# Patient Record
Sex: Female | Born: 1940 | Race: White | Hispanic: No | Smoking: Current every day smoker
Health system: Southern US, Community
[De-identification: ages and names within clinical notes are randomized; demographics above are authoritative.]

## PROBLEM LIST (undated history)

## (undated) ENCOUNTER — Ambulatory Visit (HOSPITAL_COMMUNITY): Admission: EM | Source: Home / Self Care

## (undated) DIAGNOSIS — C679 Malignant neoplasm of bladder, unspecified: Secondary | ICD-10-CM

## (undated) DIAGNOSIS — I10 Essential (primary) hypertension: Secondary | ICD-10-CM

## (undated) DIAGNOSIS — E039 Hypothyroidism, unspecified: Secondary | ICD-10-CM

## (undated) DIAGNOSIS — C801 Malignant (primary) neoplasm, unspecified: Secondary | ICD-10-CM

## (undated) DIAGNOSIS — N814 Uterovaginal prolapse, unspecified: Secondary | ICD-10-CM

## (undated) DIAGNOSIS — E7800 Pure hypercholesterolemia, unspecified: Secondary | ICD-10-CM

## (undated) DIAGNOSIS — R011 Cardiac murmur, unspecified: Secondary | ICD-10-CM

## (undated) DIAGNOSIS — I350 Nonrheumatic aortic (valve) stenosis: Secondary | ICD-10-CM

## (undated) DIAGNOSIS — K219 Gastro-esophageal reflux disease without esophagitis: Secondary | ICD-10-CM

## (undated) DIAGNOSIS — J449 Chronic obstructive pulmonary disease, unspecified: Secondary | ICD-10-CM

## (undated) HISTORY — PX: VAGINAL HYSTERECTOMY: SUR661

## (undated) HISTORY — DX: Pure hypercholesterolemia, unspecified: E78.00

## (undated) HISTORY — DX: Malignant (primary) neoplasm, unspecified: C80.1

## (undated) HISTORY — DX: Essential (primary) hypertension: I10

## (undated) HISTORY — PX: BLADDER TUMOR EXCISION: SHX238

## (undated) HISTORY — PX: BREAST EXCISIONAL BIOPSY: SUR124

## (undated) HISTORY — DX: Uterovaginal prolapse, unspecified: N81.4

---

## 1998-05-09 ENCOUNTER — Ambulatory Visit (HOSPITAL_COMMUNITY): Admission: RE | Admit: 1998-05-09 | Discharge: 1998-05-09 | Payer: Self-pay | Admitting: Gastroenterology

## 1999-09-11 ENCOUNTER — Other Ambulatory Visit: Admission: RE | Admit: 1999-09-11 | Discharge: 1999-09-11 | Payer: Self-pay | Admitting: Obstetrics and Gynecology

## 2000-04-13 ENCOUNTER — Encounter: Payer: Self-pay | Admitting: *Deleted

## 2000-04-13 ENCOUNTER — Ambulatory Visit (HOSPITAL_COMMUNITY): Admission: RE | Admit: 2000-04-13 | Discharge: 2000-04-13 | Payer: Self-pay | Admitting: *Deleted

## 2000-04-16 ENCOUNTER — Encounter: Payer: Self-pay | Admitting: Obstetrics and Gynecology

## 2000-04-16 ENCOUNTER — Ambulatory Visit (HOSPITAL_COMMUNITY): Admission: RE | Admit: 2000-04-16 | Discharge: 2000-04-16 | Payer: Self-pay | Admitting: Obstetrics and Gynecology

## 2001-08-17 ENCOUNTER — Encounter: Payer: Self-pay | Admitting: Urology

## 2001-08-17 ENCOUNTER — Encounter: Admission: RE | Admit: 2001-08-17 | Discharge: 2001-08-17 | Payer: Self-pay | Admitting: Urology

## 2001-08-19 ENCOUNTER — Encounter: Payer: Self-pay | Admitting: Urology

## 2001-08-19 ENCOUNTER — Ambulatory Visit (HOSPITAL_COMMUNITY): Admission: RE | Admit: 2001-08-19 | Discharge: 2001-08-19 | Payer: Self-pay | Admitting: Urology

## 2001-08-23 ENCOUNTER — Encounter: Payer: Self-pay | Admitting: Urology

## 2001-09-01 ENCOUNTER — Encounter (INDEPENDENT_AMBULATORY_CARE_PROVIDER_SITE_OTHER): Payer: Self-pay | Admitting: Specialist

## 2001-09-01 ENCOUNTER — Inpatient Hospital Stay (HOSPITAL_COMMUNITY): Admission: RE | Admit: 2001-09-01 | Discharge: 2001-09-03 | Payer: Self-pay | Admitting: Urology

## 2001-09-15 ENCOUNTER — Ambulatory Visit (HOSPITAL_COMMUNITY): Admission: RE | Admit: 2001-09-15 | Discharge: 2001-09-15 | Payer: Self-pay | Admitting: Obstetrics and Gynecology

## 2001-09-15 ENCOUNTER — Encounter: Payer: Self-pay | Admitting: Obstetrics and Gynecology

## 2001-10-30 ENCOUNTER — Emergency Department (HOSPITAL_COMMUNITY): Admission: EM | Admit: 2001-10-30 | Discharge: 2001-10-30 | Payer: Self-pay | Admitting: Emergency Medicine

## 2001-10-30 ENCOUNTER — Encounter: Payer: Self-pay | Admitting: Urology

## 2002-01-23 ENCOUNTER — Other Ambulatory Visit: Admission: RE | Admit: 2002-01-23 | Discharge: 2002-01-23 | Payer: Self-pay | Admitting: Obstetrics and Gynecology

## 2002-02-09 ENCOUNTER — Encounter: Payer: Self-pay | Admitting: Gastroenterology

## 2002-02-09 ENCOUNTER — Encounter: Admission: RE | Admit: 2002-02-09 | Discharge: 2002-02-09 | Payer: Self-pay | Admitting: Gastroenterology

## 2002-02-10 ENCOUNTER — Ambulatory Visit (HOSPITAL_COMMUNITY): Admission: RE | Admit: 2002-02-10 | Discharge: 2002-02-10 | Payer: Self-pay | Admitting: Gastroenterology

## 2007-08-16 ENCOUNTER — Other Ambulatory Visit: Admission: RE | Admit: 2007-08-16 | Discharge: 2007-08-16 | Payer: Self-pay | Admitting: Obstetrics and Gynecology

## 2007-09-06 ENCOUNTER — Encounter: Admission: RE | Admit: 2007-09-06 | Discharge: 2007-09-06 | Payer: Self-pay | Admitting: Gastroenterology

## 2007-09-28 ENCOUNTER — Encounter: Admission: RE | Admit: 2007-09-28 | Discharge: 2007-09-28 | Payer: Self-pay | Admitting: Obstetrics and Gynecology

## 2008-09-18 ENCOUNTER — Ambulatory Visit: Payer: Self-pay | Admitting: Obstetrics and Gynecology

## 2008-09-19 ENCOUNTER — Ambulatory Visit: Payer: Self-pay | Admitting: Obstetrics and Gynecology

## 2008-09-28 ENCOUNTER — Encounter: Admission: RE | Admit: 2008-09-28 | Discharge: 2008-09-28 | Payer: Self-pay | Admitting: Obstetrics and Gynecology

## 2008-11-16 ENCOUNTER — Encounter: Admission: RE | Admit: 2008-11-16 | Discharge: 2008-11-16 | Payer: Self-pay | Admitting: Orthopedic Surgery

## 2008-12-11 ENCOUNTER — Ambulatory Visit (HOSPITAL_COMMUNITY): Admission: RE | Admit: 2008-12-11 | Discharge: 2008-12-11 | Payer: Self-pay | Admitting: Urology

## 2008-12-19 ENCOUNTER — Ambulatory Visit: Payer: Self-pay | Admitting: Obstetrics and Gynecology

## 2008-12-28 ENCOUNTER — Ambulatory Visit (HOSPITAL_BASED_OUTPATIENT_CLINIC_OR_DEPARTMENT_OTHER): Admission: RE | Admit: 2008-12-28 | Discharge: 2008-12-28 | Payer: Self-pay | Admitting: Urology

## 2009-09-19 ENCOUNTER — Encounter: Payer: Self-pay | Admitting: Obstetrics and Gynecology

## 2009-09-19 ENCOUNTER — Ambulatory Visit: Payer: Self-pay | Admitting: Obstetrics and Gynecology

## 2009-09-19 ENCOUNTER — Other Ambulatory Visit: Admission: RE | Admit: 2009-09-19 | Discharge: 2009-09-19 | Payer: Self-pay | Admitting: Obstetrics and Gynecology

## 2009-10-10 ENCOUNTER — Encounter: Admission: RE | Admit: 2009-10-10 | Discharge: 2009-10-10 | Payer: Self-pay | Admitting: Obstetrics and Gynecology

## 2010-09-11 ENCOUNTER — Ambulatory Visit (HOSPITAL_COMMUNITY): Admission: RE | Admit: 2010-09-11 | Discharge: 2010-09-11 | Payer: Self-pay | Admitting: Gastroenterology

## 2010-09-23 ENCOUNTER — Ambulatory Visit: Payer: Self-pay | Admitting: Obstetrics and Gynecology

## 2010-10-14 ENCOUNTER — Encounter
Admission: RE | Admit: 2010-10-14 | Discharge: 2010-10-14 | Payer: Self-pay | Source: Home / Self Care | Admitting: Obstetrics and Gynecology

## 2010-11-17 ENCOUNTER — Ambulatory Visit
Admission: RE | Admit: 2010-11-17 | Discharge: 2010-11-17 | Payer: Self-pay | Source: Home / Self Care | Attending: Obstetrics and Gynecology | Admitting: Obstetrics and Gynecology

## 2010-11-30 ENCOUNTER — Encounter: Payer: Self-pay | Admitting: Obstetrics and Gynecology

## 2010-12-02 ENCOUNTER — Ambulatory Visit (HOSPITAL_COMMUNITY)
Admission: RE | Admit: 2010-12-02 | Discharge: 2010-12-02 | Payer: Self-pay | Source: Home / Self Care | Attending: Obstetrics and Gynecology | Admitting: Obstetrics and Gynecology

## 2011-02-24 LAB — POCT I-STAT 4, (NA,K, GLUC, HGB,HCT)
Glucose, Bld: 88 mg/dL (ref 70–99)
Hemoglobin: 18 g/dL — ABNORMAL HIGH (ref 12.0–15.0)
Potassium: 3.9 mEq/L (ref 3.5–5.1)

## 2011-03-24 NOTE — Op Note (Signed)
NAMENORBERTA, STOBAUGH NO.:  [id number]   MEDICAL RECORD NO.:  [id number]          PATIENT TYPE:  OUT   LOCATION:  XRAY                         FACILITY:  [facility]   PHYSICIAN:  Sigmund I. Patsi Sears, M.D.DATE OF BIRTH:  15-May-1941   DATE OF PROCEDURE:  12/28/2008  DATE OF DISCHARGE:  12/11/2008                               OPERATIVE REPORT   PREOPERATIVE DIAGNOSIS:  Left ureteral obstruction.   POSTOPERATIVE DIAGNOSIS:  Left ureteral obstruction.   OPERATION:  Cystourethroscopy, cystogram, right retrograde pyelogram  with interpretation, injection of indigo carmine and Lasix.  Attempted  left ureteral catheterization.   SURGEON:  Dr. Patsi Sears.   ANESTHESIA:  General LMA.   PREPARATION:  After appropriate preanesthesia, the patient was brought  to the operating room and placed on the operating room table in the  dorsal supine position where general LMA anesthesia was introduced.  She  was then replaced in the dorsal lithotomy position where the pubis was  prepped with Betadine solution and draped in the usual fashion.   REVIEW OF HISTORY:  Mrs. 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  She  was treated postoperatively with Intron/BCG, to which she responded  well.  She has had yearly cystoscopies with no recurrence.  She has also  been treated for osteoporosis, low vitamin D levels as well.  The  patient recently has been seen by orthopedic surgery complaining of left  flank pain, with CT scan showing mild left hydronephrosis.  There was no  evidence of a stone or mass.  The patient had nuclear medicine renal  imaging, which showed mild distal ureteral drainage, but otherwise  normal kidneys.  She is now for cystoscopy and left retrograde  pyelogram.   DESCRIPTION OF PROCEDURE:  Cystogram was performed to rule out reflux,  and no evidence of reflux ws identified.  Cystoscopy was then  performed,  which showed no evidence of recurrent bladder tumor or bladder stone.  Right ureteral orifice identified, and right retrograde pyelogram was  performed which showed normal right ureter and normal renal pelvis.  However, I could not find the left ureteral orifice.  Indigo carmine was  given, Lasix was given, and over a 15-minute period cystoscopy was  accomplished, but no blue contrast was identified.  I was not able to  identify the left ureteral orifice with the 12 degrees lens, the 30  degrees lens, or the 70 degrees lens.  Therefore, I terminated the  procedure.  The patient will have a follow-up ultrasound, and possible  antegrade nephrostogram.  The patient was awakened and taken to the  recovery room in good condition.     Sigmund I. Patsi Sears, M.D.  Electronically Signed    SIT/MEDQ  D:  12/28/2008  T:  12/28/2008  Job:  [id number]

## 2011-03-27 NOTE — Discharge Summary (Signed)
[facility]  Patient:    Gina Harrell, Gina Harrell Visit Number: [id number] MRN: [id number]          Service Type: SUR Location: 3W 0348 01 Attending Physician:  Laqueta Jean Dictated by:   Vonzell Schlatter Patsi Sears, M.D. Admit Date:  09/01/2001 Discharge Date: 09/03/2001   CC:         Genene Churn. Sherin Quarry, M.D.   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  OPERATION:  10/24, cystourethroscopy, transurethral resection of large volume multiple bladder tumors.  ADDITIONAL DIAGNOSES: 1. History of tobacco abuse. 2. History of hysterectomy in 1986. 3. History of benign liver lesion.  HISTORY OF PRESENT ILLNESS:  [name] is a 70 year old single white female, para 3-3-0, seen on October 8 with a fourth episode of gross painless hematuria.  Cystoscopy showed large volume of bladder cancer.  CT scan showed an abnormal liver lesion, which was identified approximately 15 years ago by Dr. Sherin Quarry and Dr. Prudy Feeler.  Urine cytologies were negative. The patient is admitted after TUR of bladder tumor.  PHYSICAL EXAMINATION:  GENERAL:  A well-developed, well-nourished white female in no acute distress.  VITAL SIGNS:  Temperature 98.3, heart rate 61, respiratory rate 16, blood pressure 120/86, weight 101 pounds.  Remaining physical examination is as noted on H&P dictated on October 24.  ADMISSION LABORATORY DATA:  A white blood cell count of 8900, hemoglobin 15.3, hematocrit 43.8.  Serum sodium 137, potassium 4.2, chloride 106, CO2 25, BUN 18, creatinine 1.0.  Liver functions are normal.  Chest x-ray is negative. EKG shows normal sinus rhythm with left atrial enlargement.  HOSPITAL COURSE:  On the day of admission, the patient underwent transurethral resection of the prostate.  Because of her smoking history, she was placed on nicotine patches and Xanax for high  anxiety.  Patient desires to stay in the hospital until she is able to void; therefore, the Foley catheter is left in for 48 hours.  This will be removed, the patient will be allowed to be discharged following her urination.  She will be discharged on Urised one to two three times a day as she needs it for burning and spasm, Vicodin for pain, and Septra antibiotic.  She will be discharged in stable condition. Dictated by:   Vonzell Schlatter Patsi Sears, M.D. Attending Physician:  Laqueta Jean DD:  09/02/01 TD:  09/04/01 Job: [id number]

## 2011-03-27 NOTE — Op Note (Signed)
[facility]  Patient:    Gina Harrell, Gina Harrell Visit Number: [id number] MRN: [id number]          Service Type: SUR Location: 3W 0348 01 Attending Physician:  Lurene Shadow. Date: 09/01/01 Admit Date:  09/01/2001   CC:         Genene Churn. Sherin Quarry, M.D.   Operative Report  PREOPERATIVE DIAGNOSIS:  Bladder cancer.  POSTOPERATIVE DIAGNOSIS:  Bladder cancer.  OPERATION:  Cystourethroscopy, cold cup bladder biopsy, transurethral resection bladder tumor.  SURGEON:  Sigmund I. Patsi Sears, M.D.  ANESTHESIA:  General endotracheal.  PREPARATION:  After appropriate preanesthesia, the patient is brought to the operating room and placed upon the operating table in the dorsal supine position where a general endotracheal anesthesia was introduced.  She was then re-placed in the dorsal lithotomy position where the pubis was prepped with Betadine solution and draped in the usual fashion.  DESCRIPTION OF PROCEDURE:  Cystoscopy was accomplished; photodocumentation of tumors was accomplished, and cold cup bladder biopsies were taken.  There were two large 2 cm bladder tumors at the bladder base, and these were cold cup biopsied.  They were then multiple satellite tumors on the left lateral bladder base wall, and these were cauterized.  A very large bladder tumor was identified, completely encompassing the bladder neck from the 12 oclock position, around the 3 oclock position, to the 6 oclock position.  This was in greatest quantity at the 3 oclock position.  Using a regular resectoscope, as well as a special bladder wall loop, the tumor was resected.  The tumor definitely was into the urethra, on the left side, between 12 and 3 oclock. Following resection, cauterization of all areas was accomplished.  She was given indigo carmine, but no blue urine was seen by the end of the case.  It will be evaluated later.  Following this, a 24 three-way Foley  catheter was placed.  All chips were evacuated free from the bladder and continuous flow irrigation was required to keep the urine relatively clear.  Repeat cystoscopy was accomplished, and repeat cauterization was accomplished.  No large bleeding was identified. Again, a 24 three-way Foley catheter was placed with 30 cc in the balloon. The patient was then awakened and taken to the recovery room in good condition. Attending Physician:  Laqueta Jean DD:  09/01/01 TD:  09/02/01 Job: [id number]

## 2011-03-27 NOTE — Consult Note (Signed)
[facility]  Patient:    Gina Harrell, Gina Harrell Visit Number: [id number] MRN: [id number]          Service Type: EMS Location: ED Attending Physician:  Shelba Flake Dictated by:   Vonzell Schlatter Patsi Sears, M.D. Proc. Date: 10/30/01 Admit Date:  10/30/2001 Discharge Date: 10/30/2001   CC:         Genene Churn. Sherin Quarry, M.D.                          Consultation Report  SUBJECTIVE:  A 70 year old divorced white female with large volume non-invasive TCC of the bladder currently on combination research protocol with BCG and IL2.  Has complained of left-sided chest discomfort for a one week period.  Today the patient developed worsening of her left chest pain with shortness of breath.  There was no hemoptysis, no fever, no chills, no back pain, no flank pain.  The patient has a past history of 70 pack year history of smoking (currently is smoking at a much lower rate).  Her past history is otherwise noncontributory.  SOCIAL HISTORY:  The patient was recently fired from her job due to her illness and currently has had her unemployment challenged by her employer. She has had much anxiety over this.  OBJECTIVE:  CHEST:  The patient has clear chest with excellent breath sounds and normal chest excursion.  There is no rib tenderness.  There is pain underneath the left twelfth rib in the left upper quadrant.  The pain does not radiate and worsens with palpation.  Breath sounds are equal bilaterally.  COR:  S1, S2 normal without heaves, thrills, or murmurs.  ABDOMEN:  Soft, positive bowel sounds without organomegaly or masses.  No CVA pain.  No abdominal or pelvic pain.  LABORATORIES:  Blood gas studies on room air:  pH 7.39, O2 91, CO2 34.7, bicarbonate 20, CO2 21, O2 saturation 97%.  White blood cell count 7900, hemoglobin 13.8, hematocrit 40.0, platelet count 252,000.  Sodium 138, potassium 3.8, chloride 113, CO2 23, BUN 15, creatinine 0.8.  Liver  function tests within normal limits.  Glucose 92, calcium 8.9, albumin 4.3, total protein 7.1.  Chest CT is negative for acute PE.  Chest x-ray shows some hypervascularity consistent with tobacco use ______ COPD, but no acute changes.  EKG results are pending.  IMPRESSION:  Left lower sternal pain and pain just beneath the twelfth rib with coincidental shortness of breath today (?anxiety attack).  PLAN:  Patient is cleared for bladder wash chemotherapy tomorrow.  Will send a copy of this information to Dr. Sherin Quarry.  She will notify me or Dr. Sherin Quarry should her symptoms reoccur or worsen. Dictated by:   Vonzell Schlatter Patsi Sears, M.D. Attending Physician:  Shelba Flake DD:  10/30/01 TD:  10/31/01 Job: [id number]

## 2011-03-27 NOTE — H&P (Signed)
[facility]  Patient:    Gina Harrell, Gina Harrell Visit Number: [id number] MRN: [id number]          Service Type: SUR Location: 3W 0348 01 Attending Physician:  Laqueta Jean Dictated by:   Vonzell Schlatter Patsi Sears, M.D. Admit Date:  09/01/2001                           History and Physical  HISTORY OF PRESENT ILLNESS:  Ms. Flener is a 70 year old, single, white female, para 3-3-0, who was originally seen on August 16, 2001, because of her fourth episode of gross, painless hematuria.  The episodes began two months prior to being seen.  There was no history of kidney stones and the patient has absolutely no symptoms, except for occasional severe headache secondary to sinus infection.  Her review of systems is significant for occasional left temporal pain radiating to her left eye.  Cystoscopy was accomplished in the office, which showed a very large bladder tumor at the 12 oclock position just inside her bladder neck, as well as two large lesions at the base of the bladder.  PAST SURGICAL HISTORY:  Hysterectomy in 1996.  ALLERGIES:  None known.  SOCIAL HISTORY:  Tobacco:  A 66-pack-year history.  A CT and MR were accomplished, showing that the patient had a large amount of tumor in the bladder and possible tumor in the urethra as well.  A question of nodes was raised on the left lateral side, but could not be documented.  The liver had cysts and MR confirmation of cysts.  There was one area that could not be completely confirmed with a possible hemangioma.  It is noted that the patient had abnormality in the same location approximately 16 years ago.  This was evaluated by Elana Alm. 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  PHYSICAL EXAMINATION:  A thin white female in no acute distress.  VITAL SIGNS:  Blood pressure 164/80, pulse 72, respiratory rate 16.  HEENT:  PERRL.  EOM full.  NECK:  Supple, nontender.  No nodes.  CHEST:   Clear to P&A.  BREASTS:  Not indicated.  ABDOMEN:  Soft.  Positive bowel sounds.  Without organomegaly.  Without masses.  PELVIC:  Normal female BUS.  The vaginal examination shows no palpable masses. There is no uterus present.  There are no adnexal masses.  RECTAL:  Normal external rectum.  EXTREMITIES:  Without cyanosis or edema.  NEUROLOGIC:  Physiologic.  ADMITTING IMPRESSION:  Bladder cancer.  PLAN:  Admit via OR for transurethral resection of bladder tumor. Dictated by:   Vonzell Schlatter Patsi Sears, M.D. Attending Physician:  Laqueta Jean DD:  09/01/01 TD:  09/02/01 Job: [id number]

## 2011-07-22 ENCOUNTER — Ambulatory Visit
Admission: RE | Admit: 2011-07-22 | Discharge: 2011-07-22 | Disposition: A | Discharge status: Home / Self Care | Payer: MEDICARE | Source: Ambulatory Visit | Attending: Internal Medicine | Admitting: Internal Medicine

## 2011-07-22 ENCOUNTER — Other Ambulatory Visit: Payer: Self-pay | Admitting: Internal Medicine

## 2011-07-22 DIAGNOSIS — R1031 Right lower quadrant pain: Secondary | ICD-10-CM

## 2011-07-22 MED ORDER — IOHEXOL 300 MG/ML  SOLN: 100.0000 mL | Freq: Once | INTRAMUSCULAR | Status: AC | PRN

## 2011-09-28 ENCOUNTER — Encounter: Payer: Self-pay | Admitting: Gynecology

## 2011-09-28 DIAGNOSIS — C801 Malignant (primary) neoplasm, unspecified: Secondary | ICD-10-CM | POA: Insufficient documentation

## 2011-09-28 DIAGNOSIS — N814 Uterovaginal prolapse, unspecified: Secondary | ICD-10-CM | POA: Insufficient documentation

## 2011-09-28 DIAGNOSIS — Z8551 Personal history of malignant neoplasm of bladder: Secondary | ICD-10-CM | POA: Insufficient documentation

## 2011-10-07 ENCOUNTER — Encounter: Payer: Self-pay | Admitting: Obstetrics and Gynecology

## 2011-10-07 ENCOUNTER — Other Ambulatory Visit (HOSPITAL_COMMUNITY)
Admission: RE | Admit: 2011-10-07 | Discharge: 2011-10-07 | Disposition: A | Discharge status: Home / Self Care | Payer: Medicare Other | Source: Ambulatory Visit | Attending: Obstetrics and Gynecology | Admitting: Obstetrics and Gynecology

## 2011-10-07 ENCOUNTER — Ambulatory Visit (INDEPENDENT_AMBULATORY_CARE_PROVIDER_SITE_OTHER): Payer: Medicare Other | Admitting: Obstetrics and Gynecology

## 2011-10-07 VITALS — BP 120/78 | Ht 60.0 in | Wt 116.0 lb

## 2011-10-07 DIAGNOSIS — Z124 Encounter for screening for malignant neoplasm of cervix: Secondary | ICD-10-CM

## 2011-10-07 DIAGNOSIS — Z01419 Encounter for gynecological examination (general) (routine) without abnormal findings: Secondary | ICD-10-CM | POA: Insufficient documentation

## 2011-10-07 DIAGNOSIS — M810 Age-related osteoporosis without current pathological fracture: Secondary | ICD-10-CM

## 2011-10-07 DIAGNOSIS — N949 Unspecified condition associated with female genital organs and menstrual cycle: Secondary | ICD-10-CM

## 2011-10-07 DIAGNOSIS — N952 Postmenopausal atrophic vaginitis: Secondary | ICD-10-CM

## 2011-10-07 DIAGNOSIS — R102 Pelvic and perineal pain: Secondary | ICD-10-CM

## 2011-10-07 DIAGNOSIS — C679 Malignant neoplasm of bladder, unspecified: Secondary | ICD-10-CM

## 2011-10-07 MED ORDER — ERGOCALCIFEROL 1.25 MG (50000 UT) PO CAPS: 50000.0000 [IU] | ORAL_CAPSULE | ORAL | Status: DC

## 2011-10-07 NOTE — Progress Notes (Signed)
Subjective:     Patient ID: Gina Harrell, female   DOB: 1941-08-11, 70 y.o.   MRN: [id number]  HPIpatient came back to see me today for further followup. Earlier this year she had been complaining of right lower quadrant pain. Her PCP order a CT scan of her abdomen and pelvis. It did not show any significant problems. It comments that she had normal ovaries. The pain is actually gone away. It appeared to be related to constipation. The patient has been treated by me for her osteoporosis. She has had one injection of Reclast. She is due for her next one in January. She had no side effects from the injection. She had been followed since then and had no fracture. She fell once and did not fracture. She continues her calcium and vitamin D. She is significantly reduced her smoking. She is having no vaginal bleeding. She will schedule her mammogram for December. She is cancer free from her bladder cancer. She has vaginal dryness but is not sexually active. It is not require intervention.  Review of Systems  Constitutional: Negative.   HENT: Negative.   Eyes: Negative.   Respiratory: Negative.   Cardiovascular: Negative.   Gastrointestinal: Positive for abdominal pain and constipation.  Genitourinary:       Bladder cancer with 10 years of no recurrence.  Musculoskeletal: Negative.   Skin: Negative.   Neurological: Negative.   Hematological: Negative.   Psychiatric/Behavioral: Negative.        Objective:   Physical ExamHEENT: Within normal limits.Kennon Portela present Neck: No masses. Supraclavicular lymph nodes: Not enlarged. Breasts: Examined in both sitting and lying position. Symmetrical without skin changes or masses. Abdomen: Soft no masses guarding or rebound. No hernias. Pelvic: External within normal limits. BUS within normal limits. Vaginal examination shows poor estrogen effect, no cystocele enterocele or rectocele. Cervix and uterus absent. Adnexa within normal  limits. Rectovaginal confirmatory. Extremities within normal limits.      Assessment:     #1. Osteoporosis #2. Bladder cancer #3. Atrophic vaginitis #4. Abdominal pain   Plan:     Continue IV Reclast yearly. Continue calcium and vitamin D. Continue yearly mammograms.

## 2011-10-08 ENCOUNTER — Other Ambulatory Visit: Payer: Self-pay | Admitting: Obstetrics and Gynecology

## 2011-10-08 DIAGNOSIS — Z1231 Encounter for screening mammogram for malignant neoplasm of breast: Secondary | ICD-10-CM

## 2011-11-04 ENCOUNTER — Ambulatory Visit
Admission: RE | Admit: 2011-11-04 | Discharge: 2011-11-04 | Disposition: A | Discharge status: Home / Self Care | Payer: Medicare Other | Source: Ambulatory Visit | Attending: Obstetrics and Gynecology | Admitting: Obstetrics and Gynecology

## 2011-11-04 DIAGNOSIS — Z1231 Encounter for screening mammogram for malignant neoplasm of breast: Secondary | ICD-10-CM

## 2011-11-10 HISTORY — PX: OTHER SURGICAL HISTORY: SHX169

## 2011-11-10 HISTORY — PX: DOPPLER ECHOCARDIOGRAPHY: SHX263

## 2012-02-26 ENCOUNTER — Telehealth: Payer: Self-pay | Admitting: *Deleted

## 2012-02-26 NOTE — Telephone Encounter (Signed)
Lm for patient to call.  Benefits Reclast $200.

## 2012-02-29 ENCOUNTER — Other Ambulatory Visit: Payer: Self-pay | Admitting: *Deleted

## 2012-02-29 DIAGNOSIS — M810 Age-related osteoporosis without current pathological fracture: Secondary | ICD-10-CM

## 2012-02-29 NOTE — Telephone Encounter (Signed)
Patient informed benefits was actually $400 and patient ok with that.  Will do labs on 03/09/12 and we will schedule reclast according.

## 2012-03-09 ENCOUNTER — Other Ambulatory Visit: Payer: Medicare Other

## 2012-03-09 DIAGNOSIS — M810 Age-related osteoporosis without current pathological fracture: Secondary | ICD-10-CM

## 2012-03-09 LAB — CALCIUM: Calcium: 9.9 mg/dL (ref 8.4–10.5)

## 2012-03-16 NOTE — Telephone Encounter (Signed)
Patient set up for Reclast on May 22nd. Order faxed and labs faxed to Dr. Jacky Kindle.

## 2012-03-30 ENCOUNTER — Encounter (HOSPITAL_COMMUNITY)
Admission: RE | Admit: 2012-03-30 | Discharge: 2012-03-30 | Disposition: A | Discharge status: Home / Self Care | Payer: Medicare Other | Source: Ambulatory Visit | Attending: Obstetrics and Gynecology | Admitting: Obstetrics and Gynecology

## 2012-03-30 DIAGNOSIS — M810 Age-related osteoporosis without current pathological fracture: Secondary | ICD-10-CM | POA: Insufficient documentation

## 2012-03-30 MED ORDER — ZOLEDRONIC ACID 5 MG/100ML IV SOLN
5.0000 mg | Freq: Once | INTRAVENOUS | Status: AC
  Administered 2012-03-30: 5 mg via INTRAVENOUS
  Filled 2012-03-30: qty 100

## 2012-06-05 ENCOUNTER — Ambulatory Visit (INDEPENDENT_AMBULATORY_CARE_PROVIDER_SITE_OTHER): Payer: Medicare Other | Admitting: Internal Medicine

## 2012-06-05 VITALS — BP 140/86 | HR 69 | Temp 97.7°F | Resp 16 | Ht 59.75 in | Wt 117.0 lb

## 2012-06-05 DIAGNOSIS — I10 Essential (primary) hypertension: Secondary | ICD-10-CM | POA: Insufficient documentation

## 2012-06-05 DIAGNOSIS — H00039 Abscess of eyelid unspecified eye, unspecified eyelid: Secondary | ICD-10-CM

## 2012-06-05 MED ORDER — ACYCLOVIR 400 MG PO TABS: 400.0000 mg | ORAL_TABLET | ORAL | Status: AC

## 2012-06-05 MED ORDER — PREDNISONE 20 MG PO TABS: ORAL_TABLET | ORAL | Status: DC

## 2012-06-05 MED ORDER — DOXYCYCLINE HYCLATE 100 MG PO TABS: 100.0000 mg | ORAL_TABLET | Freq: Two times a day (BID) | ORAL | Status: AC

## 2012-06-05 NOTE — Progress Notes (Signed)
Subjective:    Patient ID: Gina Harrell, female    DOB: Oct 05, 1941, 71 y.o.   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 Uterine prolapse  . Cancer  . Osteoporosis  . HTN (hypertension)    -  Hyperlipidemia  On Mevacor and Maxzide Also on alprazolam when necessary  Review of SystemsNo fever chills or night sweats No oral pharyngeal or nasal symptoms     Objective:   Physical Exam Vital signs stable The right sits redness and significant swelling including lid and the malar area There is an early blister form as part of this/lower lid has several small spots that could be early blisters No pus No hordeolum No regional adenopathy Nose and throat are clear Conjunctiva is not injected There is no paresthesia found in the distribution of the trigeminal nerve on the right  Procedure= after Ophthaine and stained the conjunctiva was was found to be normal No areas of uptake and no dendritic changes          Assessment & Plan:  Problem #1 Cellulitis and swelling of lower lid and malar area  Differential diagnosis includes contact dermatitis, bacterial cellulitis, primary HSV infection, and zoster.  Viral culture Meds ordered this encounter  Medications         . predniSONE (DELTASONE) 20 MG tablet    Sig: 3/3/2/2/1/1 Single daily dose for 6 days    Dispense:  12 tablet    Refill:  0  . doxycycline (VIBRA-TABS) 100 MG tablet    Sig: Take 1 tablet (100 mg total) by mouth 2 (two) times daily.    Dispense:  20 tablet    Refill:  0  . acyclovir (ZOVIRAX) 400 MG tablet    Sig: Take 1 tablet (400 mg total) by mouth every 4 (four) hours while awake. 5 times a day    Dispense:  20  tablet    Refill:  0   Call with culture results/ophthalmology examination if not responding/followup here 24-48 hours

## 2012-06-08 ENCOUNTER — Telehealth: Payer: Self-pay | Admitting: *Deleted

## 2012-06-08 NOTE — Telephone Encounter (Signed)
Pt was calling in regards to her results of culture advised pt we only had a preliminary. She states that the medicines are giving her a severe headache.  Please advise.

## 2012-06-08 NOTE — Telephone Encounter (Signed)
Her headache may be coming from her eye infection If she is responding to treatment she should stop the doxycycline for 24 hours to see if the headache is due to that We can recheck her again Friday -not sure what my shift is

## 2012-06-09 NOTE — Telephone Encounter (Signed)
Pt reports that she has one more pred left and the rest of the Abx. Her eye has improved, but not 100% yet. Gave her instr's to DC doxy for 24 hrs and see if that is what was causing HA and to let us know if HA doesn't resolve and/or if eye does not continue to improve. Advised her that Dr Merla Riches can recheck her on Fri and she stated that she would like to try this first and see what happens, but will CB or RTC if needed.

## 2012-10-12 ENCOUNTER — Ambulatory Visit (INDEPENDENT_AMBULATORY_CARE_PROVIDER_SITE_OTHER): Payer: Medicare Other | Admitting: Obstetrics and Gynecology

## 2012-10-12 ENCOUNTER — Encounter: Payer: Self-pay | Admitting: Obstetrics and Gynecology

## 2012-10-12 VITALS — BP 120/78 | Ht 60.0 in | Wt 116.0 lb

## 2012-10-12 DIAGNOSIS — N952 Postmenopausal atrophic vaginitis: Secondary | ICD-10-CM

## 2012-10-12 DIAGNOSIS — N6019 Diffuse cystic mastopathy of unspecified breast: Secondary | ICD-10-CM

## 2012-10-12 DIAGNOSIS — E559 Vitamin D deficiency, unspecified: Secondary | ICD-10-CM

## 2012-10-12 DIAGNOSIS — C679 Malignant neoplasm of bladder, unspecified: Secondary | ICD-10-CM

## 2012-10-12 DIAGNOSIS — M810 Age-related osteoporosis without current pathological fracture: Secondary | ICD-10-CM

## 2012-10-12 NOTE — Progress Notes (Signed)
The patient came back today for further followup. She has been treated by [place] for osteoporosis. She first did 2 years of IV Reclast. She then took oral medicine for  6 months. She then did Zometa for 2 additional years. She has had no fractures. Her bone densities showed tremendous improvement. It has been one year since her last injection. She will go on drug holiday now because she requires significant dental work. The dentist  would like her to be off medication during the treatment. It sounds extensive it may take a year or more to complete. She continues on calcium and vitamin D 50,000 IUs every 14 days for vitamin D deficiency. Her D. Level and other labs are done through her PCP. The patient is not sexually active. She does have atrophic vaginitis but is asymptomatic. She had a vaginal hysterectomy in 1985 for uterine prolapse. She has always had normal Pap smears. She had a Pap smear in 2012. She is getting ready to schedule a mammogram. She does have dense breast tissue on mammogram. She is now 11 years since her diagnosis of bladder cancer. She is now on two-year followup. She is having no vaginal bleeding. She is having no pelvic pain.  ROS: 12 system review done. Pertinent positives above. Other positive is hypertension.  HEENT: Within normal limits.Kennon Portela present. Neck: No masses. Supraclavicular lymph nodes: Not enlarged. Breasts: Examined in both sitting and lying position. Symmetrical without skin changes or masses. Abdomen: Soft no masses guarding or rebound. No hernias. Pelvic: External within normal limits. BUS within normal limits. Vaginal examination shows poor  estrogen effect, no cystocele enterocele or rectocele. Cervix and uterus absent. Adnexa within normal limits. Rectovaginal confirmatory. Extremities within normal limits.  Assessment: #1. Osteoporosis #2. Bladder cancer #3. Atrophic vaginitis #4. Vitamin D deficiency #5. Dense breast tissue on mammogram.  Plan: We  had a long discussion of what to do medication wise for her osteoporosis. She certainly has had significant treatment. She certainly is at much less risk now. She will finish her dental work and get a followup bone density 2 years after her last IV Zometa. A decision can be made about further treatment then. She will get a 3-D mammogram because of dense breast tissue. She will continue her vitamin D. She will continue bladder cancer followup with Dr. Patsi Sears. Pap not done.The new Pap smear guidelines were discussed with the patient.

## 2012-10-12 NOTE — Patient Instructions (Signed)
Schedule 3-D mammogram 

## 2013-01-11 ENCOUNTER — Other Ambulatory Visit: Payer: Self-pay

## 2013-01-11 DIAGNOSIS — Z1231 Encounter for screening mammogram for malignant neoplasm of breast: Secondary | ICD-10-CM

## 2013-02-15 ENCOUNTER — Ambulatory Visit
Admission: RE | Admit: 2013-02-15 | Discharge: 2013-02-15 | Disposition: A | Discharge status: Home / Self Care | Payer: Medicare Other | Source: Ambulatory Visit

## 2013-02-15 DIAGNOSIS — Z1231 Encounter for screening mammogram for malignant neoplasm of breast: Secondary | ICD-10-CM

## 2013-09-15 ENCOUNTER — Ambulatory Visit: Payer: Medicare Other

## 2013-09-15 ENCOUNTER — Ambulatory Visit (INDEPENDENT_AMBULATORY_CARE_PROVIDER_SITE_OTHER): Payer: Medicare Other | Admitting: Emergency Medicine

## 2013-09-15 VITALS — BP 100/60 | HR 62 | Temp 97.9°F | Resp 16 | Ht 60.0 in | Wt 120.0 lb

## 2013-09-15 DIAGNOSIS — S81009A Unspecified open wound, unspecified knee, initial encounter: Secondary | ICD-10-CM

## 2013-09-15 DIAGNOSIS — S81002A Unspecified open wound, left knee, initial encounter: Secondary | ICD-10-CM

## 2013-09-15 DIAGNOSIS — M25562 Pain in left knee: Secondary | ICD-10-CM

## 2013-09-15 DIAGNOSIS — M25569 Pain in unspecified knee: Secondary | ICD-10-CM

## 2013-09-15 MED ORDER — MUPIROCIN 2 % EX OINT: 1.0000 "application " | TOPICAL_OINTMENT | Freq: Two times a day (BID) | CUTANEOUS | Status: DC

## 2013-09-15 NOTE — Progress Notes (Addendum)
Subjective:    Patient ID: Gina Harrell, female    DOB: [birth year], 72 y.o.   MRN: [id number]  HPI This chart was scribed for Viviann Spare Doc Mandala-MD, by Ladona Ridgel Day, Scribe. This patient was seen in room 12 and the patient's care was started at 3:44 PM.  HPI Comments: Patient was in good health until earlier this week when she ran into any glass table causing a wound to her left shin    Past Medical History  Diagnosis Date  . Uterine prolapse   . Cancer     Bladder cancer  . Hypertension   . Elevated cholesterol   . Osteoporosis     Past Surgical History  Procedure Laterality Date  . Vaginal hysterectomy    . Bladder tumor excision      Family History  Problem Relation Age of Onset  . Cancer Mother     Colon cancer  . Hypertension Mother   . Heart disease Father   . Breast cancer Maternal Aunt     Age 87's  . Diabetes Maternal Grandmother     History   Social History  . Marital Status: Divorced    Spouse Name: N/A    Number of Children: N/A  . Years of Education: N/A   Occupational History  . Not on file.   Social History Main Topics  . Smoking status: Current Every Day Smoker -- 0.50 packs/day    Types: Cigarettes  . Smokeless tobacco: Not on file  . Alcohol Use: 3.5 oz/week    7 drink(s) per week  . Drug Use: Not on file  . Sexual Activity: No   Other Topics Concern  . Not on file   Social History Narrative  . No narrative on file    No Known Allergies  Patient Active Problem List   Diagnosis Date Noted  . HTN (hypertension) 06/05/2012  . Uterine prolapse   . Cancer   . Osteoporosis     Results for orders placed in visit on 06/05/12  HERPES SIMPLEX VIRUS CULTURE      Result Value Range   Organism ID, Bacteria No Herpes Simplex Virus detected.      No diagnosis found.  No orders of the defined types were placed in this encounter.     Review of Systems  Skin: Positive for wound (laceration left anterior shin).   Triage Vitals: BP  100/60  Pulse 62  Temp(Src) 97.9 F (36.6 C) (Oral)  Resp 16  Ht 5' (1.524 m)  Wt 120 lb (54.432 kg)  BMI 23.44 kg/m2  SpO2 96%     Objective:   Physical Exam Physical Exam  Nursing note and vitals reviewed. Constitutional: Patient is oriented to person, place, and time. Patient appears well-developed and well-nourished. No distress.  HENT:  Head: Normocephalic and atraumatic.  Neck: Neck supple. No tracheal deviation present.  Cardiovascular: Normal rate, regular rhythm and normal heart sounds.   No murmur heard. Pulmonary/Chest: Effort normal and breath sounds normal. No respiratory distress. Patient has no wheezes. Patient has no rales.  Musculoskeletal: Normal range of motion.  Neurological: Patient is alert and oriented to person, place, and time.  Skin: Skin is warm and dry.  Psychiatric: Patient has a normal mood and affect. Patient's behavior is normal.   There is a 1 x 1" area of skin loss down to the subcutaneous tissue over the mid anterior shin there is no surrounding redness or streaking . Just inferior to this is a half centimeter  by half centimeter open area of skin loss. [facility] reading (PRIMARY) by  Dr.Damonte Frieson x-ray shows no fracture of the tibia or fibula   Results for orders placed in visit on 06/05/12  HERPES SIMPLEX VIRUS CULTURE      Result Value Range   Organism ID, Bacteria No Herpes Simplex Virus detected.         Assessment & Plan:  Wound of the shin is taken she will treat the area was silver water cleaning followed by application of Bactroban ointment

## 2013-09-18 LAB — WOUND CULTURE
Gram Stain: NONE SEEN
Organism ID, Bacteria: NO GROWTH

## 2014-01-11 ENCOUNTER — Ambulatory Visit (INDEPENDENT_AMBULATORY_CARE_PROVIDER_SITE_OTHER): Payer: Medicare Other | Admitting: Cardiovascular Disease

## 2014-01-11 ENCOUNTER — Encounter: Payer: Self-pay | Admitting: Cardiovascular Disease

## 2014-01-11 VITALS — BP 130/90 | HR 67 | Resp 16 | Ht 61.0 in | Wt 117.8 lb

## 2014-01-11 DIAGNOSIS — E7800 Pure hypercholesterolemia, unspecified: Secondary | ICD-10-CM | POA: Insufficient documentation

## 2014-01-11 DIAGNOSIS — I10 Essential (primary) hypertension: Secondary | ICD-10-CM

## 2014-01-11 DIAGNOSIS — F17200 Nicotine dependence, unspecified, uncomplicated: Secondary | ICD-10-CM

## 2014-01-11 DIAGNOSIS — I350 Nonrheumatic aortic (valve) stenosis: Secondary | ICD-10-CM | POA: Insufficient documentation

## 2014-01-11 DIAGNOSIS — I359 Nonrheumatic aortic valve disorder, unspecified: Secondary | ICD-10-CM

## 2014-01-11 DIAGNOSIS — Z720 Tobacco use: Secondary | ICD-10-CM | POA: Insufficient documentation

## 2014-01-11 NOTE — Assessment & Plan Note (Signed)
Strongly encouraged to quit smoking completely

## 2014-01-11 NOTE — Assessment & Plan Note (Signed)
Her echocardiogram in March of 2013 showed mild aortic valve calcification with particular immobility of the non-coronary cusp and a peak transaortic valve gradient of 22 mm Hg, mean gradient 9 mm Hg, calculated aortic valve area 1.2 cm square, mild aortic insufficiency. Her symptoms have not changed since then and it is unlikely that her aortic valve stenosis has progressed to hemodynamically significant degree. Her aortic stenosis is unlikely the cause of the dyspnea. I suspect this is related to smoking related chronic lung disease.

## 2014-01-11 NOTE — Patient Instructions (Signed)
Your physician recommends that you schedule a follow-up appointment in: ONE YEAR 

## 2014-01-11 NOTE — Progress Notes (Signed)
Patient ID: Gina Harrell, female   DOB: 1941-04-19, 73 y.o.   MRN: [id number]      Reason for office visit Aortic stenosis  Gina Harrell returns for routine followup. She has mild aortic stenosis and mild aortic insufficiency, systemic hypertension and hyperlipidemia. She continues to smoke but has cut down to less than half a pack of cigarettes a day.  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 She continues to describe a similar degree of shortness of breath on exertion (NYHA class II. She becomes a little winded when she climbs the 14 steps in her house and had to stop halfway through mopping her kitchen.  She had an unusual event in May of 2014 when she had a splitting headache followed by blurry vision in her right eye. An MRI of her brain did not show any acute findings but showed atrophic brain changes, particularly circumscribed to the vermis and general to the posterior fossa. Her neurological complaints have resolved completely and have not recurred.   No Known Allergies  Current Outpatient Prescriptions  Medication Sig Dispense Refill  . ALPRAZolam (XANAX) 0.5 MG tablet Take 0.5 mg by mouth at bedtime as needed.        Marland Kitchen aspirin 81 MG tablet Take 81 mg by mouth daily.        Marland Kitchen levothyroxine (SYNTHROID, LEVOTHROID) 25 MCG tablet Take 25 mcg by mouth daily.      Marland Kitchen lovastatin (MEVACOR) 20 MG tablet Take 40 mg by mouth daily.       . pantoprazole (PROTONIX) 40 MG tablet Take 40 mg by mouth daily as needed.       . triamterene-hydrochlorothiazide (MAXZIDE-25) 37.5-25 MG per tablet Take 1 tablet by mouth daily.       No current facility-administered medications for this visit.    Past Medical History  Diagnosis Date  . Uterine prolapse   . Cancer     Bladder cancer  . Hypertension   . Elevated cholesterol   . Osteoporosis     Past Surgical History  Procedure Laterality Date  . Vaginal hysterectomy    . Bladder tumor excision      Family  History  Problem Relation Age of Onset  . Cancer Mother     Colon cancer  . Hypertension Mother   . Heart disease Father   . Breast cancer Maternal Aunt     Age 18's  . Diabetes Maternal Grandmother     History   Social History  . Marital Status: Divorced    Spouse Name: N/A    Number of Children: N/A  . Years of Education: N/A   Occupational History  . Not on file.   Social History Main Topics  . Smoking status: Current Every Day Smoker -- 0.50 packs/day    Types: Cigarettes  . Smokeless tobacco: Not on file  . Alcohol Use: 3.5 oz/week    7 drink(s) per week  . Drug Use: Not on file  . Sexual Activity: No   Other Topics Concern  . Not on file   Social History Narrative  . No narrative on file    Review of systems: The patient specifically denies any chest pain at rest or with exertion, dyspnea at rest, orthopnea, paroxysmal nocturnal dyspnea, syncope, palpitations, focal neurological deficits, intermittent claudication, lower extremity edema, unexplained weight gain, cough, hemoptysis or wheezing.  The patient also denies abdominal pain, nausea, vomiting, dysphagia, diarrhea, constipation, polyuria, polydipsia, dysuria, hematuria, frequency, urgency, abnormal  bleeding or bruising, fever, chills, unexpected weight changes, mood swings, change in skin or hair texture, change in voice quality, auditory or visual problems, allergic reactions or rashes, new musculoskeletal complaints other than usual "aches and pains".   PHYSICAL EXAM BP 130/90  Pulse 67  Ht 5\' 1"  (1.549 m)  Wt 53.434 kg (117 lb 12.8 oz)  BMI 22.27 kg/m2  General: Alert, oriented x3, no distress Head: no evidence of trauma, PERRL, EOMI, no exophtalmos or lid lag, no myxedema, no xanthelasma; normal ears, nose and oropharynx Neck: normal jugular venous pulsations and no hepatojugular reflux; brisk carotid pulses without delay and soft bilateral carotid bruits Chest: clear to auscultation, no signs of  consolidation by percussion or palpation, normal fremitus, symmetrical and full respiratory excursions Cardiovascular: normal position and quality of the apical impulse, regular rhythm, normal first and second heart sounds, no  rubs or gallops, early peaking 2/6 systolic ejection murmur in the aortic focus radiating to the carotids, no diastolic murmur Abdomen: no tenderness or distention, no masses by palpation, no abnormal pulsatility or arterial bruits, normal bowel sounds, no hepatosplenomegaly Extremities: no clubbing, cyanosis or edema; 2+ radial, ulnar and brachial pulses bilaterally; 2+ right femoral, posterior tibial and dorsalis pedis pulses; 2+ left femoral, posterior tibial and dorsalis pedis pulses; no subclavian or femoral bruits Neurological: grossly nonfocal   EKG: NSR, question of left atrial abnormality, nonspecific T-wave flattening in the inferolateral   Lipid Panel  Total cholesterol 213, crit was is 100, HDL 57, LDL 136(one year ago)  BMET    Component Value Date/Time   NA 138 12/28/2008 0807   K 3.9 12/28/2008 0807   GLUCOSE 88 12/28/2008 0807   CREATININE 1.18* 03/09/2012 1012   CALCIUM 9.9 03/09/2012 1012     ASSESSMENT AND PLAN No problem-specific assessment & plan notes found for this encounter.  Orders Placed This Encounter  Procedures  . EKG 12-Lead   Meds ordered this encounter  Medications  . levothyroxine (SYNTHROID, LEVOTHROID) 25 MCG tablet    Sig: Take 25 mcg by mouth daily.    Junious Silk, MD, [facility] [phone] office [phone] pager

## 2014-01-11 NOTE — Assessment & Plan Note (Signed)
We'll get her most recent labs before deciding whether or not her medication needs adjustment. Note that she has a pretty good HDL cholesterol. He does not have a known vascular disease. Her carotid bruit seem to be primarily related to radiation from the chest, with mild fibrous plaque only seen on ultrasonography

## 2014-01-11 NOTE — Assessment & Plan Note (Signed)
Good control

## 2014-01-16 ENCOUNTER — Other Ambulatory Visit: Payer: Self-pay

## 2014-01-16 DIAGNOSIS — Z1231 Encounter for screening mammogram for malignant neoplasm of breast: Secondary | ICD-10-CM

## 2014-02-16 ENCOUNTER — Ambulatory Visit: Payer: Medicare Other

## 2014-02-23 ENCOUNTER — Ambulatory Visit: Payer: Medicare Other

## 2014-03-09 ENCOUNTER — Ambulatory Visit
Admission: RE | Admit: 2014-03-09 | Discharge: 2014-03-09 | Disposition: A | Discharge status: Home / Self Care | Payer: Medicare Other | Source: Ambulatory Visit

## 2014-03-09 ENCOUNTER — Encounter (INDEPENDENT_AMBULATORY_CARE_PROVIDER_SITE_OTHER): Payer: Self-pay

## 2014-03-09 DIAGNOSIS — Z1231 Encounter for screening mammogram for malignant neoplasm of breast: Secondary | ICD-10-CM

## 2014-03-18 ENCOUNTER — Ambulatory Visit: Payer: Medicare Other

## 2014-03-18 ENCOUNTER — Ambulatory Visit (INDEPENDENT_AMBULATORY_CARE_PROVIDER_SITE_OTHER): Payer: Medicare Other | Admitting: Family Medicine

## 2014-03-18 VITALS — BP 124/72 | HR 61 | Temp 97.6°F | Resp 20 | Ht 59.5 in | Wt 119.4 lb

## 2014-03-18 DIAGNOSIS — M25572 Pain in left ankle and joints of left foot: Secondary | ICD-10-CM

## 2014-03-18 DIAGNOSIS — S81802A Unspecified open wound, left lower leg, initial encounter: Secondary | ICD-10-CM

## 2014-03-18 DIAGNOSIS — S8012XA Contusion of left lower leg, initial encounter: Secondary | ICD-10-CM

## 2014-03-18 DIAGNOSIS — M25579 Pain in unspecified ankle and joints of unspecified foot: Secondary | ICD-10-CM

## 2014-03-18 DIAGNOSIS — S81009A Unspecified open wound, unspecified knee, initial encounter: Secondary | ICD-10-CM

## 2014-03-18 DIAGNOSIS — IMO0002 Reserved for concepts with insufficient information to code with codable children: Secondary | ICD-10-CM

## 2014-03-18 DIAGNOSIS — S91009A Unspecified open wound, unspecified ankle, initial encounter: Secondary | ICD-10-CM

## 2014-03-18 DIAGNOSIS — S8010XA Contusion of unspecified lower leg, initial encounter: Secondary | ICD-10-CM

## 2014-03-18 DIAGNOSIS — T148XXA Other injury of unspecified body region, initial encounter: Secondary | ICD-10-CM

## 2014-03-18 DIAGNOSIS — S81809A Unspecified open wound, unspecified lower leg, initial encounter: Secondary | ICD-10-CM

## 2014-03-18 NOTE — Progress Notes (Addendum)
Subjective:  This chart was scribed for Meredith Staggers, MD by Quintella Reichert, Scribe.  This patient was seen in [facility] Room 4 and the patient's care was started at 2:25 PM.   Patient ID: Gina Harrell, female    DOB: March 04, 1941, 73 y.o.   MRN: [id number]  Chief Complaint  Patient presents with  . left ankle injury    dropped a concrete block fell on her left  lower leg last night at work.  left ankle is swollen.      HPI  Gina Harrell is a 73 y.o. female PCP: ARONSON,RICHARD A, MD   Pt presents with a a left lower leg and ankle injury sustained last night.  She reports that she was at her work at a [employer] and was reaching for a box when a large concrete cinder block fell and slid down her left lower leg.  She sustained an abrasion and skin tear to the front of her leg and developed immediate onset of constant moderate soreness to that area.  She washed the area with water and soap and continued working for the rest of the day.  After work she went to [facility] and was told she would likely be okay to wait until today before being seen.  Last night she noticed pain and swelling to the left ankle.  She thinks the block may have slid into her ankle but it did not fall directly onto it.  She has been able to ambulate and bear weight, with some pain.  Patient also placed petroleum jelly on her wound last night.  She denies prior h/o ankle fracture.  She takes aspirin.  On Reclast for osteoporosis.   Patient Active Problem List   Diagnosis Date Noted  . Aortic stenosis 01/11/2014  . Hypercholesteremia 01/11/2014  . Tobacco abuse 01/11/2014  . HTN (hypertension) 06/05/2012  . Uterine prolapse   . Cancer   . Osteoporosis     Past Medical History  Diagnosis Date  . Uterine prolapse   . Cancer     Bladder cancer  . Hypertension   . Elevated cholesterol   . Osteoporosis     Past Surgical History  Procedure Laterality Date  . Vaginal hysterectomy    . Bladder tumor  excision      No Known Allergies  Prior to Admission medications   Medication Sig Start Date End Date Taking? Authorizing Provider  ALPRAZolam Prudy Feeler) 0.5 MG tablet Take 0.5 mg by mouth at bedtime as needed.     Yes Historical Provider, MD  aspirin 81 MG tablet Take 81 mg by mouth daily.     Yes Historical Provider, MD  levothyroxine (SYNTHROID, LEVOTHROID) 50 MCG tablet Take 50 mcg by mouth daily before breakfast.   Yes Historical Provider, MD  lovastatin (MEVACOR) 20 MG tablet Take 40 mg by mouth daily.    Yes Historical Provider, MD  triamterene-hydrochlorothiazide (MAXZIDE-25) 37.5-25 MG per tablet Take 1 tablet by mouth daily.   Yes Historical Provider, MD    History   Social History  . Marital Status: Divorced    Spouse Name: N/A    Number of Children: N/A  . Years of Education: N/A   Occupational History  . Not on file.   Social History Main Topics  . Smoking status: Current Every Day Smoker -- 0.50 packs/day    Types: Cigarettes  . Smokeless tobacco: Not on file  . Alcohol Use: 3.5 oz/week    7 drink(s) per  week  . Drug Use: Not on file  . Sexual Activity: No   Other Topics Concern  . Not on file   Social History Narrative  . No narrative on file     Review of Systems  Musculoskeletal: Positive for arthralgias (left ankle) and joint swelling (left ankle).  Skin: Positive for wound.         Objective:   Physical Exam  Nursing note and vitals reviewed. Constitutional: She is oriented to person, place, and time. She appears well-developed and well-nourished. No distress.  HENT:  Head: Normocephalic and atraumatic.  Eyes: EOM are normal.  Neck: Neck supple. No tracheal deviation present.  Cardiovascular: Normal rate.   Pulmonary/Chest: Effort normal. No respiratory distress.  Musculoskeletal:  Tender across the distal anterior left tibia.  Three abrasions/two small skin tears in the anterior aspect of left lower leg. Soft tissue swelling to medial  left ankle, with faint ecchymosis. Left foot and ankle: no bony tenderness, full ROM. Full strength including resisted dorsiflexion, without pain  Neurological: She is alert and oriented to person, place, and time.  Skin: Skin is warm and dry.  Psychiatric: She has a normal mood and affect. Her behavior is normal.     Filed Vitals:   03/18/14 1305  BP: 124/72  Pulse: 61  Temp: 97.6 F (36.4 C)  TempSrc: Oral  Resp: 20  Height: 4' 11.5" (1.511 m)  Weight: 119 lb 6.4 oz (54.159 kg)  SpO2: 98%    [facility] reading (PRIMARY) by  Dr. Neva Seat: L ankle and tib-fib: no fracture.     Assessment & Plan:   Gina Harrell is a 73 y.o. female Skin tear  Ankle pain, left - Plan: DG Ankle Complete Left, DG Tibia/Fibula Left  Wound of left leg - Plan: DG Ankle Complete Left, DG Tibia/Fibula Left  Contusion of leg, left - Plan: DG Ankle Complete Left, DG Tibia/Fibula Left  Contusion with sts and few skin tears. No fracture seen. Sx care as below, wound care and rtc precautions discussed. Tylenol if needed.   Meds ordered this encounter  Medications  . levothyroxine (SYNTHROID, LEVOTHROID) 50 MCG tablet    Sig: Take 50 mcg by mouth daily before breakfast.   Patient Instructions  I do not see any breaks in your bones. Keep leg elevated to help with swelling, ice if needed, and other info below. Soap and water, vaseline and bandage to wounds. If any increased redness, discharge, or worsening - return for recheck.   Return to the clinic or go to the nearest emergency room if any of your symptoms worsen or new symptoms occur.   Skin Tear Care A skin tear is a wound in which the top layer of skin has peeled off. This is a common problem with aging because the skin becomes thinner and more fragile as a person gets older. In addition, some medicines, such as oral corticosteroids, can lead to skin thinning if taken for long periods of time.  A skin tear is often repaired with tape or skin  adhesive strips. This keeps the skin that has been peeled off in contact with the healthier skin beneath. Depending on the location of the wound, a bandage (dressing) may be applied over the tape or skin adhesive strips. Sometimes, during the healing process, the skin turns black and dies. Even when this happens, the torn skin acts as a good dressing until the skin underneath gets healthier and repairs itself. HOME CARE INSTRUCTIONS   Change  dressings once per day or as directed by your caregiver.  Gently clean the skin tear and the area around the tear using saline solution or mild soap and water.  Do not rub the injured skin dry. Let the area air dry.  Apply petroleum jelly or an antibiotic cream or ointment to keep the tear moist. This will help the wound heal. Do not allow a scab to form.  If the dressing sticks before the next dressing change, moisten it with warm soapy water and gently remove it.  Protect the injured skin until it has healed.  Only take over-the-counter or prescription medicines as directed by your caregiver.  Take showers or baths using warm soapy water. Apply a new dressing after the shower or bath.  Keep all follow-up appointments as directed by your caregiver.  SEEK IMMEDIATE MEDICAL CARE IF:   You have redness, swelling, or increasing pain in the skin tear.  You havepus coming from the skin tear.  You have chills.  You have a red streak that goes away from the skin tear.  You have a bad smell coming from the tear or dressing.  You have a fever or persistent symptoms for more than 2 3 days.  You have a fever and your symptoms suddenly get worse. MAKE SURE YOU:  Understand these instructions.  Will watch this condition.  Will get help right away if your child is not doing well or gets worse. Document Released: 07/21/2001 Document Revised: 07/20/2012 Document Reviewed: 05/09/2012 [facility] Patient Information 2014 [place], [place]. Contusion A  contusion is a deep bruise. Contusions are the result of an injury that caused bleeding under the skin. The contusion may turn blue, purple, or yellow. Minor injuries will give you a painless contusion, but more severe contusions may stay painful and swollen for a few weeks.  CAUSES  A contusion is usually caused by a blow, trauma, or direct force to an area of the body. SYMPTOMS   Swelling and redness of the injured area.  Bruising of the injured area.  Tenderness and soreness of the injured area.  Pain. DIAGNOSIS  The diagnosis can be made by taking a history and physical exam. An X-ray, CT scan, or MRI may be needed to determine if there were any associated injuries, such as fractures. TREATMENT  Specific treatment will depend on what area of the body was injured. In general, the best treatment for a contusion is resting, icing, elevating, and applying cold compresses to the injured area. Over-the-counter medicines may also be recommended for pain control. Ask your caregiver what the best treatment is for your contusion. HOME CARE INSTRUCTIONS   Put ice on the injured area.  Put ice in a plastic bag.  Place a towel between your skin and the bag.  Leave the ice on for 15-20 minutes, 03-04 times a day.  Only take over-the-counter or prescription medicines for pain, discomfort, or fever as directed by your caregiver. Your caregiver may recommend avoiding anti-inflammatory medicines (aspirin, ibuprofen, and naproxen) for 48 hours because these medicines may increase bruising.  Rest the injured area.  If possible, elevate the injured area to reduce swelling. SEEK IMMEDIATE MEDICAL CARE IF:   You have increased bruising or swelling.  You have pain that is getting worse.  Your swelling or pain is not relieved with medicines. MAKE SURE YOU:   Understand these instructions.  Will watch your condition.  Will get help right away if you are not doing well or get worse.  Document  Released: 08/05/2005 Document Revised: 01/18/2012 Document Reviewed: 08/31/2011 [facility] Patient Information 2014 [place], [place].    RICE: Routine Care for Injuries The routine care of many injuries includes Rest, Ice, Compression, and Elevation (RICE). HOME CARE INSTRUCTIONS  Rest is needed to allow your body to heal. Routine activities can usually be resumed when comfortable. Injured tendons and bones can take up to 6 weeks to heal. Tendons are the cord-like structures that attach muscle to bone.  Ice following an injury helps keep the swelling down and reduces pain.  Put ice in a plastic bag.  Place a towel between your skin and the bag.  Leave the ice on for 15-20 minutes, 03-04 times a day. Do this while awake, for the first 24 to 48 hours. After that, continue as directed by your caregiver.  Compression helps keep swelling down. It also gives support and helps with discomfort. If an elastic bandage has been applied, it should be removed and reapplied every 3 to 4 hours. It should not be applied tightly, but firmly enough to keep swelling down. Watch fingers or toes for swelling, bluish discoloration, coldness, numbness, or excessive pain. If any of these problems occur, remove the bandage and reapply loosely. Contact your caregiver if these problems continue.  Elevation helps reduce swelling and decreases pain. With extremities, such as the arms, hands, legs, and feet, the injured area should be placed near or above the level of the heart, if possible. SEEK IMMEDIATE MEDICAL CARE IF:  You have persistent pain and swelling.  You develop redness, numbness, or unexpected weakness.  Your symptoms are getting worse rather than improving after several days. These symptoms may indicate that further evaluation or further X-rays are needed. Sometimes, X-rays may not show a small broken bone (fracture) until 1 week or 10 days later. Make a follow-up appointment with your caregiver. Ask when  your X-ray results will be ready. Make sure you get your X-ray results. Document Released: 02/07/2001 Document Revised: 01/18/2012 Document Reviewed: 03/27/2011 [facility] Patient Information 2014 [place], [place].       I personally performed the services described in this documentation, which was scribed in my presence. The recorded information has been reviewed and considered, and addended by me as needed.

## 2014-03-18 NOTE — Patient Instructions (Signed)
I do not see any breaks in your bones. Keep leg elevated to help with swelling, ice if needed, and other info below. Soap and water, vaseline and bandage to wounds. If any increased redness, discharge, or worsening - return for recheck.   Return to the clinic or go to the nearest emergency room if any of your symptoms worsen or new symptoms occur.   Skin Tear Care A skin tear is a wound in which the top layer of skin has peeled off. This is a common problem with aging because the skin becomes thinner and more fragile as a person gets older. In addition, some medicines, such as oral corticosteroids, can lead to skin thinning if taken for long periods of time.  A skin tear is often repaired with tape or skin adhesive strips. This keeps the skin that has been peeled off in contact with the healthier skin beneath. Depending on the location of the wound, a bandage (dressing) may be applied over the tape or skin adhesive strips. Sometimes, during the healing process, the skin turns black and dies. Even when this happens, the torn skin acts as a good dressing until the skin underneath gets healthier and repairs itself. HOME CARE INSTRUCTIONS   Change dressings once per day or as directed by your caregiver.  Gently clean the skin tear and the area around the tear using saline solution or mild soap and water.  Do not rub the injured skin dry. Let the area air dry.  Apply petroleum jelly or an antibiotic cream or ointment to keep the tear moist. This will help the wound heal. Do not allow a scab to form.  If the dressing sticks before the next dressing change, moisten it with warm soapy water and gently remove it.  Protect the injured skin until it has healed.  Only take over-the-counter or prescription medicines as directed by your caregiver.  Take showers or baths using warm soapy water. Apply a new dressing after the shower or bath.  Keep all follow-up appointments as directed by your caregiver.   SEEK IMMEDIATE MEDICAL CARE IF:   You have redness, swelling, or increasing pain in the skin tear.  You havepus coming from the skin tear.  You have chills.  You have a red streak that goes away from the skin tear.  You have a bad smell coming from the tear or dressing.  You have a fever or persistent symptoms for more than 2 3 days.  You have a fever and your symptoms suddenly get worse. MAKE SURE YOU:  Understand these instructions.  Will watch this condition.  Will get help right away if your child is not doing well or gets worse. Document Released: 07/21/2001 Document Revised: 07/20/2012 Document Reviewed: 05/09/2012 [facility] Patient Information 2014 [place]. Contusion A contusion is a deep bruise. Contusions are the result of an injury that caused bleeding under the skin. The contusion may turn blue, purple, or yellow. Minor injuries will give you a painless contusion, but more severe contusions may stay painful and swollen for a few weeks.  CAUSES  A contusion is usually caused by a blow, trauma, or direct force to an area of the body. SYMPTOMS   Swelling and redness of the injured area.  Bruising of the injured area.  Tenderness and soreness of the injured area.  Pain. DIAGNOSIS  The diagnosis can be made by taking a history and physical exam. An X-ray, CT scan, or MRI may be needed to determine if  there were any associated injuries, such as fractures. TREATMENT  Specific treatment will depend on what area of the body was injured. In general, the best treatment for a contusion is resting, icing, elevating, and applying cold compresses to the injured area. Over-the-counter medicines may also be recommended for pain control. Ask your caregiver what the best treatment is for your contusion. HOME CARE INSTRUCTIONS   Put ice on the injured area.  Put ice in a plastic bag.  Place a towel between your skin and the bag.  Leave the ice on for 15-20 minutes,  03-04 times a day.  Only take over-the-counter or prescription medicines for pain, discomfort, or fever as directed by your caregiver. Your caregiver may recommend avoiding anti-inflammatory medicines (aspirin, ibuprofen, and naproxen) for 48 hours because these medicines may increase bruising.  Rest the injured area.  If possible, elevate the injured area to reduce swelling. SEEK IMMEDIATE MEDICAL CARE IF:   You have increased bruising or swelling.  You have pain that is getting worse.  Your swelling or pain is not relieved with medicines. MAKE SURE YOU:   Understand these instructions.  Will watch your condition.  Will get help right away if you are not doing well or get worse. Document Released: 08/05/2005 Document Revised: 01/18/2012 Document Reviewed: 08/31/2011 [facility] Patient Information 2014 [place], [place].    RICE: Routine Care for Injuries The routine care of many injuries includes Rest, Ice, Compression, and Elevation (RICE). HOME CARE INSTRUCTIONS  Rest is needed to allow your body to heal. Routine activities can usually be resumed when comfortable. Injured tendons and bones can take up to 6 weeks to heal. Tendons are the cord-like structures that attach muscle to bone.  Ice following an injury helps keep the swelling down and reduces pain.  Put ice in a plastic bag.  Place a towel between your skin and the bag.  Leave the ice on for 15-20 minutes, 03-04 times a day. Do this while awake, for the first 24 to 48 hours. After that, continue as directed by your caregiver.  Compression helps keep swelling down. It also gives support and helps with discomfort. If an elastic bandage has been applied, it should be removed and reapplied every 3 to 4 hours. It should not be applied tightly, but firmly enough to keep swelling down. Watch fingers or toes for swelling, bluish discoloration, coldness, numbness, or excessive pain. If any of these problems occur, remove the  bandage and reapply loosely. Contact your caregiver if these problems continue.  Elevation helps reduce swelling and decreases pain. With extremities, such as the arms, hands, legs, and feet, the injured area should be placed near or above the level of the heart, if possible. SEEK IMMEDIATE MEDICAL CARE IF:  You have persistent pain and swelling.  You develop redness, numbness, or unexpected weakness.  Your symptoms are getting worse rather than improving after several days. These symptoms may indicate that further evaluation or further X-rays are needed. Sometimes, X-rays may not show a small broken bone (fracture) until 1 week or 10 days later. Make a follow-up appointment with your caregiver. Ask when your X-ray results will be ready. Make sure you get your X-ray results. Document Released: 02/07/2001 Document Revised: 01/18/2012 Document Reviewed: 03/27/2011 [facility] Patient Information 2014 [place], [place].

## 2015-01-15 ENCOUNTER — Encounter: Payer: Self-pay | Admitting: *Deleted

## 2015-02-04 ENCOUNTER — Ambulatory Visit (INDEPENDENT_AMBULATORY_CARE_PROVIDER_SITE_OTHER): Payer: Medicare Other | Admitting: Cardiovascular Disease

## 2015-02-04 ENCOUNTER — Encounter: Payer: Self-pay | Admitting: Cardiovascular Disease

## 2015-02-04 VITALS — BP 106/62 | HR 80 | Resp 16 | Ht 59.5 in | Wt 104.8 lb

## 2015-02-04 DIAGNOSIS — I350 Nonrheumatic aortic (valve) stenosis: Secondary | ICD-10-CM | POA: Diagnosis not present

## 2015-02-04 DIAGNOSIS — E7800 Pure hypercholesterolemia, unspecified: Secondary | ICD-10-CM

## 2015-02-04 DIAGNOSIS — Z720 Tobacco use: Secondary | ICD-10-CM

## 2015-02-04 DIAGNOSIS — I10 Essential (primary) hypertension: Secondary | ICD-10-CM | POA: Diagnosis not present

## 2015-02-04 NOTE — Patient Instructions (Signed)
DECREASE Triamterene/HCTZ to one tablet every other day.  Your physician discussed the hazards of tobacco use. Tobacco use cessation is recommended and techniques and options to help you quit were discussed.   Dr. Sallyanne Kuster recommends that you schedule a follow-up appointment in: One year.

## 2015-02-04 NOTE — Progress Notes (Signed)
Patient ID: FIKISHA MANZOOR, female   DOB: [birth year], 74 y.o.   MRN: [id number]     Cardiology Office Note   Date:  02/04/2015   ID:  ROMONA GRZYWACZ, DOB May 23, 1941, MRN [id number]  PCP:  Minda Meo, MD  Cardiologist:   Thurmon Fair, MD   Chief Complaint  Patient presents with  . Annual Exam    Recently diagnosed with rotating scoliosis.  No chest pain, SOB, edema.  Occas. lightheadedness with bend over.      History of Present Illness: VELINDA ROSENKRANTZ is a 74 y.o. female who presents for follow-up for hyperlipidemia and hypertension and ongoing tobacco use. She has a loud aortic valve murmur but had only mild aortic valve stenosis on echocardiography. She has a left carotid bruit, but had only mild plaque without significant obstruction on ultrasound.  She had a lot of problems with her teeth and lost substantial weight and is now borderline underweight with a BMI just under 21. She feels generally well but occasionally becomes dizzy if she stands up quickly or makes quick moves. Her blood pressure is in the low normal range. She recently had labs with Dr. Jacky Kindle in her lipid profile was fair. Her LDL cholesterol is 115. While this is not in the ideal range it is a substantial decrease from her baseline values.  She has no complaints of chest discomfort, shortness of breath or dizziness on exertion.  Past Medical History  Diagnosis Date  . Uterine prolapse   . Cancer     Bladder cancer  . Hypertension   . Elevated cholesterol   . Osteoporosis     Past Surgical History  Procedure Laterality Date  . Vaginal hysterectomy    . Bladder tumor excision    . Carotid doppler  2013  . Doppler echocardiography  2013     Current Outpatient Prescriptions  Medication Sig Dispense Refill  . ALPRAZolam (XANAX) 0.5 MG tablet Take 0.5 mg by mouth at bedtime as needed.      Marland Kitchen aspirin 81 MG tablet Take 81 mg by mouth daily.      . Cholecalciferol (VITAMIN D-3) 1000 UNITS  CAPS Take 1 capsule by mouth daily.    . diphenhydramine-acetaminophen (TYLENOL PM) 25-500 MG TABS Take 1 tablet by mouth at bedtime as needed.    Marland Kitchen levothyroxine (SYNTHROID, LEVOTHROID) 50 MCG tablet Take 50 mcg by mouth daily before breakfast.    . lovastatin (MEVACOR) 20 MG tablet Take 40 mg by mouth daily.     Marland Kitchen triamterene-hydrochlorothiazide (MAXZIDE-25) 37.5-25 MG per tablet Take 1 tablet by mouth every other day.     No current facility-administered medications for this visit.    Allergies:   Review of patient's allergies indicates no known allergies.    Social History:  The patient  reports that she has been smoking Cigarettes.  She has been smoking about 0.50 packs per day. She does not have any smokeless tobacco history on file. She reports that she drinks about 3.5 oz of alcohol per week.   Family History:  The patient's family history includes Breast cancer in her maternal aunt; Cancer in her mother; Diabetes in her maternal grandmother; Heart disease in her father; Hypertension in her mother.    ROS:  Please see the history of present illness.    Otherwise, review of systems positive for none.   All other systems are reviewed and negative.    PHYSICAL EXAM: VS:  BP 106/62 mmHg  Pulse 80  Resp 16  Ht 4' 11.5" (1.511 m)  Wt 104 lb 12.8 oz (47.537 kg)  BMI 20.82 kg/m2 , BMI Body mass index is 20.82 kg/(m^2).  General: Alert, oriented x3, no distress Head: no evidence of trauma, PERRL, EOMI, no exophtalmos or lid lag, no myxedema, no xanthelasma; normal ears, nose and oropharynx Neck: normal jugular venous pulsations and no hepatojugular reflux; brisk carotid pulses without delay and no carotid bruits Chest: clear to auscultation, no signs of consolidation by percussion or palpation, normal fremitus, symmetrical and full respiratory excursions Cardiovascular: normal position and quality of the apical impulse, regular rhythm, normal first and second heart sounds, no  murmurs, rubs or gallops Abdomen: no tenderness or distention, no masses by palpation, no abnormal pulsatility or arterial bruits, normal bowel sounds, no hepatosplenomegaly Extremities: no clubbing, cyanosis or edema; 2+ radial, ulnar and brachial pulses bilaterally; 2+ right femoral, posterior tibial and dorsalis pedis pulses; 2+ left femoral, posterior tibial and dorsalis pedis pulses; no subclavian or femoral bruits Neurological: grossly nonfocal Psych: euthymic mood, full affect   EKG:  EKG is ordered today. The ekg ordered today demonstrates normal sinus rhythm, questionable left atrial enlargement, subtle ST segment depression and T-wave inversion seen exclusively in lead 3, otherwise normal and not changed from previous tracings   Recent Labs: Dr. Jacky Kindle, March 7 Glucose 79, creatinine 1.1, normal liver function tests, hemoglobin 15.3, total cholesterol 188, triglycerides 103, HDL 52, LDL 115, normal TSH and a negative urinalysis   Wt Readings from Last 3 Encounters:  02/04/15 104 lb 12.8 oz (47.537 kg)  03/18/14 119 lb 6.4 oz (54.159 kg)  01/11/14 117 lb 12.8 oz (53.434 kg)     ASSESSMENT AND PLAN:  Jackie's blood pressure and cholesterol are substantially lower than at her previous appointment, probably since she has lost 15 pounds of weight. She is occasionally dizzy and I recommended she reduce her diuretic to an every other day schedule.  She is receiving lipid-lowering therapy for primary prevention and I think the current levels are acceptable.  I think the biggest remaining risk factors ongoing tobacco use. She still smokes about 10 cigarettes a day. She is not prepared to quit yet. She realizes that this is a behavior type of addiction. I don't think she is addicted to nicotine and I don't think nicotine supplements would help. I suggested she try using a water vapor device.  Her murmur of aortic stenosis is unchanged from last year. She does not have any symptoms echo  be attributable to aortic stenosis. It is likely to be many years until she develops hemodynamically significant aortic stenosis.  Current medicines are reviewed at length with the patient today.  The patient does not have concerns regarding medicines.  The following changes have been made:  Take triamterene-hydrochlorothiazide every other day  Labs/ tests ordered today include:  Orders Placed This Encounter  Procedures  . EKG 12-Lead   Patient Instructions  DECREASE Triamterene/HCTZ to one tablet every other day.  Your physician discussed the hazards of tobacco use. Tobacco use cessation is recommended and techniques and options to help you quit were discussed.   Dr. Royann Shivers recommends that you schedule a follow-up appointment in: One year.      Joie Bimler, MD  02/04/2015 4:55 PM    Thurmon Fair, MD, [facility] [phone] office [phone] pager

## 2015-02-05 ENCOUNTER — Encounter: Payer: Self-pay | Admitting: Cardiovascular Disease

## 2015-05-02 ENCOUNTER — Other Ambulatory Visit: Payer: Self-pay

## 2015-05-02 DIAGNOSIS — Z1231 Encounter for screening mammogram for malignant neoplasm of breast: Secondary | ICD-10-CM

## 2015-06-04 ENCOUNTER — Ambulatory Visit
Admission: RE | Admit: 2015-06-04 | Discharge: 2015-06-04 | Disposition: A | Discharge status: Home / Self Care | Payer: Medicare Other | Source: Ambulatory Visit

## 2015-06-04 DIAGNOSIS — Z1231 Encounter for screening mammogram for malignant neoplasm of breast: Secondary | ICD-10-CM

## 2015-12-26 ENCOUNTER — Ambulatory Visit (INDEPENDENT_AMBULATORY_CARE_PROVIDER_SITE_OTHER): Payer: Medicare Other | Admitting: Family Medicine

## 2015-12-26 VITALS — BP 116/75 | HR 77 | Temp 98.8°F | Resp 18 | Ht 60.0 in | Wt 108.4 lb

## 2015-12-26 DIAGNOSIS — J09X2 Influenza due to identified novel influenza A virus with other respiratory manifestations: Secondary | ICD-10-CM

## 2015-12-26 DIAGNOSIS — R6889 Other general symptoms and signs: Secondary | ICD-10-CM

## 2015-12-26 DIAGNOSIS — R05 Cough: Secondary | ICD-10-CM | POA: Diagnosis not present

## 2015-12-26 DIAGNOSIS — R059 Cough, unspecified: Secondary | ICD-10-CM

## 2015-12-26 DIAGNOSIS — R509 Fever, unspecified: Secondary | ICD-10-CM

## 2015-12-26 LAB — POCT INFLUENZA A/B
Influenza A, POC: POSITIVE — AB
Influenza B, POC: NEGATIVE

## 2015-12-26 MED ORDER — HYDROCODONE-HOMATROPINE 5-1.5 MG/5ML PO SYRP: 5.0000 mL | ORAL_SOLUTION | ORAL | Status: DC | PRN

## 2015-12-26 MED ORDER — OSELTAMIVIR PHOSPHATE 75 MG PO CAPS: 75.0000 mg | ORAL_CAPSULE | Freq: Two times a day (BID) | ORAL | Status: DC

## 2015-12-26 NOTE — Patient Instructions (Addendum)
Influenza, Adult Influenza ("the flu") is a viral infection of the respiratory tract. It occurs more often in winter months because people spend more time in close contact with one another. Influenza can make you feel very sick. Influenza easily spreads from person to person (contagious). CAUSES  Influenza is caused by a virus that infects the respiratory tract. You can catch the virus by breathing in droplets from an infected person's cough or sneeze. You can also catch the virus by touching something that was recently contaminated with the virus and then touching your mouth, nose, or eyes. RISKS AND COMPLICATIONS You may be at risk for a more severe case of influenza if you smoke cigarettes, have diabetes, have chronic heart disease (such as heart failure) or lung disease (such as asthma), or if you have a weakened immune system. Elderly people and pregnant women are also at risk for more serious infections. The most common problem of influenza is a lung infection (pneumonia). Sometimes, this problem can require emergency medical care and may be life threatening. SIGNS AND SYMPTOMS  Symptoms typically last 4 to 10 days and may include:  Fever.  Chills.  Headache, body aches, and muscle aches.  Sore throat.  Chest discomfort and cough.  Poor appetite.  Weakness or feeling tired.  Dizziness.  Nausea or vomiting. DIAGNOSIS  Diagnosis of influenza is often made based on your history and a physical exam. A nose or throat swab test can be done to confirm the diagnosis. TREATMENT  In mild cases, influenza goes away on its own. Treatment is directed at relieving symptoms. For more severe cases, your health care provider may prescribe antiviral medicines to shorten the sickness. Antibiotic medicines are not effective because the infection is caused by a virus, not by bacteria. HOME CARE INSTRUCTIONS  Take medicines only as directed by your health care provider.  Use a cool mist humidifier  to make breathing easier.  Get plenty of rest until your temperature returns to normal. This usually takes 3 to 4 days.  Drink enough fluid to keep your urine clear or pale yellow.  Cover yourmouth and nosewhen coughing or sneezing,and wash your handswellto prevent thevirusfrom spreading.  Stay homefromwork orschool untilthe fever is gonefor at least 67full day. PREVENTION  An annual influenza vaccination (flu shot) is the best way to avoid getting influenza. An annual flu shot is now routinely recommended for all adults in the [place] IF:  You experiencechest pain, yourcough worsens,or you producemore mucus.  Youhave nausea,vomiting, ordiarrhea.  Your fever returns or gets worse. SEEK IMMEDIATE MEDICAL CARE IF:  You havetrouble breathing, you become short of breath,or your skin ornails becomebluish.  You have severe painor stiffnessin the neck.  You develop a sudden headache, or pain in the face or ear.  You have nausea or vomiting that you cannot control. MAKE SURE YOU:   Understand these instructions.  Will watch your condition.  Will get help right away if you are not doing well or get worse.   This information is not intended to replace advice given to you by your health care provider. Make sure you discuss any questions you have with your health care provider.   Document Released: 10/23/2000 Document Revised: 11/16/2014 Document Reviewed: 01/25/2012 Elsevier Interactive Patient Education 2016 [employer].   Take Tamiflu one twice daily for 5 days  Drink plenty of fluids and get enough rest  Take the Hycodan cough syrup 1 teaspoon every 4-6 hours as needed for cough  Take Tylenol 500 mg 2 pills 3 times daily as needed for fever or chills or body aches (acetaminophen)  Take ibuprofen 200 mg 3 pills a times daily if needed for additional relief of fever or aching  Return if worse

## 2015-12-26 NOTE — Progress Notes (Signed)
Patient ID: Gina Harrell, female    DOB: [birth year]  Age: 75 y.o. MRN: [id number]  Chief Complaint  Patient presents with  . Influenza    x 3 days    Subjective:   75 year old lady who has been sick since Tuesday with flulike symptoms. She has terrible body aches, so sore that it was hard to get out of bed. She did have a headache. This morning she took her temperature was 103. She has had some nausea today. She is a smoker, but has not been smoking since she got sick. She has not been able to eat. She did get a flu shot this year. She has had head congestion and coughing and body aches.  Current allergies, medications, problem list, past/family and social histories reviewed.  Objective:  BP 116/75 mmHg  Pulse 77  Temp(Src) 98.8 F (37.1 C) (Oral)  Resp 18  Ht 5' (1.524 m)  Wt 108 lb 6.4 oz (49.17 kg)  BMI 21.17 kg/m2  SpO2 96%  Looks like she feels poorly. TMs are normal. Nose mildly congested. Throat clear. Neck supple without significant nodes. Chest is clear to auscultation. Heart regular with a grade 2/6 systolic murmur. Abdomen soft without mass or tenderness.  She does have a history of aortic stenosis, followed by a cardiologist at [facility]. It is stable year to year.  Assessment & Plan:   Assessment: 1. Flu-like symptoms   2. Cough   3. Fever, unspecified fever cause   4. Influenza due to identified novel influenza A virus with other respiratory manifestations       Plan: Swab  Orders Placed This Encounter  Procedures  . POCT Influenza A/B    Meds ordered this encounter  Medications  . oseltamivir (TAMIFLU) 75 MG capsule    Sig: Take 1 capsule (75 mg total) by mouth 2 (two) times daily.    Dispense:  10 capsule    Refill:  0  . HYDROcodone-homatropine (HYCODAN) 5-1.5 MG/5ML syrup    Sig: Take 5 mLs by mouth every 4 (four) hours as needed.    Dispense:  120 mL    Refill:  0   Results for orders placed or performed in visit  on 09/15/13  Wound culture  Result Value Ref Range   Gram Stain No WBC Seen    Gram Stain No Squamous Epithelial Cells Seen    Gram Stain No Organisms Seen    Organism ID, Bacteria NO GROWTH 2 DAYS         Patient Instructions  Influenza, Adult Influenza ("the flu") is a viral infection of the respiratory tract. It occurs more often in winter months because people spend more time in close contact with one another. Influenza can make you feel very sick. Influenza easily spreads from person to person (contagious). CAUSES  Influenza is caused by a virus that infects the respiratory tract. You can catch the virus by breathing in droplets from an infected person's cough or sneeze. You can also catch the virus by touching something that was recently contaminated with the virus and then touching your mouth, nose, or eyes. RISKS AND COMPLICATIONS You may be at risk for a more severe case of influenza if you smoke cigarettes, have diabetes, have chronic heart disease (such as heart failure) or lung disease (such as asthma), or if you have a weakened immune system. Elderly people and pregnant women are also at risk for more serious infections. The most common problem of influenza is  a lung infection (pneumonia). Sometimes, this problem can require emergency medical care and may be life threatening. SIGNS AND SYMPTOMS  Symptoms typically last 4 to 10 days and may include:  Fever.  Chills.  Headache, body aches, and muscle aches.  Sore throat.  Chest discomfort and cough.  Poor appetite.  Weakness or feeling tired.  Dizziness.  Nausea or vomiting. DIAGNOSIS  Diagnosis of influenza is often made based on your history and a physical exam. A nose or throat swab test can be done to confirm the diagnosis. TREATMENT  In mild cases, influenza goes away on its own. Treatment is directed at relieving symptoms. For more severe cases, your health care provider may prescribe antiviral medicines to  shorten the sickness. Antibiotic medicines are not effective because the infection is caused by a virus, not by bacteria. HOME CARE INSTRUCTIONS  Take medicines only as directed by your health care provider.  Use a cool mist humidifier to make breathing easier.  Get plenty of rest until your temperature returns to normal. This usually takes 3 to 4 days.  Drink enough fluid to keep your urine clear or pale yellow.  Cover yourmouth and nosewhen coughing or sneezing,and wash your handswellto prevent thevirusfrom spreading.  Stay homefromwork orschool untilthe fever is gonefor at least 64full day. PREVENTION  An annual influenza vaccination (flu shot) is the best way to avoid getting influenza. An annual flu shot is now routinely recommended for all adults in the [place] SEEK MEDICAL CARE IF:  You experiencechest pain, yourcough worsens,or you producemore mucus.  Youhave nausea,vomiting, ordiarrhea.  Your fever returns or gets worse. SEEK IMMEDIATE MEDICAL CARE IF:  You havetrouble breathing, you become short of breath,or your skin ornails becomebluish.  You have severe painor stiffnessin the neck.  You develop a sudden headache, or pain in the face or ear.  You have nausea or vomiting that you cannot control. MAKE SURE YOU:   Understand these instructions.  Will watch your condition.  Will get help right away if you are not doing well or get worse.   This information is not intended to replace advice given to you by your health care provider. Make sure you discuss any questions you have with your health care provider.   Document Released: 10/23/2000 Document Revised: 11/16/2014 Document Reviewed: 01/25/2012 Elsevier Interactive Patient Education 2016 [employer].   Take Tamiflu one twice daily for 5 days  Drink plenty of fluids and get enough rest  Take the Hycodan cough syrup 1 teaspoon every 4-6 hours as needed for cough  Take Tylenol 500 mg  2 pills 3 times daily as needed for fever or chills or body aches (acetaminophen)  Take ibuprofen 200 mg 3 pills a times daily if needed for additional relief of fever or aching  Return if worse      Return if symptoms worsen or fail to improve.   HOPPER,DAVID, MD 12/26/2015

## 2016-01-06 ENCOUNTER — Ambulatory Visit (INDEPENDENT_AMBULATORY_CARE_PROVIDER_SITE_OTHER): Payer: Medicare Other | Admitting: Physician Assistant

## 2016-01-06 VITALS — BP 112/78 | HR 81 | Temp 98.2°F | Resp 17 | Ht 59.5 in | Wt 107.0 lb

## 2016-01-06 DIAGNOSIS — H6123 Impacted cerumen, bilateral: Secondary | ICD-10-CM

## 2016-01-06 NOTE — Patient Instructions (Signed)
Cerumen Impaction The structures of the external ear canal secrete a waxy substance known as cerumen. Excess cerumen can build up in the ear canal, causing a condition known as cerumen impaction. Cerumen impaction can cause ear pain and disrupt the function of the ear. The rate of cerumen production differs for each individual. In certain individuals, the configuration of the ear canal may decrease his or her ability to naturally remove cerumen. CAUSES Cerumen impaction is caused by excessive cerumen production or buildup. RISK FACTORS  Frequent use of swabs to clean ears.  Having narrow ear canals.  Having eczema.  Being dehydrated. SIGNS AND SYMPTOMS  Diminished hearing.  Ear drainage.  Ear pain.  Ear itch. TREATMENT Treatment may involve:  Over-the-counter or prescription ear drops to soften the cerumen.  Removal of cerumen by a health care provider. This may be done with:  Irrigation with warm water. This is the most common method of removal.  Ear curettes and other instruments.  Surgery. This may be done in severe cases. HOME CARE INSTRUCTIONS  Take medicines only as directed by your health care provider.  Do not insert objects into the ear with the intent of cleaning the ear. PREVENTION  Do not insert objects into the ear, even with the intent of cleaning the ear. Removing cerumen as a part of normal hygiene is not necessary, and the use of swabs in the ear canal is not recommended.  Drink enough water to keep your urine clear or pale yellow.  Control your eczema if you have it. SEEK MEDICAL CARE IF:  You develop ear pain.  You develop bleeding from the ear.  The cerumen does not clear after you use ear drops as directed.   This information is not intended to replace advice given to you by your health care provider. Make sure you discuss any questions you have with your health care provider.   Document Released: 12/03/2004 Document Revised: 11/16/2014  Document Reviewed: 06/12/2015 Elsevier Interactive Patient Education 2016 [employer]  

## 2016-01-06 NOTE — Progress Notes (Signed)
[facility] [address], [place] [address] [phone]  Date:  01/06/2016   Name:  Gina Harrell   DOB:  30-Mar-1941   MRN:  [id number]  PCP:  Minda Meo, MD    History of Present Illness:  Gina Harrell is a 75 y.o. female patient who presents to [facility] for cc of hearing loss and ear discomfort. Started in the last 2 days where she could not hear from her left ear.  There is also discomfort like an aching in that ear.  She has noticed some wax build up and attempted to clean with debrox, but has not been successful.  Her job requires her to be on the phone, and she has had terrible difficulty with this at this time.  Right ear has mild hearing loss though not that bad.  She has no discomfort of the right ear. She was seen 11 days ago for influenza, which she reports as horrible.  She noted no otalgia during that time, and reports no drainage.   Patient Active Problem List   Diagnosis Date Noted  . Aortic stenosis 01/11/2014  . Hypercholesteremia 01/11/2014  . Tobacco abuse 01/11/2014  . HTN (hypertension) 06/05/2012  . Uterine prolapse   . Cancer (HCC)   . Osteoporosis     Past Medical History  Diagnosis Date  . Uterine prolapse   . Cancer [facility])     Bladder cancer  . Hypertension   . Elevated cholesterol   . Osteoporosis     Past Surgical History  Procedure Laterality Date  . Vaginal hysterectomy    . Bladder tumor excision    . Carotid doppler  2013  . Doppler echocardiography  2013    Social History  Substance Use Topics  . Smoking status: Current Every Day Smoker -- 0.50 packs/day for 50 years    Types: Cigarettes  . Smokeless tobacco: None  . Alcohol Use: 4.2 oz/week    7 Standard drinks or equivalent per week    Family History  Problem Relation Age of Onset  . Cancer Mother     Colon cancer  . Hypertension Mother   . Heart disease Father   . Breast cancer Maternal Aunt     Age 72's  . Diabetes Maternal  Grandmother     No Known Allergies  Medication list has been reviewed and updated.  Current Outpatient Prescriptions on File Prior to Visit  Medication Sig Dispense Refill  . ALPRAZolam (XANAX) 0.5 MG tablet Take 0.5 mg by mouth at bedtime as needed.      Marland Kitchen aspirin 81 MG tablet Take 81 mg by mouth daily. Reported on 12/26/2015    . Cholecalciferol (VITAMIN D-3) 1000 UNITS CAPS Take 1 capsule by mouth daily. Reported on 12/26/2015    . diphenhydramine-acetaminophen (TYLENOL PM) 25-500 MG TABS Take 1 tablet by mouth at bedtime as needed. Reported on 12/26/2015    . HYDROcodone-homatropine (HYCODAN) 5-1.5 MG/5ML syrup Take 5 mLs by mouth every 4 (four) hours as needed. 120 mL 0  . levothyroxine (SYNTHROID, LEVOTHROID) 50 MCG tablet Take 50 mcg by mouth daily before breakfast. Reported on 12/26/2015    . lovastatin (MEVACOR) 20 MG tablet Take 40 mg by mouth daily. Reported on 12/26/2015     No current facility-administered medications on file prior to visit.    ROS ROS otherwise unremarkable unless listed above.   Physical Examination: BP 112/78 mmHg  Pulse 81  Temp(Src) 98.2 F (36.8 C) (  Oral)  Resp 17  Ht 4' 11.5" (1.511 m)  Wt 107 lb (48.535 kg)  BMI 21.26 kg/m2  SpO2 97% Ideal Body Weight: Weight in (lb) to have BMI = 25: 125.6  Physical Exam  Constitutional: She is oriented to person, place, and time. She appears well-developed and well-nourished. No distress.  HENT:  Head: Normocephalic and atraumatic.  Right Ear: External ear and ear canal normal.  Left Ear: External ear and ear canal normal.  Bilateral cerumen impaction more so at the left ear.  Post-irrigation.  Tm are normal.   Eyes: Conjunctivae and EOM are normal. Pupils are equal, round, and reactive to light.  Cardiovascular: Normal rate.   Pulmonary/Chest: Effort normal. No respiratory distress.  Neurological: She is alert and oriented to person, place, and time.  Skin: She is not diaphoretic.  Psychiatric: She  has a normal mood and affect. Her behavior is normal.     Assessment and Plan: Gina Harrell is a 75 y.o. female who is here today hearing loss and discomfort of both ears.  Cerumen impaction, bilateral    Trena Platt, PA-C [facility] 01/06/2016 7:29 PM

## 2016-01-07 DIAGNOSIS — H6120 Impacted cerumen, unspecified ear: Secondary | ICD-10-CM | POA: Insufficient documentation

## 2016-01-08 ENCOUNTER — Other Ambulatory Visit: Payer: Self-pay | Admitting: Gastroenterology

## 2016-02-11 ENCOUNTER — Encounter (HOSPITAL_COMMUNITY): Payer: Self-pay | Admitting: *Deleted

## 2016-02-17 ENCOUNTER — Ambulatory Visit (HOSPITAL_COMMUNITY)
Admission: RE | Admit: 2016-02-17 | Discharge: 2016-02-17 | Disposition: A | Discharge status: Home / Self Care | Payer: Medicare Other | Source: Ambulatory Visit | Attending: Gastroenterology | Admitting: Gastroenterology

## 2016-02-17 ENCOUNTER — Ambulatory Visit (HOSPITAL_COMMUNITY): Payer: Medicare Other | Admitting: Certified Registered Nurse Anesthetist

## 2016-02-17 ENCOUNTER — Encounter (HOSPITAL_COMMUNITY)
Admission: RE | Disposition: A | Discharge status: Home / Self Care | Payer: Self-pay | Source: Ambulatory Visit | Attending: Gastroenterology

## 2016-02-17 ENCOUNTER — Encounter (HOSPITAL_COMMUNITY): Payer: Self-pay | Admitting: *Deleted

## 2016-02-17 DIAGNOSIS — Z1211 Encounter for screening for malignant neoplasm of colon: Secondary | ICD-10-CM | POA: Diagnosis not present

## 2016-02-17 DIAGNOSIS — F17200 Nicotine dependence, unspecified, uncomplicated: Secondary | ICD-10-CM | POA: Diagnosis not present

## 2016-02-17 DIAGNOSIS — Z7982 Long term (current) use of aspirin: Secondary | ICD-10-CM | POA: Insufficient documentation

## 2016-02-17 DIAGNOSIS — J449 Chronic obstructive pulmonary disease, unspecified: Secondary | ICD-10-CM | POA: Insufficient documentation

## 2016-02-17 DIAGNOSIS — Z86010 Personal history of colonic polyps: Secondary | ICD-10-CM | POA: Diagnosis not present

## 2016-02-17 DIAGNOSIS — R1013 Epigastric pain: Secondary | ICD-10-CM | POA: Diagnosis not present

## 2016-02-17 DIAGNOSIS — I10 Essential (primary) hypertension: Secondary | ICD-10-CM | POA: Diagnosis not present

## 2016-02-17 DIAGNOSIS — K635 Polyp of colon: Secondary | ICD-10-CM | POA: Insufficient documentation

## 2016-02-17 DIAGNOSIS — Z8551 Personal history of malignant neoplasm of bladder: Secondary | ICD-10-CM | POA: Insufficient documentation

## 2016-02-17 HISTORY — DX: Gastro-esophageal reflux disease without esophagitis: K21.9

## 2016-02-17 HISTORY — DX: Cardiac murmur, unspecified: R01.1

## 2016-02-17 HISTORY — DX: Hypothyroidism, unspecified: E03.9

## 2016-02-17 HISTORY — PX: ESOPHAGOGASTRODUODENOSCOPY (EGD) WITH PROPOFOL: SHX5813

## 2016-02-17 HISTORY — PX: COLONOSCOPY WITH PROPOFOL: SHX5780

## 2016-02-17 SURGERY — COLONOSCOPY WITH PROPOFOL
Anesthesia: Monitor Anesthesia Care

## 2016-02-17 MED ORDER — PROPOFOL 10 MG/ML IV BOLUS
INTRAVENOUS | Status: DC | PRN
  Administered 2016-02-17: 10 mg via INTRAVENOUS
  Administered 2016-02-17 (×2): 20 mg via INTRAVENOUS
  Administered 2016-02-17 (×4): 10 mg via INTRAVENOUS
  Administered 2016-02-17: 20 mg via INTRAVENOUS
  Administered 2016-02-17: 30 mg via INTRAVENOUS

## 2016-02-17 MED ORDER — LIDOCAINE HCL (CARDIAC) 20 MG/ML IV SOLN
INTRAVENOUS | Status: AC
  Filled 2016-02-17: qty 5

## 2016-02-17 MED ORDER — ONDANSETRON HCL 4 MG/2ML IJ SOLN
INTRAMUSCULAR | Status: AC
  Filled 2016-02-17: qty 2

## 2016-02-17 MED ORDER — PROPOFOL 10 MG/ML IV BOLUS
INTRAVENOUS | Status: AC
  Filled 2016-02-17: qty 60

## 2016-02-17 MED ORDER — ONDANSETRON HCL 4 MG/2ML IJ SOLN
INTRAMUSCULAR | Status: DC | PRN
  Administered 2016-02-17: 4 mg via INTRAVENOUS

## 2016-02-17 MED ORDER — SODIUM CHLORIDE 0.9 % IV SOLN: INTRAVENOUS | Status: DC

## 2016-02-17 MED ORDER — LACTATED RINGERS IV SOLN
INTRAVENOUS | Status: DC
  Administered 2016-02-17: 11:00:00 via INTRAVENOUS

## 2016-02-17 MED ORDER — LIDOCAINE HCL (CARDIAC) 20 MG/ML IV SOLN
INTRAVENOUS | Status: DC | PRN
  Administered 2016-02-17: 50 mg via INTRAVENOUS

## 2016-02-17 MED ORDER — PROPOFOL 500 MG/50ML IV EMUL
INTRAVENOUS | Status: DC | PRN
  Administered 2016-02-17: 75 ug/kg/min via INTRAVENOUS

## 2016-02-17 SURGICAL SUPPLY — 24 items

## 2016-02-17 NOTE — Anesthesia Postprocedure Evaluation (Signed)
Anesthesia Post Note  Patient: Gina Harrell  Procedure(s) Performed: Procedure(s) (LRB): COLONOSCOPY WITH PROPOFOL (N/A) ESOPHAGOGASTRODUODENOSCOPY (EGD) WITH PROPOFOL (N/A)  Patient location during evaluation: Endoscopy Anesthesia Type: MAC Level of consciousness: awake and alert Pain management: pain level controlled Vital Signs Assessment: post-procedure vital signs reviewed and stable Respiratory status: spontaneous breathing, nonlabored ventilation, respiratory function stable and patient connected to nasal cannula oxygen Cardiovascular status: stable and blood pressure returned to baseline Anesthetic complications: no    Last Vitals:  Filed Vitals:   02/17/16 1220 02/17/16 1225  BP: 130/63 138/71  Pulse: 57 65  Temp:    Resp: 14 11    Last Pain: There were no vitals filed for this visit.               Montez Hageman

## 2016-02-17 NOTE — Op Note (Signed)
[facility] Patient Name: Gina Harrell Procedure Date: 02/17/2016 MRN: [id number] Attending MD: Charolett Bumpers , MD Date of Birth: [birth year] CSN:  Age: 75 Admit Type: Outpatient Procedure:                Upper GI endoscopy Indications:              Epigastric abdominal pain Providers:                Charolett Bumpers, MD, Omelia Blackwater, RN, Oletha Blend, Technician Referring MD:              Medicines:                Propofol per Anesthesia Complications:            No immediate complications. Estimated Blood Loss:     Estimated blood loss: none. Procedure:                Pre-Anesthesia Assessment:                           - Prior to the procedure, a History and Physical                            was performed, and patient medications and                            allergies were reviewed. The patient's tolerance of                            previous anesthesia was also reviewed. The risks                            and benefits of the procedure and the sedation                            options and risks were discussed with the patient.                            All questions were answered, and informed consent                            was obtained. Prior Anticoagulants: The patient has                            taken aspirin, last dose was day of procedure. ASA                            Grade Assessment: II - A patient with mild systemic                            disease. After reviewing the risks and benefits,  the patient was deemed in satisfactory condition to                            undergo the procedure.                           - Prior to the procedure, a History and Physical                            was performed, and patient medications and                            allergies were reviewed. The patient's tolerance of                            previous anesthesia was also  reviewed. The risks                            and benefits of the procedure and the sedation                            options and risks were discussed with the patient.                            All questions were answered, and informed consent                            was obtained. Prior Anticoagulants: The patient has                            taken aspirin, last dose was day of procedure. ASA                            Grade Assessment: II - A patient with mild systemic                            disease. After reviewing the risks and benefits,                            the patient was deemed in satisfactory condition to                            undergo the procedure.                           After obtaining informed consent, the endoscope was                            passed under direct vision. Throughout the                            procedure, the patient's blood pressure, pulse, and  oxygen saturations were monitored continuously. The                            [id number] ([id number]) scope was introduced through the                            mouth, and advanced to the second part of duodenum.                            The upper GI endoscopy was accomplished without                            difficulty. The patient tolerated the procedure                            well. Scope In: Scope Out: Findings:      The examined esophagus was normal. The Z line was regular and noted at       40 CM from the incisor teeth. No signs of Barrett esophagus.      The entire examined stomach was normal. Biopsies were taken with a cold       forceps for histology to look for H.pylori gastritis..      The examined duodenum was normal. Impression:               - Normal esophagus.                           - Normal stomach. Biopsied.                           - Normal examined duodenum. Moderate Sedation:      N/A- Per Anesthesia Care Recommendation:            - Await pathology results.                           - Await pathology results.                           - Resume previous diet.                           - Continue present medications. Procedure Code(s):        --- Professional ---                           [phone], Esophagogastroduodenoscopy, flexible,                            transoral; with biopsy, single or multiple Diagnosis Code(s):        --- Professional ---                           R10.13, Epigastric pain CPT copyright 2016 American Medical Association. All rights reserved. The codes documented in this report are preliminary and upon coder review may  be revised to meet current compliance requirements. Danise Edge, MD Charolett Bumpers, MD 02/17/2016  12:04:44 PM This report has been signed electronically. Number of Addenda: 0

## 2016-02-17 NOTE — Anesthesia Preprocedure Evaluation (Signed)
Anesthesia Evaluation  Patient identified by MRN, date of birth, ID band Patient awake    Reviewed: Allergy & Precautions, NPO status , Patient's Chart, lab work & pertinent test results  Airway Mallampati: II  TM Distance: >3 FB Neck ROM: Full    Dental no notable dental hx.    Pulmonary Current Smoker,    Pulmonary exam normal breath sounds clear to auscultation       Cardiovascular hypertension, Pt. on medications Normal cardiovascular exam Rhythm:Regular Rate:Normal     Neuro/Psych negative neurological ROS  negative psych ROS   GI/Hepatic negative GI ROS, Neg liver ROS,   Endo/Other  Hypothyroidism   Renal/GU negative Renal ROS  negative genitourinary   Musculoskeletal negative musculoskeletal ROS (+)   Abdominal   Peds negative pediatric ROS (+)  Hematology negative hematology ROS (+)   Anesthesia Other Findings   Reproductive/Obstetrics negative OB ROS                             Anesthesia Physical Anesthesia Plan  ASA: II  Anesthesia Plan: MAC   Post-op Pain Management:    Induction: Intravenous  Airway Management Planned: Nasal Cannula and Simple Face Mask  Additional Equipment:   Intra-op Plan:   Post-operative Plan:   Informed Consent: I have reviewed the patients History and Physical, chart, labs and discussed the procedure including the risks, benefits and alternatives for the proposed anesthesia with the patient or authorized representative who has indicated his/her understanding and acceptance.   Dental advisory given  Plan Discussed with: CRNA  Anesthesia Plan Comments:         Anesthesia Quick Evaluation

## 2016-02-17 NOTE — Op Note (Signed)
[facility] Patient Name: Gina Harrell Procedure Date: 02/17/2016 MRN: [id number] Attending MD: Charolett Bumpers , MD Date of Birth: Nov 21, 1940 CSN:  Age: 75 Admit Type: Outpatient Procedure:                Colonoscopy Indications:              High risk colon cancer surveillance: Personal                            history of adenoma less than 10 mm in size Providers:                Charolett Bumpers, MD, Omelia Blackwater, RN, Oletha Blend, Technician Referring MD:              Medicines:                Propofol per Anesthesia Complications:            No immediate complications. Estimated Blood Loss:     Estimated blood loss: none. Procedure:                Pre-Anesthesia Assessment:                           - Prior to the procedure, a History and Physical                            was performed, and patient medications and                            allergies were reviewed. The patient's tolerance of                            previous anesthesia was also reviewed. The risks                            and benefits of the procedure and the sedation                            options and risks were discussed with the patient.                            All questions were answered, and informed consent                            was obtained. Prior Anticoagulants: The patient has                            taken aspirin, last dose was day of procedure. ASA                            Grade Assessment: II - A patient with mild systemic  disease. After reviewing the risks and benefits,                            the patient was deemed in satisfactory condition to                            undergo the procedure.                           After obtaining informed consent, the colonoscope                            was passed under direct vision. Throughout the                            procedure, the  patient's blood pressure, pulse, and                            oxygen saturations were monitored continuously. The                            was introduced through the anus and advanced to the                            the cecum, identified by appendiceal orifice and                            ileocecal valve. The was introduced through the and                            advanced to the. The colonoscopy was performed                            without difficulty. The patient tolerated the                            procedure well. The quality of the bowel                            preparation was good. The appendiceal orifice and                            the rectum were photographed. Scope In: 11:37:07 AM Scope Out: 11:54:37 AM Scope Withdrawal Time: 0 hours 9 minutes 46 seconds  Total Procedure Duration: 0 hours 17 minutes 30 seconds  Findings:      The perianal and digital rectal examinations were normal.      Two sessile polyps were found in the distal sigmoid colon. The polyps       were 3 mm in size. These polyps were removed with a cold biopsy forceps.       Resection and retrieval were complete.      The exam was otherwise without abnormality. Impression:               - Two 3 mm polyps in the  distal sigmoid colon,                            removed with a cold biopsy forceps. Resected and                            retrieved.                           - The examination was otherwise normal. Moderate Sedation:      N/A- Per Anesthesia Care Recommendation:           - Patient has a contact number available for                            emergencies. The signs and symptoms of potential                            delayed complications were discussed with the                            patient. Return to normal activities tomorrow.                            Written discharge instructions were provided to the                            patient.                           -  Repeat colonoscopy in 5 years for surveillance.                           - Resume previous diet.                           - Continue present medications. Procedure Code(s):        --- Professional ---                           [phone], Colonoscopy, flexible; with biopsy, single                            or multiple Diagnosis Code(s):        --- Professional ---                           Z86.010, Personal history of colonic polyps                           D12.5, Benign neoplasm of sigmoid colon CPT copyright 2016 American Medical Association. All rights reserved. The codes documented in this report are preliminary and upon coder review may  be revised to meet current compliance requirements. Danise Edge, MD Charolett Bumpers, MD 02/17/2016 12:00:26 PM This report has been signed electronically. Number of Addenda: 0

## 2016-02-17 NOTE — H&P (Signed)
Procedures: Diagnostic esophagogastroduodenoscopy to evaluate epigastric pain and surveillance colonoscopy. 2003 and November 2011 normal surveillance colonoscopies were performed. Small adenomatous colon polyps were removed colonoscopically in 1999 and 2008. Mother diagnosed with colon cancer in her 96s.  History: The patient is a 75 year old female born [birth year]. She has burning epigastric discomfort despite stopping Aleve. She takes a baby aspirin daily. She is scheduled to undergo diagnostic esophagogastroduodenoscopy.  Adenomatous colon polyps have been removed colonoscopically in the past. She is scheduled to undergo a repeat surveillance colonoscopy today.  Past medical history: Chronic anxiety. Hypercholesterolemia. Chronic obstructive pulmonary disease. Small liver hemangioma. Irritable bowel syndrome. Nocturnal leg cramps. Vitamin D deficiency. Osteoporosis. Bladder cancer diagnosed in 2002. Hypertension. Vaginal hysterectomy. Nasal surgery. Resection of bladder cancer.  Exam: The patient is alert and lying comfortably on the endoscopy stretcher. Abdomen is soft and nontender to palpation. She has been experiencing right lower quadrant abdominal pain. Cardiac exam reveals a regular rhythm. Lungs are clear to auscultation.  Plan: Proceed with diagnostic esophagogastroduodenoscopy followed by surveillance colonoscopy

## 2016-02-17 NOTE — Transfer of Care (Signed)
Immediate Anesthesia Transfer of Care Note  Patient: Gina Harrell  Procedure(s) Performed: Procedure(s): COLONOSCOPY WITH PROPOFOL (N/A) ESOPHAGOGASTRODUODENOSCOPY (EGD) WITH PROPOFOL (N/A)  Patient Location: ENDO  Anesthesia Type:MAC  Level of Consciousness:  sedated, patient cooperative and responds to stimulation  Airway & Oxygen Therapy:Patient Spontanous Breathing and Patient connected to face mask oxgen  Post-op Assessment:  Report given to ENDO RN and Post -op Vital signs reviewed and stable  Post vital signs:  Reviewed and stable  Last Vitals:  Filed Vitals:   02/17/16 1007 02/17/16 1203  BP: 154/82 116/47  Pulse: 75 70  Temp: 36.6 C   Resp: 15 13    Complications: No apparent anesthesia complications

## 2016-02-17 NOTE — Discharge Instructions (Signed)
Colonoscopy, Care After °These instructions give you information on caring for yourself after your procedure. Your doctor may also give you more specific instructions. Call your doctor if you have any problems or questions after your procedure. °HOME CARE °· Do not drive for 24 hours. °· Do not sign important papers or use machinery for 24 hours. °· You may shower. °· You may go back to your usual activities, but go slower for the first 24 hours. °· Take rest breaks often during the first 24 hours. °· Walk around or use warm packs on your belly (abdomen) if you have belly cramping or gas. °· Drink enough fluids to keep your pee (urine) clear or pale yellow. °· Resume your normal diet. Avoid heavy or fried foods. °· Avoid drinking alcohol for 24 hours or as told by your doctor. °· Only take medicines as told by your doctor. °If a tissue sample (biopsy) was taken during the procedure:  °· Do not take aspirin or blood thinners for 7 days, or as told by your doctor. °· Do not drink alcohol for 7 days, or as told by your doctor. °· Eat soft foods for the first 24 hours. °GET HELP IF: °You still have a small amount of blood in your poop (stool) 2-3 days after the procedure. °GET HELP RIGHT AWAY IF: °· You have more than a small amount of blood in your poop. °· You see clumps of tissue (blood clots) in your poop. °· Your belly is puffy (swollen). °· You feel sick to your stomach (nauseous) or throw up (vomit). °· You have a fever. °· You have belly pain that gets worse and medicine does not help. °MAKE SURE YOU: °· Understand these instructions. °· Will watch your condition. °· Will get help right away if you are not doing well or get worse. °  °This information is not intended to replace advice given to you by your health care provider. Make sure you discuss any questions you have with your health care provider. °  °Document Released: 11/28/2010 Document Revised: 10/31/2013 Document Reviewed: 07/03/2013 °Elsevier  Interactive Patient Education ©2016 Elsevier Inc[employer] °Esophagogastroduodenoscopy, Care After °Refer to this sheet in the next few weeks. These instructions provide you with information about caring for yourself after your procedure. Your health care provider may also give you more specific instructions. Your treatment has been planned according to current medical practices, but problems sometimes occur. Call your health care provider if you have any problems or questions after your procedure. °WHAT TO EXPECT AFTER THE PROCEDURE °After your procedure, it is typical to feel: °· Soreness in your throat. °· Pain with swallowing. °· Sick to your stomach (nauseous). °· Bloated. °· Dizzy. °· Fatigued. °HOME CARE INSTRUCTIONS °· Do not eat or drink anything until the numbing medicine (local anesthetic) has worn off and your gag reflex has returned. You will know that the local anesthetic has worn off when you can swallow comfortably. °· Do not drive or operate machinery until directed by your health care provider. °· Take medicines only as directed by your health care provider. °SEEK MEDICAL CARE IF:  °· You cannot stop coughing. °· You are not urinating at all or less than usual. °SEEK IMMEDIATE MEDICAL CARE IF: °· You have difficulty swallowing. °· You cannot eat or drink. °· You have worsening throat or chest pain. °· You have dizziness or lightheadedness or you faint. °· You have nausea or vomiting. °· You have chills. °· You have a fever. °·   You have severe abdominal pain. °· You have black, tarry, or bloody stools. °  °This information is not intended to replace advice given to you by your health care provider. Make sure you discuss any questions you have with your health care provider. °  °Document Released: 10/12/2012 Document Revised: 11/16/2014 Document Reviewed: 10/12/2012 °Elsevier Interactive Patient Education ©2016 Elsevier Inc. ° °

## 2016-02-19 ENCOUNTER — Encounter (HOSPITAL_COMMUNITY): Payer: Self-pay | Admitting: Gastroenterology

## 2016-02-24 ENCOUNTER — Other Ambulatory Visit: Payer: Self-pay | Admitting: Internal Medicine

## 2016-02-24 ENCOUNTER — Ambulatory Visit
Admission: RE | Admit: 2016-02-24 | Discharge: 2016-02-24 | Disposition: A | Discharge status: Home / Self Care | Payer: Medicare Other | Source: Ambulatory Visit | Attending: Internal Medicine | Admitting: Internal Medicine

## 2016-02-24 DIAGNOSIS — R1084 Generalized abdominal pain: Secondary | ICD-10-CM

## 2016-02-24 MED ORDER — IOPAMIDOL (ISOVUE-300) INJECTION 61%
100.0000 mL | Freq: Once | INTRAVENOUS | Status: AC | PRN
  Administered 2016-02-24: 100 mL via INTRAVENOUS

## 2016-03-06 ENCOUNTER — Encounter: Payer: Self-pay | Admitting: Cardiovascular Disease

## 2016-03-06 ENCOUNTER — Ambulatory Visit (INDEPENDENT_AMBULATORY_CARE_PROVIDER_SITE_OTHER): Payer: Medicare Other | Admitting: Cardiovascular Disease

## 2016-03-06 VITALS — BP 130/86 | HR 65 | Ht 60.0 in | Wt 111.4 lb

## 2016-03-06 DIAGNOSIS — Z720 Tobacco use: Secondary | ICD-10-CM

## 2016-03-06 DIAGNOSIS — E7800 Pure hypercholesterolemia, unspecified: Secondary | ICD-10-CM

## 2016-03-06 DIAGNOSIS — I350 Nonrheumatic aortic (valve) stenosis: Secondary | ICD-10-CM | POA: Diagnosis not present

## 2016-03-06 DIAGNOSIS — I10 Essential (primary) hypertension: Secondary | ICD-10-CM

## 2016-03-06 NOTE — Progress Notes (Signed)
Patient ID: Gina Harrell, female   DOB: 12-16-1940, 75 y.o.   MRN: [id number]    Cardiology Office Note    Date:  03/06/2016   ID:  Gina Harrell, DOB Dec 18, 1940, MRN [id number]  PCP:  Minda Meo, MD  Cardiologist:   Thurmon Fair, MD   Chief Complaint  Patient presents with  . Annual Exam    patient reports no complaints    History of Present Illness:  Gina Harrell is a 75 y.o. female with mild aortic stenosis, mild carotid atherosclerosis, hypertension and hyperlipidemia who returns for routine follow-up. She has not had any cardiovascular problems since her last appointment. She specifically denies exertional dyspnea, exertional angina or syncope. She has not had leg edema or claudication. She has had some persistent right lower quadrant abdominal discomfort. A CT of the abdomen failed to really disclose a diagnosis. It did show some mild vascular calcifications in the aorta, iliac arteries and visceral arteries without significant stenoses.    Gina Harrell quit smoking for roughly 3 weeks earlier this spring, but unfortunately has relapsed.She blames the stress at work for her falling off the wagon. 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  Around Valentine's Day she had a severe case of the flu and is only now feeling over it.  Her last lipid profile earlier this year in Dr. Lanell Matar office showed a total cholesterol of 192, triglycerides 71, HDL 57, LDL 121 on lovastatin 40 mg daily. Other labs include creatinine 1.1, glucose 97, hemoglobin 14.0.  Past Medical History  Diagnosis Date  . Uterine prolapse   . Cancer [facility])     Bladder cancer  . Hypertension   . Elevated cholesterol   . Osteoporosis   . Hypothyroidism   . GERD (gastroesophageal reflux disease)   . Heart murmur   . Heart murmur     Past Surgical History  Procedure  Laterality Date  . Vaginal hysterectomy    . Bladder tumor excision    . Carotid doppler  2013  . Doppler echocardiography  2013  . Colonoscopy with propofol N/A 02/17/2016    Procedure: COLONOSCOPY WITH PROPOFOL;  Surgeon: Charolett Bumpers, MD;  Location: [facility];  Service: Endoscopy;  Laterality: N/A;  . Esophagogastroduodenoscopy (egd) with propofol N/A 02/17/2016    Procedure: ESOPHAGOGASTRODUODENOSCOPY (EGD) WITH PROPOFOL;  Surgeon: Charolett Bumpers, MD;  Location: [facility];  Service: Endoscopy;  Laterality: N/A;    Current Medications: Outpatient Prescriptions Prior to Visit  Medication Sig Dispense Refill  . ALPRAZolam (XANAX) 0.5 MG tablet Take 0.5 mg by mouth at bedtime.     Marland Kitchen aspirin 81 MG tablet Take 81 mg by mouth daily. Reported on 12/26/2015    . Cholecalciferol (VITAMIN D-3) 1000 UNITS CAPS Take 1 capsule by mouth daily. Reported on 12/26/2015    . diphenhydramine-acetaminophen (TYLENOL PM) 25-500 MG TABS Take 1 tablet by mouth at bedtime. Reported on 12/26/2015    . levothyroxine (SYNTHROID, LEVOTHROID) 50 MCG tablet Take 50 mcg by mouth daily before breakfast. Reported on 12/26/2015    . lovastatin (MEVACOR) 40 MG tablet Take 40 mg by mouth at bedtime.    . pantoprazole (PROTONIX) 40 MG tablet Take 40 mg by mouth daily.    Marland Kitchen triamterene-hydrochlorothiazide (MAXZIDE-25) 37.5-25 MG tablet Take 1 tablet by mouth every other day.   8   No facility-administered medications prior to visit.  Allergies:   Review of patient's allergies indicates no known allergies.   Social History   Social History  . Marital Status: Divorced    Spouse Name: N/A  . Number of Children: N/A  . Years of Education: N/A   Social History Main Topics  . Smoking status: Current Every Day Smoker -- 0.50 packs/day for 50 years    Types: Cigarettes  . Smokeless tobacco: None  . Alcohol Use: 4.2 oz/week    7 Standard drinks or equivalent per week  . Drug Use: No  . Sexual Activity: No    Other Topics Concern  . None   Social History Narrative     Family History:  The patient's  family history includes Breast cancer in her maternal aunt; Cancer in her mother; Diabetes in her maternal grandmother; Heart disease in her father; Hypertension in her mother.   ROS:   Please see the history of present illness.    ROS All other systems reviewed and are negative.   PHYSICAL EXAM:   VS:  BP 130/86 mmHg  Pulse 65  Ht 5' (1.524 m)  Wt 50.531 kg (111 lb 6.4 oz)  BMI 21.76 kg/m2   GEN: Well nourished, well developed, in no acute distress HEENT: normal Neck: no JVD, bilateral carotid bruits L>R, no masses Cardiac: RRR; early peaking 2-3/6 aortic ejection murmur, no diastolic murmurs, rubs, or gallops,no edema  Respiratory:  clear to auscultation bilaterally, normal work of breathing GI: soft, nontender, nondistended, + BS MS: no deformity or atrophy Skin: warm and dry, no rash Neuro:  Alert and Oriented x 3, Strength and sensation are intact Psych: euthymic mood, full affect  Wt Readings from Last 3 Encounters:  03/06/16 50.531 kg (111 lb 6.4 oz)  02/17/16 48.535 kg (107 lb)  01/06/16 48.535 kg (107 lb)      Studies/Labs Reviewed:   EKG:  EKG is ordered today.  The ekg ordered today demonstrates Sinus rhythm, questionable left atrial abnormality  Recent Labs: Her last lipid profile showed a total cholesterol of 192, triglycerides 71, HDL 57, LDL 121 on lovastatin 40 mg daily. Other labs include creatinine 1.1, glucose 97, hemoglobin 14.0.    ASSESSMENT:    1. Aortic stenosis   2. Essential hypertension   3. Hypercholesteremia   4. Tobacco abuse      PLAN:  In order of problems listed above:  1. Mild aortic stenosis by exam, asymptomatic. Has not had an echocardiogram in 4 years. We'll check an echo just before her next appointment in 1 year. 2. Well-controlled blood pressure 3. LDL cholesterol ideally should be less than 100 mg/deciliter, having said  that her current LDL levels are substantially improved from baseline. 4. Smoking cessation would be the most beneficial intervention to reduce the likelihood of progression of atherosclerosis. She already has mild plaque in the carotid arteries, aorta, iliac arteries, abdominal visceral arteries and probably has coronary plaque as well although none of these are symptomatic. Reinforced the need to try to quit smoking again.   Medication Adjustments/Labs and Tests Ordered: Current medicines are reviewed at length with the patient today.  Concerns regarding medicines are outlined above.  Medication changes, Labs and Tests ordered today are listed in the Patient Instructions below. Patient Instructions  Dr Royann Shivers recommends that you continue on your current medications as directed. Please refer to the Current Medication list given to you today.  Your physician has requested that you have an echocardiogram in 1 year prior to your office  visit with Dr C. Echocardiography is a painless test that uses sound waves to create images of your heart. It provides your doctor with information about the size and shape of your heart and how well your heart's chambers and valves are working. This procedure takes approximately one hour. There are no restrictions for this procedure.   Dr Royann Shivers recommends that you schedule a follow-up appointment in 1 year. You will receive a reminder letter in the mail two months in advance. If you don't receive a letter, please call our office to schedule the follow-up appointment.  If you need a refill on your cardiac medications before your next appointment, please call your pharmacy.    Joie Bimler, MD  03/06/2016 6:30 PM    [facility] [address] [place], [place], [place]  [address] Phone: [phone]; Fax: [phone]

## 2016-03-06 NOTE — Patient Instructions (Signed)
Dr Sallyanne Kuster recommends that you continue on your current medications as directed. Please refer to the Current Medication list given to you today.  Your physician has requested that you have an echocardiogram in 1 year prior to your office visit with Dr C. Echocardiography is a painless test that uses sound waves to create images of your heart. It provides your doctor with information about the size and shape of your heart and how well your heart's chambers and valves are working. This procedure takes approximately one hour. There are no restrictions for this procedure.   Dr Sallyanne Kuster recommends that you schedule a follow-up appointment in 1 year. You will receive a reminder letter in the mail two months in advance. If you don't receive a letter, please call our office to schedule the follow-up appointment.  If you need a refill on your cardiac medications before your next appointment, please call your pharmacy.

## 2016-07-24 IMAGING — CT CT ABD-PELV W/ CM
2 of 5 series · 15 of 46 positions shown, 17 images · IV contrast (APPLIED)
Comparison: None.

CLINICAL DATA: Chronic abdominal pain. History of bladder carcinoma

EXAM:
CT ABDOMEN AND PELVIS WITH CONTRAST
TECHNIQUE: Multidetector CT imaging of the abdomen and pelvis was performed
using the standard protocol following bolus administration of
intravenous contrast.
Multidetector CT imaging of the abdomen and pelvis was performed
intravenous contrast. Oral contrast was also administered.
CONTRAST:  100mL D2G1L0-NII IOPAMIDOL (D2G1L0-NII) INJECTION 61%

[Series 2: abd/pelvis w/cm · axial · 0.61mm/px · z∈[+778,+1118]mm · 12 of 78 slices shown, 14 images]
[im 5/78  soft-tissue]
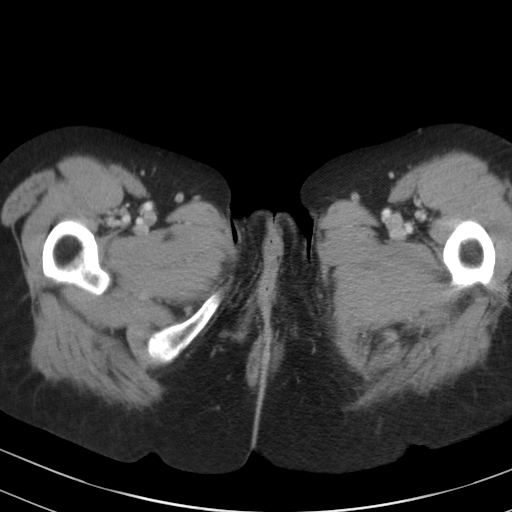
[im 5/78  bone]
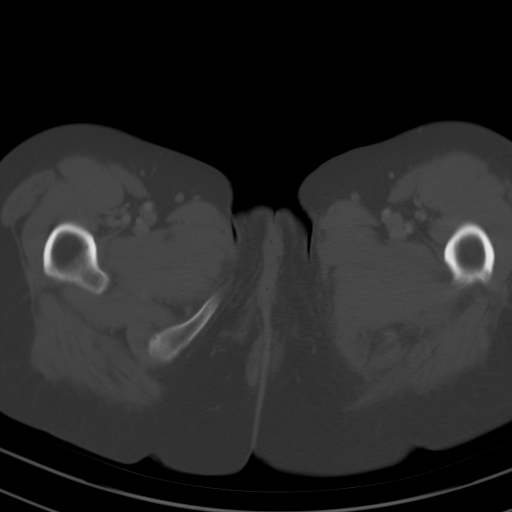
[im 13/78  soft-tissue]
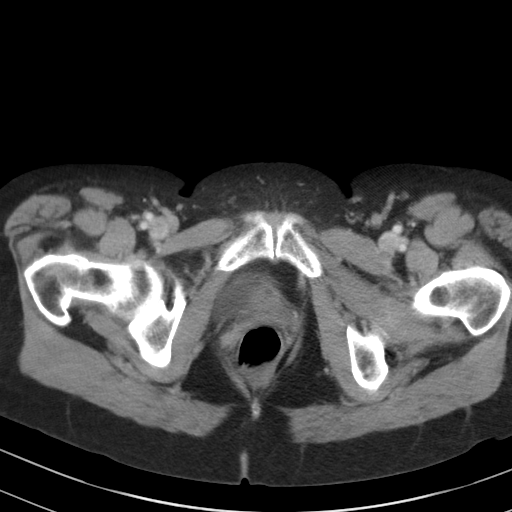
[im 18/78  soft-tissue]
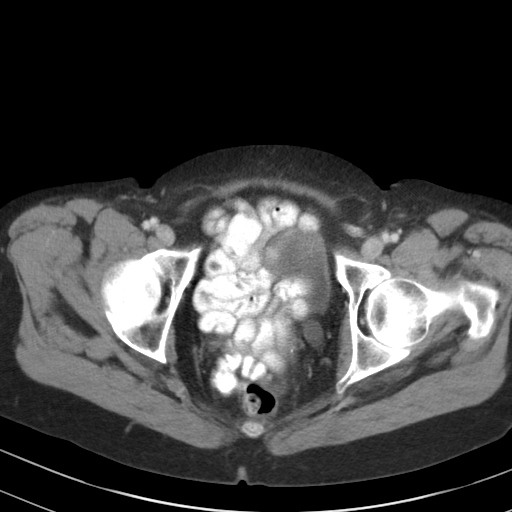
[im 22/78  soft-tissue]
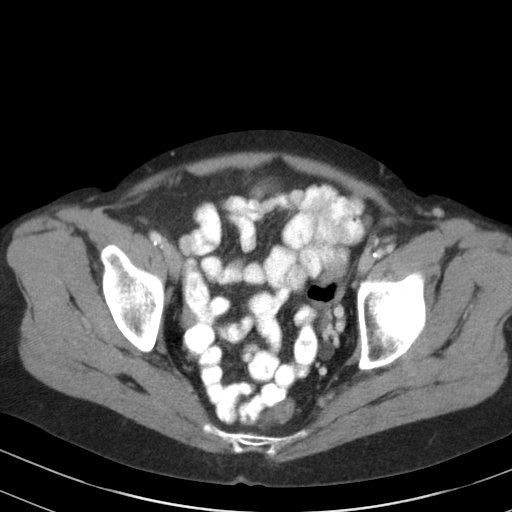
[im 30/78  soft-tissue]
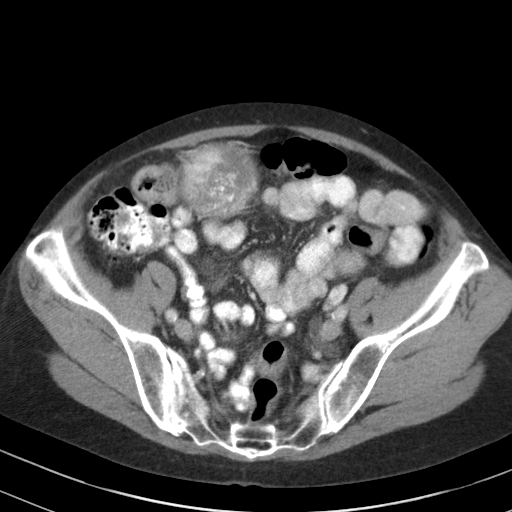
[im 35/78  soft-tissue]
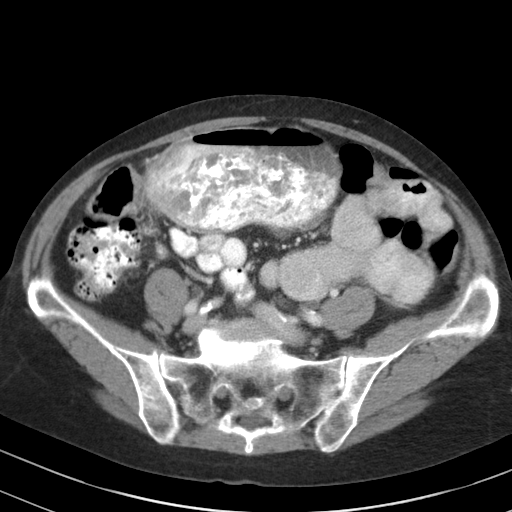
[im 43/78  soft-tissue]
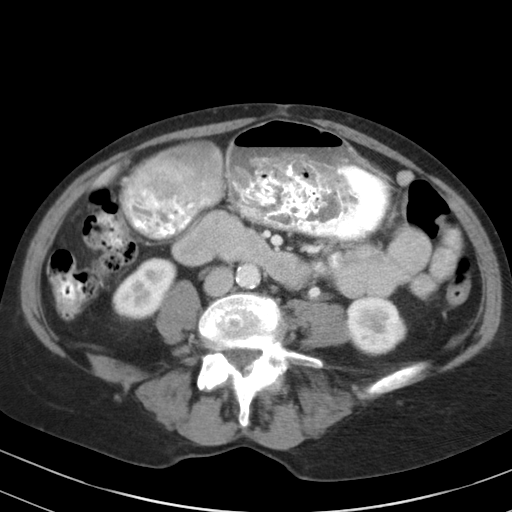
[im 48/78  soft-tissue]
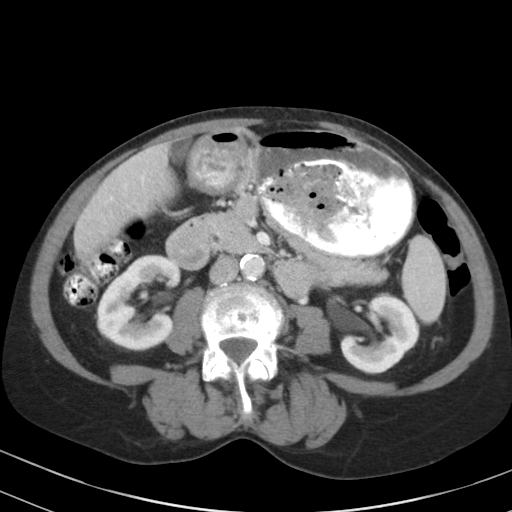
[im 56/78  soft-tissue]
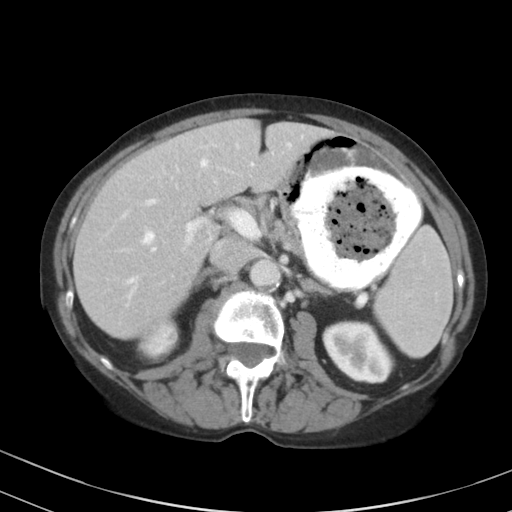
[im 56/78  bone]
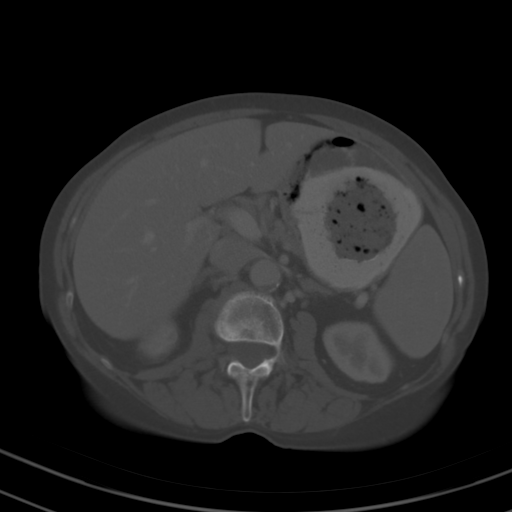
[im 60/78  soft-tissue]
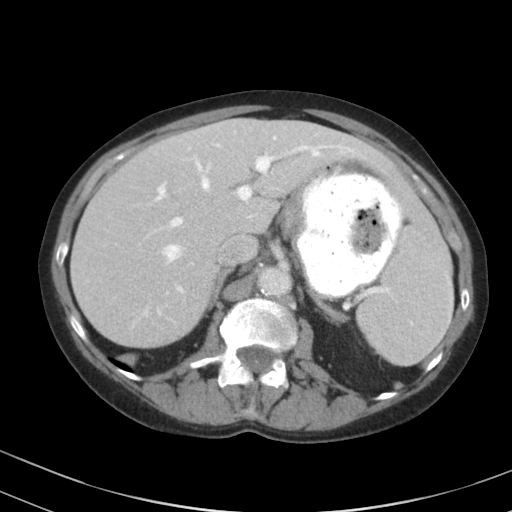
[im 65/78  soft-tissue]
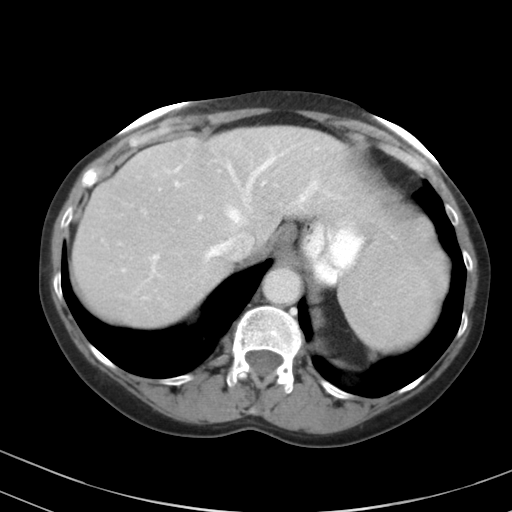
[im 73/78  soft-tissue]
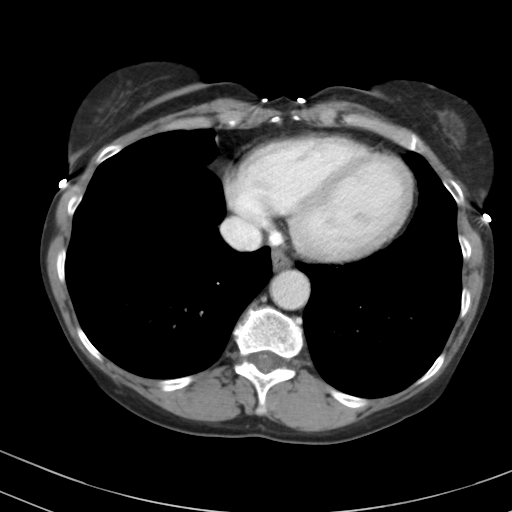

[Series 3: cor · coronal · 0.61mm/px · 3 of 77 slices shown]
[im 26/77  soft-tissue]
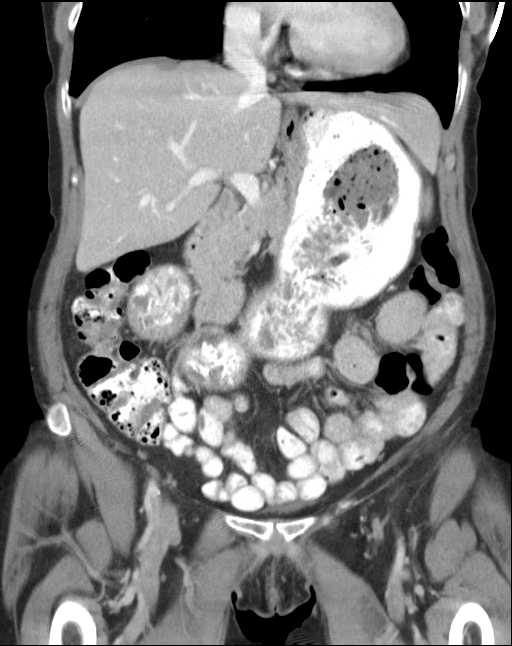
[im 34/77  soft-tissue]
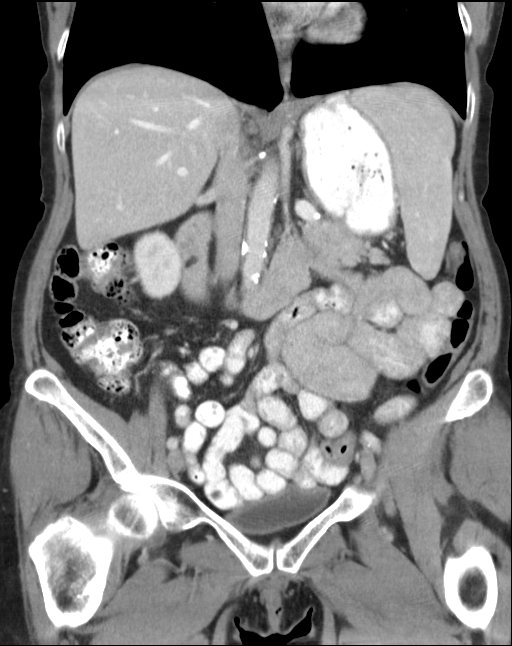
[im 43/77  soft-tissue]
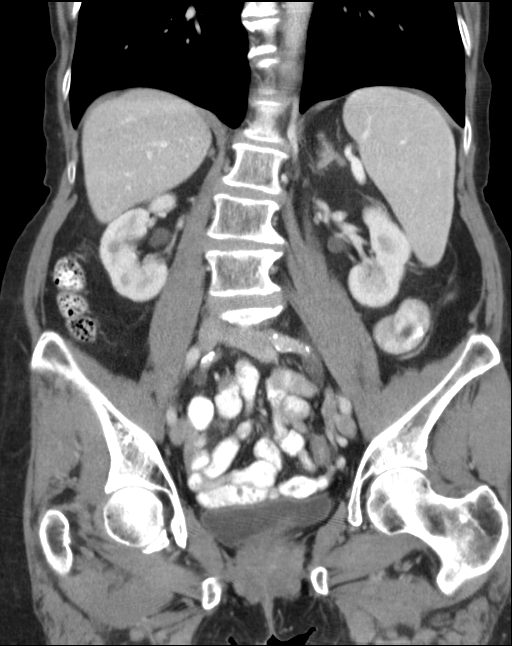

[15 of 46 positions shown; findings below may reference images not displayed]

FINDINGS: Lower chest:  Lung bases are clear.

Hepatobiliary: There is a 7 x 4 mm focus of decreased attenuation in
the posterior segment right lobe of the liver which may represent a
small cyst or hamartoma. There is a stable 1.0 x 0.8 cm focus of
decreased attenuation in the lateral segment of the left lobe of the
liver which appears benign and may represent a small hemangioma.
There is a calcification in the periphery of the left lobe of the
liver, stable, likely a small granuloma. Gallbladder wall is not
appreciably thickened. There is no biliary duct dilatation.

Pancreas: No pancreatic mass or inflammatory focus.

Spleen: No splenic lesions are evident.

Adrenals/Urinary Tract: Adrenals appear normal bilaterally. There is
a 9 x 9 mm cyst in the medial upper pole left kidney. There is a 1 x
1 cm cyst in the mid right kidney. There is no hydronephrosis on
either side. There is no renal or ureteral calculus on either side.
Urinary bladder is midline with wall thickness within normal limits.

Stomach/Bowel: The stomach is diffusely distended with fluid and
food material without appreciable gastric wall thickening. There is
no bowel wall or mesenteric thickening on this study. Several loops
of small bowel are borderline prominent but essentially stable from
the previous study. There is no bowel obstruction. No free air or
portal venous air.

Vascular/Lymphatic: There is atherosclerotic calcification in the
aorta and common iliac arteries without aneurysm. The major
mesenteric vessels appear patent with only mild atherosclerotic
calcification. There is no demonstrable adenopathy in the abdomen or
pelvis.

Reproductive: Uterus is absent. There is no pelvic mass or pelvic
fluid collection.

Other: There is no periappendiceal region inflammation. Appendix
appears within normal limits. There is no abscess or ascites in the
abdomen or pelvis. There is fat in each inguinal ring.

Musculoskeletal: There is lumbar dextroscoliosis. There is
degenerative change in the lumbar spine. There are no blastic or
lytic bone lesions. There is asymmetric disc protrusion on the left
at L4-5 impressing on the exiting nerve root at this level. There is
no abdominal wall or intramuscular lesion.
IMPRESSION: Focal asymmetric disc protrusion on the left at L4-5 impressing on
the L5 nerve root on the left at the exit foramen.

Diffuse gastric dilatation with stomach filled with fluid and air.
Gastric wall not thickened. Question gastric atony or significant
paresis given this degree of gastric distention. Several loops of
borderline dilated small bowel are stable and may represent mild
chronic ileus. There is no bowel wall thickening. No bowel
obstruction.

Appendix appears normal.  No abscess.

No renal or ureteral calculus.  No hydronephrosis.

Uterus absent.

## 2016-09-08 ENCOUNTER — Other Ambulatory Visit: Payer: Self-pay | Admitting: Internal Medicine

## 2016-09-08 DIAGNOSIS — Z1231 Encounter for screening mammogram for malignant neoplasm of breast: Secondary | ICD-10-CM

## 2016-10-09 ENCOUNTER — Ambulatory Visit: Payer: Medicare Other

## 2016-11-12 ENCOUNTER — Ambulatory Visit
Admission: RE | Admit: 2016-11-12 | Discharge: 2016-11-12 | Disposition: A | Discharge status: Home / Self Care | Payer: Medicare Other | Source: Ambulatory Visit | Attending: Internal Medicine | Admitting: Internal Medicine

## 2016-11-12 DIAGNOSIS — Z1231 Encounter for screening mammogram for malignant neoplasm of breast: Secondary | ICD-10-CM

## 2017-02-11 ENCOUNTER — Other Ambulatory Visit: Payer: Self-pay | Admitting: Internal Medicine

## 2017-02-11 DIAGNOSIS — Z720 Tobacco use: Secondary | ICD-10-CM

## 2017-02-16 ENCOUNTER — Ambulatory Visit: Payer: Medicare Other

## 2017-02-17 ENCOUNTER — Other Ambulatory Visit: Payer: Self-pay

## 2017-02-17 ENCOUNTER — Ambulatory Visit (HOSPITAL_COMMUNITY): Payer: Medicare Other | Attending: Cardiology

## 2017-02-17 DIAGNOSIS — I080 Rheumatic disorders of both mitral and aortic valves: Secondary | ICD-10-CM | POA: Diagnosis not present

## 2017-02-17 DIAGNOSIS — I350 Nonrheumatic aortic (valve) stenosis: Secondary | ICD-10-CM

## 2017-02-23 ENCOUNTER — Ambulatory Visit
Admission: RE | Admit: 2017-02-23 | Discharge: 2017-02-23 | Disposition: A | Discharge status: Home / Self Care | Payer: Medicare Other | Source: Ambulatory Visit | Attending: Internal Medicine | Admitting: Internal Medicine

## 2017-02-23 DIAGNOSIS — Z720 Tobacco use: Secondary | ICD-10-CM

## 2017-03-05 ENCOUNTER — Ambulatory Visit (INDEPENDENT_AMBULATORY_CARE_PROVIDER_SITE_OTHER): Payer: Medicare Other | Admitting: Cardiovascular Disease

## 2017-03-05 ENCOUNTER — Encounter: Payer: Self-pay | Admitting: Cardiovascular Disease

## 2017-03-05 VITALS — BP 110/82 | HR 71 | Ht 60.0 in | Wt 115.0 lb

## 2017-03-05 DIAGNOSIS — I7300 Raynaud's syndrome without gangrene: Secondary | ICD-10-CM

## 2017-03-05 DIAGNOSIS — E7800 Pure hypercholesterolemia, unspecified: Secondary | ICD-10-CM | POA: Diagnosis not present

## 2017-03-05 DIAGNOSIS — I700 Atherosclerosis of aorta: Secondary | ICD-10-CM | POA: Diagnosis not present

## 2017-03-05 DIAGNOSIS — Z720 Tobacco use: Secondary | ICD-10-CM | POA: Diagnosis not present

## 2017-03-05 DIAGNOSIS — I350 Nonrheumatic aortic (valve) stenosis: Secondary | ICD-10-CM | POA: Diagnosis not present

## 2017-03-05 DIAGNOSIS — I10 Essential (primary) hypertension: Secondary | ICD-10-CM | POA: Diagnosis not present

## 2017-03-05 NOTE — Progress Notes (Signed)
Patient ID: LYNNZIE OSHIELDS, female   DOB: 22-Jan-1941, 76 y.o.   MRN: [id number]    Cardiology Office Note    Date:  03/05/2017   ID:  PEONY LAVORGNA, DOB 03/25/1941, MRN [id number]  PCP:  Minda Meo, MD  Cardiologist:   Thurmon Fair, MD   Chief Complaint  Patient presents with  . Follow-up  Aortic stenosis, hyperlipidemia  History of Present Illness:  TIAHNA SHIERS is a 76 y.o. female with mild aortic stenosis, mild carotid atherosclerosis, hypertension and hyperlipidemia who returns for routine follow-up.   She has not had any cardiovascular problems since her last appointment. The patient specifically denies any chest pain at rest exertion, dyspnea at rest or with exertion, orthopnea, paroxysmal nocturnal dyspnea, syncope, palpitations, focal neurological deficits, intermittent claudication, lower extremity edema, unexplained weight gain, cough, hemoptysis or wheezing. Has occasional digi discoloration consistent with Raynaud's syndrome, but no pain, paresthesia or ulceration.  She quit smoking earlier this year but relapsed after only one month. She smokes about a half a pack of cigarettes a day.  Her last lipid profile showed her LDL was still excessively high. Switching to rosuvastatin in the near future.  Past Medical History:  Diagnosis Date  . Cancer [facility])    Bladder cancer  . Elevated cholesterol   . GERD (gastroesophageal reflux disease)   . Heart murmur   . Heart murmur   . Hypertension   . Hypothyroidism   . Osteoporosis   . Uterine prolapse     Past Surgical History:  Procedure Laterality Date  . BLADDER TUMOR EXCISION    . CAROTID DOPPLER  2013  . COLONOSCOPY WITH PROPOFOL N/A 02/17/2016   Procedure: COLONOSCOPY WITH PROPOFOL;  Surgeon: Charolett Bumpers, MD;  Location: [facility];  Service: Endoscopy;  Laterality: N/A;  . DOPPLER ECHOCARDIOGRAPHY  2013  . ESOPHAGOGASTRODUODENOSCOPY (EGD) WITH PROPOFOL N/A 02/17/2016   Procedure:  ESOPHAGOGASTRODUODENOSCOPY (EGD) WITH PROPOFOL;  Surgeon: Charolett Bumpers, MD;  Location: [facility];  Service: Endoscopy;  Laterality: N/A;  . VAGINAL HYSTERECTOMY      Current Medications: Outpatient Medications Prior to Visit  Medication Sig Dispense Refill  . ALPRAZolam (XANAX) 0.5 MG tablet Take 0.5 mg by mouth at bedtime.     . Cholecalciferol (VITAMIN D-3) 1000 UNITS CAPS Take 1 capsule by mouth daily. Reported on 12/26/2015    . diphenhydramine-acetaminophen (TYLENOL PM) 25-500 MG TABS Take 1 tablet by mouth at bedtime. Reported on 12/26/2015    . levothyroxine (SYNTHROID, LEVOTHROID) 50 MCG tablet Take 50 mcg by mouth daily before breakfast. Reported on 12/26/2015    . triamterene-hydrochlorothiazide (MAXZIDE-25) 37.5-25 MG tablet Take 1 tablet by mouth every other day.   8  . aspirin 81 MG tablet Take 81 mg by mouth daily. Reported on 12/26/2015    . lovastatin (MEVACOR) 40 MG tablet Take 40 mg by mouth at bedtime.    . pantoprazole (PROTONIX) 40 MG tablet Take 40 mg by mouth daily.     No facility-administered medications prior to visit.      Allergies:   Patient has no known allergies.   Social History   Social History  . Marital status: Divorced    Spouse name: N/A  . Number of children: N/A  . Years of education: N/A   Social History Main Topics  . Smoking status: Current Every Day Smoker    Packs/day: 0.50    Years: 50.00    Types: Cigarettes  . Smokeless tobacco: Not on file  .  Alcohol use 4.2 oz/week    7 Standard drinks or equivalent per week  . Drug use: No  . Sexual activity: No   Other Topics Concern  . Not on file   Social History Narrative  . No narrative on file     Family History:  The patient's  family history includes Breast cancer in her maternal aunt; Cancer in her mother; Diabetes in her maternal grandmother; Heart disease in her father; Hypertension in her mother.   ROS:   Please see the history of present illness.    ROS All other  systems reviewed and are negative.   PHYSICAL EXAM:   VS:  BP 110/82 (BP Location: Right Arm, Patient Position: Sitting, Cuff Size: Normal)   Pulse 71   Ht 5' (1.524 m)   Wt 52.2 kg (115 lb)   BMI 22.46 kg/m    GEN: Well nourished, well developed, in no acute distress  HEENT: normal  Neck: no JVD, bilateral carotid bruits L>R, no masses Cardiac: RRR; early peaking 2-3/6 aortic ejection murmur, no diastolic murmurs, rubs, or gallops,no edema  Respiratory:  clear to auscultation bilaterally, normal work of breathing GI: soft, nontender, nondistended, + BS MS: no deformity or atrophy  Skin: warm and dry, no rash Neuro:  Alert and Oriented x 3, Strength and sensation are intact Psych: euthymic mood, full affect  Wt Readings from Last 3 Encounters:  03/05/17 52.2 kg (115 lb)  03/06/16 50.5 kg (111 lb 6.4 oz)  02/17/16 48.5 kg (107 lb)      Studies/Labs Reviewed:   EKG:  EKG is ordered today.  The ekg ordered today demonstrates Sinus rhythm, Nonspecific lateral T-wave flattening  Recent Labs: March 28, [facility] Normal LFTs, creatinine 1.2, potassium 4.0, TSH 2.2 Total cholesterol 217, triglycerides 75, HDL 57, LDL 145  ASSESSMENT:    1. Nonrheumatic aortic valve stenosis   2. Essential hypertension   3. Hypercholesteremia   4. Raynaud's phenomenon without gangrene   5. Tobacco abuse      PLAN:  In order of problems listed above:  1. AS: There has been slight progression in the severity of the valvular abnormality, but she still has mild aortic stenosis by echo and exam. She is asymptomatic.  2. HTN: Well-controlled blood pressure 3. HLP: LDL cholesterol ideally should be less than 100 mg/deciliter, having said that her current LDL levels are substantially improved from baseline. 4. Aortic atherosclerosis: She already has mild plaque in the carotid arteries, aorta, iliac arteries, abdominal visceral arteries and probably has coronary plaque as well although none  of these are symptomatic. 5. Raynaud's syndrome: She finds this not visually pleasing, but does not have other symptoms. Consider switching to a calcium channel blocker if necessary. 6. Smoking cessation: Strongly reinforced need for permanent smoking cessation.  Medication Adjustments/Labs and Tests Ordered: Current medicines are reviewed at length with the patient today.  Concerns regarding medicines are outlined above.  Medication changes, Labs and Tests ordered today are listed in the Patient Instructions below. Patient Instructions  NO CHANGE WITH TREATMENT OR MEDICATIONS    Your physician wants you to follow-up in 12 MONTHS WITH DR Baer Hinton. You will receive a reminder letter in the mail two months in advance. If you don't receive a letter, please call our office to schedule the follow-up appointment.    If you need a refill on your cardiac medications before your next appointment, please call your pharmacy.     Signed, Thurmon Fair, MD  03/05/2017  10:18 AM    [facility] [address] [place], [place], [place]  [address] Phone: [phone]; Fax: [phone]

## 2017-03-05 NOTE — Patient Instructions (Signed)
NO CHANGE WITH TREATMENT OR MEDICATIONS    Your physician wants you to follow-up in 16 MONTHS WITH DR CROITORU. You will receive a reminder letter in the mail two months in advance. If you don't receive a letter, please call our office to schedule the follow-up appointment.    If you need a refill on your cardiac medications before your next appointment, please call your pharmacy.

## 2017-12-14 ENCOUNTER — Other Ambulatory Visit: Payer: Self-pay | Admitting: Internal Medicine

## 2017-12-14 DIAGNOSIS — Z139 Encounter for screening, unspecified: Secondary | ICD-10-CM

## 2018-01-04 ENCOUNTER — Ambulatory Visit
Admission: RE | Admit: 2018-01-04 | Discharge: 2018-01-04 | Disposition: A | Discharge status: Home / Self Care | Payer: Medicare Other | Source: Ambulatory Visit | Attending: Internal Medicine | Admitting: Internal Medicine

## 2018-01-04 DIAGNOSIS — Z139 Encounter for screening, unspecified: Secondary | ICD-10-CM

## 2018-01-06 ENCOUNTER — Telehealth: Payer: Self-pay | Admitting: Cardiovascular Disease

## 2018-01-06 NOTE — Telephone Encounter (Signed)
Does not sound cardiac. Any fever or chills or cough or leg swelling with it? If the answer is yes to any of those, she should have a chest x ray. MCr

## 2018-01-06 NOTE — Telephone Encounter (Signed)
Spoke with pt, she denies any other symptoms. She will follow up as scheduled.

## 2018-01-06 NOTE — Telephone Encounter (Signed)
New Message  Pt c/o of Chest Pain: STAT if CP now or developed within 24 hours  1. Are you having CP right now? no  2. Are you experiencing any other symptoms (ex. SOB, nausea, vomiting, sweating)? no  3. How long have you been experiencing CP? Since Sunday 2/24 , she's been having pains under her rib  4. Is your CP continuous or coming and going? Coming and going  5. Have you taken Nitroglycerin? no ?

## 2018-01-06 NOTE — Telephone Encounter (Signed)
Spoke with pt, since Sunday she is having a sharp, stabbing pain in the left chest under the ribs. It usually lasts only seconds but today it lasted about 1 minute. She denies any radiation, SOB or other symptoms with the pain. She reports it usually occurs at rest and it woke her up last night. She has never had this type of pain before and when she has the pain she is unable to take a deep breath. She has a follow up appt 02-25-18 with dr croitoru. Reassurance given to the patient, will forward to dr croitoru to review and advise.

## 2018-02-15 ENCOUNTER — Other Ambulatory Visit: Payer: Self-pay | Admitting: Internal Medicine

## 2018-02-15 DIAGNOSIS — Z720 Tobacco use: Secondary | ICD-10-CM

## 2018-02-15 DIAGNOSIS — J449 Chronic obstructive pulmonary disease, unspecified: Secondary | ICD-10-CM

## 2018-02-25 ENCOUNTER — Ambulatory Visit: Payer: Medicare Other | Admitting: Cardiovascular Disease

## 2018-02-25 ENCOUNTER — Encounter: Payer: Self-pay | Admitting: Cardiovascular Disease

## 2018-02-25 VITALS — BP 129/79 | HR 75 | Ht 60.0 in | Wt 109.8 lb

## 2018-02-25 DIAGNOSIS — E7800 Pure hypercholesterolemia, unspecified: Secondary | ICD-10-CM

## 2018-02-25 DIAGNOSIS — I350 Nonrheumatic aortic (valve) stenosis: Secondary | ICD-10-CM | POA: Diagnosis not present

## 2018-02-25 DIAGNOSIS — Z720 Tobacco use: Secondary | ICD-10-CM | POA: Diagnosis not present

## 2018-02-25 DIAGNOSIS — I700 Atherosclerosis of aorta: Secondary | ICD-10-CM | POA: Diagnosis not present

## 2018-02-25 DIAGNOSIS — I7300 Raynaud's syndrome without gangrene: Secondary | ICD-10-CM | POA: Insufficient documentation

## 2018-02-25 DIAGNOSIS — I10 Essential (primary) hypertension: Secondary | ICD-10-CM | POA: Diagnosis not present

## 2018-02-25 NOTE — Progress Notes (Signed)
Patient ID: Gina Harrell, female   DOB: 1941/03/13, 77 y.o.   MRN: [id number]    Cardiology Office Note    Date:  02/25/2018   ID:  Gina Harrell, DOB January 23, 1941, MRN [id number]  PCP:  [name], MD  Cardiologist:   Thurmon Fair, MD   Chief Complaint  Patient presents with  . Follow-up    Aortic stenosis, hypercholesterolemia  Aortic stenosis, hyperlipidemia  History of Present Illness:  Gina Harrell is a 77 y.o. female with mild aortic stenosis, mild carotid atherosclerosis, hypertension and hyperlipidemia who returns for routine follow-up.   She was having some problems with chest pain, but the pain was musculoskeletal.  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  The patient specifically denies any chest pain at rest exertion, dyspnea at rest or with exertion, orthopnea, paroxysmal nocturnal dyspnea, syncope, palpitations, focal neurological deficits, intermittent claudication, lower extremity edema, unexplained weight gain, cough, hemoptysis or wheezing. Unfortunately she continues to smoke.  After switching to rosuvastatin but lipid profile looks great.  Labs performed on February 07, 2018 showed total cholesterol 153, triglycerides 128, HDL 59, LDL 68.  Other labs are also favorable including a hemoglobin of 15.5, potassium 3.7 and creatinine 1.5 (normal range she reported by the lab at 0.3-1.5), BUN 20, glucose 99, normal liver function tests  Past Medical History:  Diagnosis Date  . Cancer [facility])    Bladder cancer  . Elevated cholesterol   . GERD (gastroesophageal reflux disease)   . Heart murmur   . Heart murmur   . Hypertension   . Hypothyroidism   . Osteoporosis   . Uterine prolapse     Past Surgical History:  Procedure Laterality Date  . BLADDER TUMOR EXCISION    . BREAST EXCISIONAL BIOPSY Right   . CAROTID DOPPLER  2013  . COLONOSCOPY WITH PROPOFOL N/A 02/17/2016     Procedure: COLONOSCOPY WITH PROPOFOL;  Surgeon: Charolett Bumpers, MD;  Location: [facility] ENDOSCOPY;  Service: Endoscopy;  Laterality: N/A;  . DOPPLER ECHOCARDIOGRAPHY  2013  . ESOPHAGOGASTRODUODENOSCOPY (EGD) WITH PROPOFOL N/A 02/17/2016   Procedure: ESOPHAGOGASTRODUODENOSCOPY (EGD) WITH PROPOFOL;  Surgeon: Charolett Bumpers, MD;  Location: [facility] ENDOSCOPY;  Service: Endoscopy;  Laterality: N/A;  . VAGINAL HYSTERECTOMY      Current Medications: Outpatient Medications Prior to Visit  Medication Sig Dispense Refill  . ALPRAZolam (XANAX) 0.5 MG tablet Take 0.5 mg by mouth at bedtime.     . Cholecalciferol (VITAMIN D-3) 1000 UNITS CAPS Take 1 capsule by mouth daily. Reported on 12/26/2015    . diphenhydramine-acetaminophen (TYLENOL PM) 25-500 MG TABS Take 1 tablet by mouth at bedtime. Reported on 12/26/2015    . levothyroxine (SYNTHROID, LEVOTHROID) 50 MCG tablet Take 50 mcg by mouth daily before breakfast. Reported on 12/26/2015    . rosuvastatin (CRESTOR) 20 MG tablet Take 20 mg by mouth daily.    Marland Kitchen triamterene-hydrochlorothiazide (MAXZIDE-25) 37.5-25 MG tablet Take 1 tablet by mouth every other day.   8  . aspirin EC 81 MG tablet Take 81 mg by mouth daily.     No facility-administered medications prior to visit.      Allergies:   Patient has no known allergies.   Social History   Socioeconomic History  . Marital status: Divorced    Spouse name: Not on file  . Number of children: Not on file  . Years of education: Not on file  . Highest education  level: Not on file  Occupational History  . Not on file  Social Needs  . Financial resource strain: Not on file  . Food insecurity:    Worry: Not on file    Inability: Not on file  . Transportation needs:    Medical: Not on file    Non-medical: Not on file  Tobacco Use  . Smoking status: Current Every Day Smoker    Packs/day: 0.50    Years: 50.00    Pack years: 25.00    Types: Cigarettes  . Smokeless tobacco: Never Used  Substance and  Sexual Activity  . Alcohol use: Yes    Alcohol/week: 4.2 oz    Types: 7 Standard drinks or equivalent per week  . Drug use: No  . Sexual activity: Never    Birth control/protection: Surgical  Lifestyle  . Physical activity:    Days per week: Not on file    Minutes per session: Not on file  . Stress: Not on file  Relationships  . Social connections:    Talks on phone: Not on file    Gets together: Not on file    Attends religious service: Not on file    Active member of club or organization: Not on file    Attends meetings of clubs or organizations: Not on file    Relationship status: Not on file  Other Topics Concern  . Not on file  Social History Narrative  . Not on file     Family History:  The patient's  family history includes Breast cancer in her maternal aunt; Cancer in her mother; Diabetes in her maternal grandmother; Heart disease in her father; Hypertension in her mother.   ROS:   Please see the history of present illness.    ROS All other systems reviewed and are negative.   PHYSICAL EXAM:   VS:  BP 129/79   Pulse 75   Ht 5' (1.524 m)   Wt 109 lb 12.8 oz (49.8 kg)   BMI 21.44 kg/m     General: Alert, oriented x3, no distress, very lean Head: no evidence of trauma, PERRL, EOMI, no exophtalmos or lid lag, no myxedema, no xanthelasma; normal ears, nose and oropharynx Neck: normal jugular venous pulsations and no hepatojugular reflux; brisk carotid pulses without delay, but with prominent bilateral carotid bruits Chest: clear to auscultation, no signs of consolidation by percussion or palpation, normal fremitus, symmetrical and full respiratory excursions Cardiovascular: normal position and quality of the apical impulse, regular rhythm, normal first and second heart sounds, 3/6 early peaking aortic ejection murmur, no diastolic murmurs, rubs or gallops Abdomen: no tenderness or distention, no masses by palpation, no abnormal pulsatility or arterial bruits, normal  bowel sounds, no hepatosplenomegaly Extremities: no clubbing, cyanosis or edema; 2+ radial, ulnar and brachial pulses bilaterally; 2+ right femoral, posterior tibial and dorsalis pedis pulses; 2+ left femoral, posterior tibial and dorsalis pedis pulses; no subclavian or femoral bruits Neurological: grossly nonfocal Psych: Normal mood and affect    Wt Readings from Last 3 Encounters:  02/25/18 109 lb 12.8 oz (49.8 kg)  03/05/17 115 lb (52.2 kg)  03/06/16 111 lb 6.4 oz (50.5 kg)      Studies/Labs Reviewed:   EKG:  EKG is ordered today.  The ekg ordered today demonstrates normal sinus rhythm, pressure in the left atrial abnormality, no position changes, QTC 433 ms  Recent Labs: March 28, [facility] Normal LFTs, creatinine 1.2, potassium 4.0, TSH 2.2 Total cholesterol 217, triglycerides 75,  HDL 57, LDL 145  ASSESSMENT:    1. Nonrheumatic aortic valve stenosis   2. Essential hypertension   3. Hypercholesteremia   4. Aortic atherosclerosis (HCC)   5. Raynaud's phenomenon without gangrene   6. Tobacco abuse      PLAN:  In order of problems listed above:  1. AS: She remains asymptomatic.  Asked [place] to call us if she develops exertional angina, exertional dyspnea or exertional syncope. 2. HTN: Well-controlled 3. HLP: All lipid parameters in target range 4. Aortic atherosclerosis: She already has mild plaque in the carotid arteries, aorta, iliac arteries, abdominal visceral arteries and probably has coronary plaque as well.  None of these are causing symptoms and the focus is on risk factor management 5. Raynaud's syndrome: She did not mention this as bothering her today 6. Smoking cessation: She is a little ashamed that she continues smoking, but was also not willing to commit to a plan for smoking cessation.  We will keep gently pushing in that direction.  Medication Adjustments/Labs and Tests Ordered: Current medicines are reviewed at length with the patient today.   Concerns regarding medicines are outlined above.  Medication changes, Labs and Tests ordered today are listed in the Patient Instructions below. Patient Instructions  Dr Royann Shivers has recommended making the following medication changes: 1. STOP Aspirin  Your physician recommends that you schedule a follow-up appointment in 12 months. You will receive a reminder letter in the mail two months in advance. If you don't receive a letter, please call our office to schedule the follow-up appointment.  If you need a refill on your cardiac medications before your next appointment, please call your pharmacy.    Signed, Thurmon Fair, MD  02/25/2018 7:06 PM    [facility] [address] [place], [place], [place]  [address] Phone: [phone]; Fax: [phone]

## 2018-02-25 NOTE — Patient Instructions (Signed)
Dr Croitoru has recommended making the following medication changes: 1. STOP Aspirin  Your physician recommends that you schedule a follow-up appointment in 12 months. You will receive a reminder letter in the mail two months in advance. If you don't receive a letter, please call our office to schedule the follow-up appointment.  If you need a refill on your cardiac medications before your next appointment, please call your pharmacy. 

## 2018-03-07 ENCOUNTER — Ambulatory Visit
Admission: RE | Admit: 2018-03-07 | Discharge: 2018-03-07 | Disposition: A | Discharge status: Home / Self Care | Payer: Medicare Other | Source: Ambulatory Visit | Attending: Internal Medicine | Admitting: Internal Medicine

## 2018-03-07 ENCOUNTER — Ambulatory Visit: Payer: Medicare Other

## 2018-03-07 DIAGNOSIS — J449 Chronic obstructive pulmonary disease, unspecified: Secondary | ICD-10-CM

## 2018-03-07 DIAGNOSIS — Z720 Tobacco use: Secondary | ICD-10-CM

## 2018-04-29 ENCOUNTER — Other Ambulatory Visit (HOSPITAL_COMMUNITY): Payer: Self-pay | Admitting: *Deleted

## 2018-05-02 ENCOUNTER — Ambulatory Visit (HOSPITAL_COMMUNITY)
Admission: RE | Admit: 2018-05-02 | Discharge: 2018-05-02 | Disposition: A | Discharge status: Home / Self Care | Payer: Medicare Other | Source: Ambulatory Visit | Attending: Internal Medicine | Admitting: Internal Medicine

## 2018-05-02 DIAGNOSIS — M810 Age-related osteoporosis without current pathological fracture: Secondary | ICD-10-CM | POA: Diagnosis present

## 2018-05-02 MED ORDER — DENOSUMAB 60 MG/ML ~~LOC~~ SOSY
60.0000 mg | PREFILLED_SYRINGE | Freq: Once | SUBCUTANEOUS | Status: AC
  Administered 2018-05-02: 60 mg via SUBCUTANEOUS
  Filled 2018-05-02: qty 1

## 2018-05-02 NOTE — Discharge Instructions (Signed)
Denosumab injection °What is this medicine? °DENOSUMAB (den oh sue mab) slows bone breakdown. Prolia is used to treat osteoporosis in women after menopause and in men. Xgeva is used to treat a high calcium level due to cancer and to prevent bone fractures and other bone problems caused by multiple myeloma or cancer bone metastases. Xgeva is also used to treat giant cell tumor of the bone. °This medicine may be used for other purposes; ask your health care provider or pharmacist if you have questions. °COMMON BRAND NAME(S): Prolia, XGEVA °What should I tell my health care provider before I take this medicine? °They need to know if you have any of these conditions: °-dental disease °-having surgery or tooth extraction °-infection °-kidney disease °-low levels of calcium or Vitamin D in the blood °-malnutrition °-on hemodialysis °-skin conditions or sensitivity °-thyroid or parathyroid disease °-an unusual reaction to denosumab, other medicines, foods, dyes, or preservatives °-pregnant or trying to get pregnant °-breast-feeding °How should I use this medicine? °This medicine is for injection under the skin. It is given by a health care professional in a hospital or clinic setting. °If you are getting Prolia, a special MedGuide will be given to you by the pharmacist with each prescription and refill. Be sure to read this information carefully each time. °For Prolia, talk to your pediatrician regarding the use of this medicine in children. Special care may be needed. For Xgeva, talk to your pediatrician regarding the use of this medicine in children. While this drug may be prescribed for children as young as 13 years for selected conditions, precautions do apply. °Overdosage: If you think you have taken too much of this medicine contact a poison control center or emergency room at once. °NOTE: This medicine is only for you. Do not share this medicine with others. °What if I miss a dose? °It is important not to miss your  dose. Call your doctor or health care professional if you are unable to keep an appointment. °What may interact with this medicine? °Do not take this medicine with any of the following medications: °-other medicines containing denosumab °This medicine may also interact with the following medications: °-medicines that lower your chance of fighting infection °-steroid medicines like prednisone or cortisone °This list may not describe all possible interactions. Give your health care provider a list of all the medicines, herbs, non-prescription drugs, or dietary supplements you use. Also tell them if you smoke, drink alcohol, or use illegal drugs. Some items may interact with your medicine. °What should I watch for while using this medicine? °Visit your doctor or health care professional for regular checks on your progress. Your doctor or health care professional may order blood tests and other tests to see how you are doing. °Call your doctor or health care professional for advice if you get a fever, chills or sore throat, or other symptoms of a cold or flu. Do not treat yourself. This drug may decrease your body's ability to fight infection. Try to avoid being around people who are sick. °You should make sure you get enough calcium and vitamin D while you are taking this medicine, unless your doctor tells you not to. Discuss the foods you eat and the vitamins you take with your health care professional. °See your dentist regularly. Brush and floss your teeth as directed. Before you have any dental work done, tell your dentist you are receiving this medicine. °Do not become pregnant while taking this medicine or for 5 months after stopping   it. Talk with your doctor or health care professional about your birth control options while taking this medicine. Women should inform their doctor if they wish to become pregnant or think they might be pregnant. There is a potential for serious side effects to an unborn child. Talk  to your health care professional or pharmacist for more information. What side effects may I notice from receiving this medicine? Side effects that you should report to your doctor or health care professional as soon as possible: -allergic reactions like skin rash, itching or hives, swelling of the face, lips, or tongue -bone pain -breathing problems -dizziness -jaw pain, especially after dental work -redness, blistering, peeling of the skin -signs and symptoms of infection like fever or chills; cough; sore throat; pain or trouble passing urine -signs of low calcium like fast heartbeat, muscle cramps or muscle pain; pain, tingling, numbness in the hands or feet; seizures -unusual bleeding or bruising -unusually weak or tired Side effects that usually do not require medical attention (report to your doctor or health care professional if they continue or are bothersome): -constipation -diarrhea -headache -joint pain -loss of appetite -muscle pain -runny nose -tiredness -upset stomach This list may not describe all possible side effects. Call your doctor for medical advice about side effects. You may report side effects to FDA at 1-800-FDA-1088. Where should I keep my medicine? This medicine is only given in a clinic, doctor's office, or other health care setting and will not be stored at home. NOTE: This sheet is a summary. It may not cover all possible information. If you have questions about this medicine, talk to your doctor, pharmacist, or health care provider.  2018 [employer] Standard (2016-11-17 19:17:21)

## 2018-09-09 ENCOUNTER — Other Ambulatory Visit: Payer: Self-pay | Admitting: Internal Medicine

## 2018-09-09 DIAGNOSIS — R1031 Right lower quadrant pain: Secondary | ICD-10-CM

## 2018-09-14 ENCOUNTER — Ambulatory Visit
Admission: RE | Admit: 2018-09-14 | Discharge: 2018-09-14 | Disposition: A | Discharge status: Home / Self Care | Payer: Medicare Other | Source: Ambulatory Visit | Attending: Internal Medicine | Admitting: Internal Medicine

## 2018-09-14 DIAGNOSIS — R1031 Right lower quadrant pain: Secondary | ICD-10-CM

## 2018-09-14 MED ORDER — IOPAMIDOL (ISOVUE-300) INJECTION 61%
100.0000 mL | Freq: Once | INTRAVENOUS | Status: AC | PRN
  Administered 2018-09-14: 80 mL via INTRAVENOUS

## 2019-01-03 ENCOUNTER — Other Ambulatory Visit: Payer: Self-pay | Admitting: Internal Medicine

## 2019-01-03 DIAGNOSIS — Z1231 Encounter for screening mammogram for malignant neoplasm of breast: Secondary | ICD-10-CM

## 2019-01-31 ENCOUNTER — Ambulatory Visit: Payer: Medicare Other

## 2019-02-22 ENCOUNTER — Telehealth: Payer: Self-pay

## 2019-02-22 NOTE — Telephone Encounter (Signed)
Left message to call back  

## 2019-02-27 ENCOUNTER — Ambulatory Visit: Payer: Medicare Other | Admitting: Cardiovascular Disease

## 2019-03-06 ENCOUNTER — Ambulatory Visit: Payer: Medicare Other

## 2019-04-25 ENCOUNTER — Ambulatory Visit
Admission: RE | Admit: 2019-04-25 | Discharge: 2019-04-25 | Disposition: A | Discharge status: Home / Self Care | Payer: Medicare Other | Source: Ambulatory Visit | Attending: Internal Medicine | Admitting: Internal Medicine

## 2019-04-25 ENCOUNTER — Other Ambulatory Visit: Payer: Self-pay

## 2019-04-25 DIAGNOSIS — Z1231 Encounter for screening mammogram for malignant neoplasm of breast: Secondary | ICD-10-CM

## 2019-06-05 ENCOUNTER — Other Ambulatory Visit: Payer: Self-pay | Admitting: Sports Medicine

## 2019-06-05 DIAGNOSIS — M545 Low back pain, unspecified: Secondary | ICD-10-CM

## 2019-06-16 ENCOUNTER — Ambulatory Visit
Admission: RE | Admit: 2019-06-16 | Discharge: 2019-06-16 | Disposition: A | Discharge status: Home / Self Care | Payer: Medicare Other | Source: Ambulatory Visit | Attending: Sports Medicine | Admitting: Sports Medicine

## 2019-06-16 DIAGNOSIS — M545 Low back pain, unspecified: Secondary | ICD-10-CM

## 2019-07-05 ENCOUNTER — Ambulatory Visit (INDEPENDENT_AMBULATORY_CARE_PROVIDER_SITE_OTHER): Payer: Medicare Other | Admitting: Cardiovascular Disease

## 2019-07-05 ENCOUNTER — Other Ambulatory Visit: Payer: Self-pay

## 2019-07-05 ENCOUNTER — Encounter: Payer: Self-pay | Admitting: Cardiovascular Disease

## 2019-07-05 VITALS — BP 132/88 | HR 60 | Temp 97.9°F | Ht 60.0 in | Wt 109.0 lb

## 2019-07-05 DIAGNOSIS — E7800 Pure hypercholesterolemia, unspecified: Secondary | ICD-10-CM | POA: Diagnosis not present

## 2019-07-05 DIAGNOSIS — I350 Nonrheumatic aortic (valve) stenosis: Secondary | ICD-10-CM

## 2019-07-05 DIAGNOSIS — I10 Essential (primary) hypertension: Secondary | ICD-10-CM | POA: Diagnosis not present

## 2019-07-05 DIAGNOSIS — I700 Atherosclerosis of aorta: Secondary | ICD-10-CM | POA: Diagnosis not present

## 2019-07-05 DIAGNOSIS — F17200 Nicotine dependence, unspecified, uncomplicated: Secondary | ICD-10-CM

## 2019-07-05 NOTE — Progress Notes (Signed)
Patient ID: Gina Harrell, female   DOB: [birth year], 78 y.o.   MRN: [id number]    Cardiology Office Note    Date:  07/07/2019   ID:  Gina Harrell, DOB 1941/02/04, MRN [id number]  PCP:  [name], MD  Cardiologist:   Thurmon Fair, MD   Chief Complaint  Patient presents with  . Aortic Stenosis  . Hyperlipidemia    History of Present Illness:  Gina Harrell is a 78 y.o. female with aortic stenosis, mild carotid atherosclerosis, hypertension and hyperlipidemia who returns for routine follow-up.   She is generally doing well.  She has what she calls "little pains" in the precordial area that are very brief.  She had some symptoms of acid reflux that improved within a week of starting pantoprazole.  Intermittently she has discomfort in the right upper quadrant and epigastrium in the muscles in that area become very tight and "hard as a rock" after the symptoms resolve the area to remain sore to touch for a few days.  She has mild shortness of breath when she smokes and is trying to reduce cigarette smoking.  She is down to 6-8 cigarettes a day.  She does not report shortness of breath with physical activity.  Recent labs showed a satisfactory lipid profile and normal routine labs.  The patient specifically denies any chest pain with exertion, orthopnea, paroxysmal nocturnal dyspnea, syncope, palpitations, focal neurological deficits, intermittent claudication, lower extremity edema, unexplained weight gain, cough, hemoptysis or wheezing.  Recent labs include glucose 98, creatinine 1.2, potassium 3.8, hemoglobin 14.9, normal liver function tests, cholesterol 144, triglycerides 85, LDL 75, HDL 52  Past Medical History:  Diagnosis Date  . Cancer [facility])    Bladder cancer  . Elevated cholesterol   . GERD (gastroesophageal reflux disease)   . Heart murmur   . Heart murmur   . Hypertension   . Hypothyroidism   . Osteoporosis   . Uterine prolapse     Past Surgical  History:  Procedure Laterality Date  . BLADDER TUMOR EXCISION    . BREAST EXCISIONAL BIOPSY Right   . CAROTID DOPPLER  2013  . COLONOSCOPY WITH PROPOFOL N/A 02/17/2016   Procedure: COLONOSCOPY WITH PROPOFOL;  Surgeon: Charolett Bumpers, MD;  Location: [facility] ENDOSCOPY;  Service: Endoscopy;  Laterality: N/A;  . DOPPLER ECHOCARDIOGRAPHY  2013  . ESOPHAGOGASTRODUODENOSCOPY (EGD) WITH PROPOFOL N/A 02/17/2016   Procedure: ESOPHAGOGASTRODUODENOSCOPY (EGD) WITH PROPOFOL;  Surgeon: Charolett Bumpers, MD;  Location: [facility] ENDOSCOPY;  Service: Endoscopy;  Laterality: N/A;  . VAGINAL HYSTERECTOMY      Current Medications: Outpatient Medications Prior to Visit  Medication Sig Dispense Refill  . ALPRAZolam (XANAX) 0.5 MG tablet Take 0.5 mg by mouth at bedtime.     . Cholecalciferol (VITAMIN D-3) 1000 UNITS CAPS Take 1 capsule by mouth daily. Reported on 12/26/2015    . diphenhydramine-acetaminophen (TYLENOL PM) 25-500 MG TABS Take 1 tablet by mouth at bedtime. Reported on 12/26/2015    . levothyroxine (SYNTHROID, LEVOTHROID) 50 MCG tablet Take 50 mcg by mouth daily before breakfast. Reported on 12/26/2015    . rosuvastatin (CRESTOR) 20 MG tablet Take 20 mg by mouth daily.    Marland Kitchen triamterene-hydrochlorothiazide (MAXZIDE-25) 37.5-25 MG tablet Take 1 tablet by mouth every other day.   8   No facility-administered medications prior to visit.      Allergies:   Patient has no known allergies.   Social History   Socioeconomic History  . Marital status: Divorced  Spouse name: Not on file  . Number of children: Not on file  . Years of education: Not on file  . Highest education level: Not on file  Occupational History  . Not on file  Social Needs  . Financial resource strain: Not on file  . Food insecurity    Worry: Not on file    Inability: Not on file  . Transportation needs    Medical: Not on file    Non-medical: Not on file  Tobacco Use  . Smoking status: Current Every Day Smoker    Packs/day: 0.50     Years: 50.00    Pack years: 25.00    Types: Cigarettes  . Smokeless tobacco: Never Used  Substance and Sexual Activity  . Alcohol use: Yes    Alcohol/week: 7.0 standard drinks    Types: 7 Standard drinks or equivalent per week  . Drug use: No  . Sexual activity: Never    Birth control/protection: Surgical  Lifestyle  . Physical activity    Days per week: Not on file    Minutes per session: Not on file  . Stress: Not on file  Relationships  . Social Musician on phone: Not on file    Gets together: Not on file    Attends religious service: Not on file    Active member of club or organization: Not on file    Attends meetings of clubs or organizations: Not on file    Relationship status: Not on file  Other Topics Concern  . Not on file  Social History Narrative  . Not on file     Family History:  The patient's  family history includes Breast cancer in her maternal aunt; Cancer in her mother; Diabetes in her maternal grandmother; Heart disease in her father; Hypertension in her mother.   ROS:   Please see the history of present illness.    All other systems are reviewed and are negative  PHYSICAL EXAM:   VS:  BP 132/88   Pulse 60   Temp 97.9 F (36.6 C)   Ht 5' (1.524 m)   Wt 109 lb (49.4 kg)   SpO2 98%   BMI 21.29 kg/m      General: Alert, oriented x3, no distress, lean, mild scoliosis Head: no evidence of trauma, PERRL, EOMI, no exophtalmos or lid lag, no myxedema, no xanthelasma; normal ears, nose and oropharynx Neck: normal jugular venous pulsations and no hepatojugular reflux; brisk carotid pulses without delay and no carotid bruits Chest: clear to auscultation, no signs of consolidation by percussion or palpation, normal fremitus, symmetrical and full respiratory excursions Cardiovascular: normal position and quality of the apical impulse, regular rhythm, normal first and second heart sounds, early peaking 3/6 aortic ejection murmur heard broadly  throughout the precordium no diastolic murmurs, rubs or gallops Abdomen: no tenderness or distention, no masses by palpation, no abnormal pulsatility or arterial bruits, normal bowel sounds, no hepatosplenomegaly Extremities: no clubbing, cyanosis or edema; 2+ radial, ulnar and brachial pulses bilaterally; 2+ right femoral, posterior tibial and dorsalis pedis pulses; 2+ left femoral, posterior tibial and dorsalis pedis pulses; no subclavian or femoral bruits Neurological: grossly nonfocal Psych: Normal mood and affect    Wt Readings from Last 3 Encounters:  07/05/19 109 lb (49.4 kg)  02/25/18 109 lb 12.8 oz (49.8 kg)  03/05/17 115 lb (52.2 kg)      Studies/Labs Reviewed:   EKG:  EKG is ordered today.  The ekg ordered  today demonstrates normal sinus rhythm, pressure in the left atrial abnormality, no position changes, QTC 433 ms  Recent Labs: glucose 98, creatinine 1.2, potassium 3.8, hemoglobin 14.9, normal liver function tests cholesterol 144, triglycerides 85, LDL 75, HDL 52  ASSESSMENT:    1. Aortic valve stenosis, nonrheumatic   2. Essential hypertension   3. Pure hypercholesterolemia   4. Aortic atherosclerosis (HCC)   5. Smoking      PLAN:  In order of problems listed above:  1. AS: Remains asymptomatic.  Asked her to call if she develops exertional angina dyspnea or syncope 2. HTN: Well-controlled 3. HLP: Markedly improved, all parameters in target range 4. Aortic atherosclerosis: On imaging studies she is shown to have plaque in the aorta, iliac arteries, abdominal visceral arteries. 5. Raynaud's syndrome: Currently not an issue 6. Smoking cessation: She finds it hard to even consider quitting smoking given the current stressful societal situation.  The brief twinges of pain that she has in the chest area do not sound like angina.  She has had a complaint of hard painful area that occurs intermittently in the upper abdomen for years.  She has had a variety of  imaging tests that have shown no problem.  I wonder if she is describing an unusual location for a muscle spasm.  Encourage her did start taking magnesium oxide 400 mg once daily to see if that will help.  Medication Adjustments/Labs and Tests Ordered: Current medicines are reviewed at length with the patient today.  Concerns regarding medicines are outlined above.  Medication changes, Labs and Tests ordered today are listed in the Patient Instructions below. Patient Instructions  Medication Instructions:  You may take a Magnesium Oxide 400 mg once daily.   If you need a refill on your cardiac medications before your next appointment, please call your pharmacy.   Lab work: None ordered If you have labs (blood work) drawn today and your tests are completely normal, you will receive your results only by: MyChart Message (if you have MyChart) OR A paper copy in the mail If you have any lab test that is abnormal or we need to change your treatment, we will call you to review the results.  Testing/Procedures: None ordered  Follow-Up: At [facility], you and your health needs are our priority.  As part of our continuing mission to provide you with exceptional heart care, we have created designated Provider Care Teams.  These Care Teams include your primary Cardiologist (physician) and Advanced Practice Providers (APPs -  Physician Assistants and Nurse Practitioners) who all work together to provide you with the care you need, when you need it. You will need a follow up appointment in 12 months.  Please call our office 2 months in advance to schedule this appointment.  You may see Thurmon Fair, MD or one of the following Advanced Practice Providers on your designated Care Team: Azalee Course, PA-C Micah Flesher, [place]          Signed, Thurmon Fair, MD  07/07/2019 3:48 PM    [facility] [address] [place], [place], [place]  [address] Phone: [phone]; Fax: [phone]

## 2019-07-05 NOTE — Patient Instructions (Signed)
Medication Instructions:  You may take a Magnesium Oxide 400 mg once daily.   If you need a refill on your cardiac medications before your next appointment, please call your pharmacy.   Lab work: None ordered If you have labs (blood work) drawn today and your tests are completely normal, you will receive your results only by: [place] (if you have MyChart) OR A paper copy in the mail If you have any lab test that is abnormal or we need to change your treatment, we will call you to review the results.  Testing/Procedures: None ordered  Follow-Up: At [facility], you and your health needs are our priority.  As part of our continuing mission to provide you with exceptional heart care, we have created designated Provider Care Teams.  These Care Teams include your primary Cardiologist (physician) and Advanced Practice Providers (APPs -  Physician Assistants and Nurse Practitioners) who all work together to provide you with the care you need, when you need it. You will need a follow up appointment in 12 months.  Please call our office 2 months in advance to schedule this appointment.  You may see Sanda Klein, MD or one of the following Advanced Practice Providers on your designated Care Team: Almyra Deforest, PA-C Fabian Sharp, [place]

## 2019-07-07 ENCOUNTER — Encounter: Payer: Self-pay | Admitting: Cardiovascular Disease

## 2019-11-25 ENCOUNTER — Ambulatory Visit: Payer: Medicare Other | Attending: Internal Medicine

## 2019-11-25 DIAGNOSIS — Z23 Encounter for immunization: Secondary | ICD-10-CM | POA: Insufficient documentation

## 2019-11-25 NOTE — Progress Notes (Signed)
Covid-19 Vaccination Clinic  Name:  ASMAA DERCOLE    MRN: [id number] DOB: Mar 29, 1941  11/25/2019  Ms. Sobh was observed post Covid-19 immunization for 15 minutes without incidence. She was provided with Vaccine Information Sheet and instruction to access the V-Safe system.   Ms. Willy was instructed to call 911 with any severe reactions post vaccine: Marland Kitchen Difficulty breathing  . Swelling of your face and throat  . A fast heartbeat  . A bad rash all over your body  . Dizziness and weakness    Immunizations Administered    Name Date Dose VIS Date Route   Pfizer COVID-19 Vaccine 11/25/2019 11:09 AM 0.3 mL 10/20/2019 Intramuscular   Manufacturer: [employer], [employer]   Lot: [id number]   NDC: [address]-0

## 2019-12-04 ENCOUNTER — Other Ambulatory Visit: Payer: Medicare Other

## 2019-12-16 ENCOUNTER — Ambulatory Visit: Payer: Medicare Other | Attending: Internal Medicine

## 2019-12-16 DIAGNOSIS — Z23 Encounter for immunization: Secondary | ICD-10-CM | POA: Insufficient documentation

## 2019-12-16 NOTE — Progress Notes (Signed)
Covid-19 Vaccination Clinic  Name:  Gina Harrell    MRN: [id number] DOB: 11/12/1940  12/16/2019  [name] was observed post Covid-19 immunization for 15 minutes without incidence. She was provided with Vaccine Information Sheet and instruction to access the V-Safe system.   [name] was instructed to call 911 with any severe reactions post vaccine: Marland Kitchen Difficulty breathing  . Swelling of your face and throat  . A fast heartbeat  . A bad rash all over your body  . Dizziness and weakness    Immunizations Administered    Name Date Dose VIS Date Route   Pfizer COVID-19 Vaccine 12/16/2019 10:30 AM 0.3 mL 10/20/2019 Intramuscular   Manufacturer: [employer], [employer]   Lot: [id number]   NDC: [address]-5

## 2020-03-08 ENCOUNTER — Other Ambulatory Visit: Payer: Self-pay | Admitting: Internal Medicine

## 2020-03-08 DIAGNOSIS — J449 Chronic obstructive pulmonary disease, unspecified: Secondary | ICD-10-CM

## 2020-03-08 DIAGNOSIS — Z720 Tobacco use: Secondary | ICD-10-CM

## 2020-03-12 ENCOUNTER — Other Ambulatory Visit: Payer: Self-pay | Admitting: Internal Medicine

## 2020-03-12 DIAGNOSIS — Z1231 Encounter for screening mammogram for malignant neoplasm of breast: Secondary | ICD-10-CM

## 2020-03-22 ENCOUNTER — Ambulatory Visit
Admission: RE | Admit: 2020-03-22 | Discharge: 2020-03-22 | Disposition: A | Discharge status: Home / Self Care | Payer: Medicare Other | Source: Ambulatory Visit | Attending: Internal Medicine | Admitting: Internal Medicine

## 2020-03-22 ENCOUNTER — Other Ambulatory Visit: Payer: Self-pay

## 2020-03-22 DIAGNOSIS — Z720 Tobacco use: Secondary | ICD-10-CM

## 2020-03-22 DIAGNOSIS — J449 Chronic obstructive pulmonary disease, unspecified: Secondary | ICD-10-CM

## 2020-04-30 ENCOUNTER — Ambulatory Visit
Admission: RE | Admit: 2020-04-30 | Discharge: 2020-04-30 | Disposition: A | Discharge status: Home / Self Care | Payer: Medicare Other | Source: Ambulatory Visit | Attending: Internal Medicine | Admitting: Internal Medicine

## 2020-04-30 ENCOUNTER — Other Ambulatory Visit: Payer: Self-pay

## 2020-04-30 DIAGNOSIS — Z1231 Encounter for screening mammogram for malignant neoplasm of breast: Secondary | ICD-10-CM

## 2020-07-05 ENCOUNTER — Ambulatory Visit: Payer: Medicare Other | Admitting: Cardiovascular Disease

## 2020-07-05 ENCOUNTER — Other Ambulatory Visit: Payer: Self-pay

## 2020-07-05 ENCOUNTER — Encounter: Payer: Self-pay | Admitting: Cardiovascular Disease

## 2020-07-05 VITALS — BP 138/84 | HR 56 | Ht 59.0 in | Wt 111.8 lb

## 2020-07-05 DIAGNOSIS — I350 Nonrheumatic aortic (valve) stenosis: Secondary | ICD-10-CM

## 2020-07-05 DIAGNOSIS — I10 Essential (primary) hypertension: Secondary | ICD-10-CM | POA: Diagnosis not present

## 2020-07-05 DIAGNOSIS — I700 Atherosclerosis of aorta: Secondary | ICD-10-CM

## 2020-07-05 DIAGNOSIS — E7800 Pure hypercholesterolemia, unspecified: Secondary | ICD-10-CM

## 2020-07-05 DIAGNOSIS — R0989 Other specified symptoms and signs involving the circulatory and respiratory systems: Secondary | ICD-10-CM

## 2020-07-05 DIAGNOSIS — I7300 Raynaud's syndrome without gangrene: Secondary | ICD-10-CM

## 2020-07-05 DIAGNOSIS — F17200 Nicotine dependence, unspecified, uncomplicated: Secondary | ICD-10-CM

## 2020-07-05 NOTE — Patient Instructions (Signed)
Medication Instructions:  No changes *If you need a refill on your cardiac medications before your next appointment, please call your pharmacy*   Lab Work: None ordered If you have labs (blood work) drawn today and your tests are completely normal, you will receive your results only by: Marland Kitchen MyChart Message (if you have MyChart) OR . A paper copy in the mail If you have any lab test that is abnormal or we need to change your treatment, we will call you to review the results.   Testing/Procedures: Your physician has requested that you have an echocardiogram. Echocardiography is a painless test that uses sound waves to create images of your heart. It provides your doctor with information about the size and shape of your heart and how well your heart's chambers and valves are working. You may receive an ultrasound enhancing agent through an IV if needed to better visualize your heart during the echo.This procedure takes approximately one hour. There are no restrictions for this procedure. This will take place at the [address], [address].   Your physician has requested that you have a carotid duplex. This test is an ultrasound of the carotid arteries in your neck. It looks at blood flow through these arteries that supply the brain with blood. Allow one hour for this exam. There are no restrictions or special instructions. This will take place at [address], [address].   Follow-Up: At [facility], you and your health needs are our priority.  As part of our continuing mission to provide you with exceptional heart care, we have created designated Provider Care Teams.  These Care Teams include your primary Cardiologist (physician) and Advanced Practice Providers (APPs -  Physician Assistants and Nurse Practitioners) who all work together to provide you with the care you need, when you need it.  We recommend signing up for the patient portal called "MyChart".  Sign up information is  provided on this After Visit Summary.  MyChart is used to connect with patients for Virtual Visits (Telemedicine).  Patients are able to view lab/test results, encounter notes, upcoming appointments, etc.  Non-urgent messages can be sent to your provider as well.   To learn more about what you can do with MyChart, go to [email or web address].    Your next appointment:   12 month(s)  The format for your next appointment:   In Person  Provider:   You may see Sanda Klein, MD or one of the following Advanced Practice Providers on your designated Care Team:    Almyra Deforest, PA-C  Fabian Sharp, PA-C or   Roby Lofts, [place]

## 2020-07-07 ENCOUNTER — Encounter: Payer: Self-pay | Admitting: Cardiovascular Disease

## 2020-07-07 NOTE — Progress Notes (Signed)
Patient ID: Gina Harrell, female   DOB: 02/14/1941, 79 y.o.   MRN: [id number]    Cardiology Office Note    Date:  07/07/2020   ID:  Gina Harrell, DOB 12/11/1940, MRN [id number]  PCP:  [name], MD  Cardiologist:   Thurmon Fair, MD   Chief Complaint  Patient presents with  . Cardiac Valve Problem    History of Present Illness:  Gina Harrell is a 79 y.o. female with aortic stenosis, mild carotid atherosclerosis, hypertension and hyperlipidemia who returns for routine follow-up.   She has no cardiovascular complaints.  She has been repeatedly troubled by intermittent pains in the right lower quadrants and has had CTs showing the presence of cysts in her kidneys.  There is a 9 mm cyst with increased Hounsfield units and an MRI was suggested.  It has not changed in size from previous scans.  She really does not want to have an MRI since she is very claustrophobic and anxious.  The patient specifically denies any chest pain at rest exertion, dyspnea at rest or with exertion, orthopnea, paroxysmal nocturnal dyspnea, syncope, palpitations, focal neurological deficits, intermittent claudication, lower extremity edema, unexplained weight gain, cough, hemoptysis or wheezing.  She continues to smoke a few cigarettes every day.  Recent labs from February 28, 2020 include  creatinine 1.2,  hemoglobin 15.3 normal liver function tests, cholesterol 148, triglycerides 56, LDL 78, HDL 59.  Past Medical History:  Diagnosis Date  . Cancer [facility])    Bladder cancer  . Elevated cholesterol   . GERD (gastroesophageal reflux disease)   . Heart murmur   . Heart murmur   . Hypertension   . Hypothyroidism   . Osteoporosis   . Uterine prolapse     Past Surgical History:  Procedure Laterality Date  . BLADDER TUMOR EXCISION    . BREAST EXCISIONAL BIOPSY Right   . CAROTID DOPPLER  2013  . COLONOSCOPY WITH PROPOFOL N/A 02/17/2016   Procedure: COLONOSCOPY WITH PROPOFOL;  Surgeon: Charolett Bumpers, MD;  Location: [facility];  Service: Endoscopy;  Laterality: N/A;  . DOPPLER ECHOCARDIOGRAPHY  2013  . ESOPHAGOGASTRODUODENOSCOPY (EGD) WITH PROPOFOL N/A 02/17/2016   Procedure: ESOPHAGOGASTRODUODENOSCOPY (EGD) WITH PROPOFOL;  Surgeon: Charolett Bumpers, MD;  Location: [facility];  Service: Endoscopy;  Laterality: N/A;  . VAGINAL HYSTERECTOMY      Current Medications: Outpatient Medications Prior to Visit  Medication Sig Dispense Refill  . ALPRAZolam (XANAX) 0.5 MG tablet Take 0.5 mg by mouth at bedtime.     . Cholecalciferol (VITAMIN D-3) 1000 UNITS CAPS Take 1 capsule by mouth daily. Reported on 12/26/2015    . diphenhydramine-acetaminophen (TYLENOL PM) 25-500 MG TABS Take 1 tablet by mouth at bedtime. Reported on 12/26/2015    . levothyroxine (SYNTHROID, LEVOTHROID) 50 MCG tablet Take 50 mcg by mouth daily before breakfast. Reported on 12/26/2015    . rosuvastatin (CRESTOR) 20 MG tablet Take 20 mg by mouth daily.    Marland Kitchen triamterene-hydrochlorothiazide (MAXZIDE-25) 37.5-25 MG tablet Take 1 tablet by mouth every other day.   8   No facility-administered medications prior to visit.     Allergies:   Patient has no known allergies.   Social History   Socioeconomic History  . Marital status: Divorced    Spouse name: Not on file  . Number of children: Not on file  . Years of education: Not on file  . Highest education level: Not on file  Occupational History  . Not on  file  Tobacco Use  . Smoking status: Current Every Day Smoker    Packs/day: 0.50    Years: 50.00    Pack years: 25.00    Types: Cigarettes  . Smokeless tobacco: Never Used  Substance and Sexual Activity  . Alcohol use: Yes    Alcohol/week: 7.0 standard drinks    Types: 7 Standard drinks or equivalent per week  . Drug use: No  . Sexual activity: Never    Birth control/protection: Surgical  Other Topics Concern  . Not on file  Social History Narrative  . Not on file   Social Determinants of Health    Financial Resource Strain:   . Difficulty of Paying Living Expenses: Not on file  Food Insecurity:   . Worried About Programme researcher, broadcasting/film/video in the Last Year: Not on file  . Ran Out of Food in the Last Year: Not on file  Transportation Needs:   . Lack of Transportation (Medical): Not on file  . Lack of Transportation (Non-Medical): Not on file  Physical Activity:   . Days of Exercise per Week: Not on file  . Minutes of Exercise per Session: Not on file  Stress:   . Feeling of Stress : Not on file  Social Connections:   . Frequency of Communication with Friends and Family: Not on file  . Frequency of Social Gatherings with Friends and Family: Not on file  . Attends Religious Services: Not on file  . Active Member of Clubs or Organizations: Not on file  . Attends Banker Meetings: Not on file  . Marital Status: Not on file     Family History:  The patient's  family history includes Breast cancer in her maternal aunt; Cancer in her mother; Diabetes in her maternal grandmother; Heart disease in her father; Hypertension in her mother.   ROS:   Please see the history of present illness.    All other systems are reviewed and are negative.   PHYSICAL EXAM:   VS:  BP 138/84   Pulse (!) 56   Ht 4\' 11"  (1.499 m)   Wt 111 lb 12.8 oz (50.7 kg)   SpO2 100%   BMI 22.58 kg/m       General: Alert, oriented x3, no distress, lean and appears fit.  Has mild scoliosis Head: no evidence of trauma, PERRL, EOMI, no exophtalmos or lid lag, no myxedema, no xanthelasma; normal ears, nose and oropharynx Neck: normal jugular venous pulsations and no hepatojugular reflux; brisk carotid pulses without delay, she has bilateral carotid bruits, louder on the left Chest: clear to auscultation, no signs of consolidation by percussion or palpation, normal fremitus, symmetrical and full respiratory excursions Cardiovascular: normal position and quality of the apical impulse, regular rhythm, normal  first and second heart sounds, 3-4/6 early to mid peaking systolic ejection murmur in the aortic focus with Broady radiation no diastolic murmurs, rubs or gallops Abdomen: no tenderness or distention, no masses by palpation, no abnormal pulsatility or arterial bruits, normal bowel sounds, no hepatosplenomegaly Extremities: no clubbing, cyanosis or edema; 2+ radial, ulnar and brachial pulses bilaterally; 2+ right femoral, posterior tibial and dorsalis pedis pulses; 2+ left femoral, posterior tibial and dorsalis pedis pulses; no subclavian or femoral bruits Neurological: grossly nonfocal Psych: Normal mood and affect   Wt Readings from Last 3 Encounters:  07/05/20 111 lb 12.8 oz (50.7 kg)  07/05/19 109 lb (49.4 kg)  02/25/18 109 lb 12.8 oz (49.8 kg)    Studies/Labs Reviewed:  EKG:  EKG is ordered today.  It showed sinus bradycardia, left atrial abnormality, nonspecific T wave changes in V4-V6 which are new from previous tracings and might represent changes related to LVH  Recent Labs: February 28, 2020  creatinine 1.2,  hemoglobin 15.3 normal liver function tests cholesterol 148, triglycerides 56, LDL 78, HDL 59.  ASSESSMENT:    1. Aortic valve stenosis, nonrheumatic   2. Bilateral carotid bruits   3. Essential hypertension   4. Pure hypercholesterolemia   5. Aortic atherosclerosis (HCC)   6. Raynaud's phenomenon without gangrene   7. Smoking      PLAN:  In order of problems listed above:  1. AS: She has no symptoms of aortic stenosis, but the murmur is substantially changed from last year.  I suspect there has been some progression and she has at least moderate stenosis.  Recheck echo.  Reviewed the fact that the symptoms of aortic stenosis are represented by exertional angina, dyspnea or syncope. 2. Carotid bruits: These are also substantially changed compared to a year ago.  She has not had a scan since 2013 when she had some fibrous plaque but no obstruction.  Recheck Doppler  studies. 3. HTN: Adequate control.  Usually lower than checked today. 4. HLP: All her lipid parameters are generally in the desirable range. 5. Aortic atherosclerosis: On imaging studies she is shown to have plaque in the aorta, iliac arteries, abdominal visceral arteries.  None of these have been asymptomatic to date. 6. Raynaud's syndrome: Asymptomatic at this time. 7. Smoking cessation: Unable to quit despite multiple attempts.  Encouraged her to keep trying.  The brief twinges of pain that she has in the chest area do not sound like angina.  She has had a complaint of hard painful area that occurs intermittently in the upper abdomen for years.  She has had a variety of imaging tests that have shown no problem.  I wonder if she is describing an unusual location for a muscle spasm.  Encourage her did start taking magnesium oxide 400 mg once daily to see if that will help.  Medication Adjustments/Labs and Tests Ordered: Current medicines are reviewed at length with the patient today.  Concerns regarding medicines are outlined above.  Medication changes, Labs and Tests ordered today are listed in the Patient Instructions below. Patient Instructions  Medication Instructions:  No changes *If you need a refill on your cardiac medications before your next appointment, please call your pharmacy*   Lab Work: None ordered If you have labs (blood work) drawn today and your tests are completely normal, you will receive your results only by: Marland Kitchen MyChart Message (if you have MyChart) OR . A paper copy in the mail If you have any lab test that is abnormal or we need to change your treatment, we will call you to review the results.   Testing/Procedures: Your physician has requested that you have an echocardiogram. Echocardiography is a painless test that uses sound waves to create images of your heart. It provides your doctor with information about the size and shape of your heart and how well your  heart's chambers and valves are working. You may receive an ultrasound enhancing agent through an IV if needed to better visualize your heart during the echo.This procedure takes approximately one hour. There are no restrictions for this procedure. This will take place at the [address].   Your physician has requested that you have a carotid duplex. This test is  an ultrasound of the carotid arteries in your neck. It looks at blood flow through these arteries that supply the brain with blood. Allow one hour for this exam. There are no restrictions or special instructions. This will take place at [address] [facility], [address].   Follow-Up: At [facility], you and your health needs are our priority.  As part of our continuing mission to provide you with exceptional heart care, we have created designated Provider Care Teams.  These Care Teams include your primary Cardiologist (physician) and Advanced Practice Providers (APPs -  Physician Assistants and Nurse Practitioners) who all work together to provide you with the care you need, when you need it.  We recommend signing up for the patient portal called "MyChart".  Sign up information is provided on this After Visit Summary.  MyChart is used to connect with patients for Virtual Visits (Telemedicine).  Patients are able to view lab/test results, encounter notes, upcoming appointments, etc.  Non-urgent messages can be sent to your provider as well.   To learn more about what you can do with MyChart, go to [email or web address].    Your next appointment:   12 month(s)  The format for your next appointment:   In Person  Provider:   You may see Thurmon Fair, MD or one of the following Advanced Practice Providers on your designated Care Team:    Azalee Course, PA-C  Micah Flesher, [place] or   Judy Pimple, PA-C        Signed, Thurmon Fair, MD  07/07/2020 4:20 PM    [facility] [address] [place],  [place], [place]  [address] Phone: [phone]; Fax: [phone]

## 2020-07-11 ENCOUNTER — Other Ambulatory Visit: Payer: Self-pay

## 2020-07-11 ENCOUNTER — Ambulatory Visit (HOSPITAL_COMMUNITY): Payer: Medicare Other | Attending: Cardiology

## 2020-07-11 DIAGNOSIS — I350 Nonrheumatic aortic (valve) stenosis: Secondary | ICD-10-CM | POA: Diagnosis present

## 2020-07-11 LAB — ECHOCARDIOGRAM COMPLETE
AR max vel: 1.73 cm2
AV Area VTI: 1.78 cm2
AV Area mean vel: 1.67 cm2
AV Mean grad: 15 mmHg
AV Peak grad: 28.8 mmHg
Ao pk vel: 2.68 m/s
Area-P 1/2: 3.51 cm2
P 1/2 time: 689 msec
S' Lateral: 2 cm

## 2020-07-22 ENCOUNTER — Other Ambulatory Visit: Payer: Self-pay

## 2020-07-22 ENCOUNTER — Other Ambulatory Visit (HOSPITAL_COMMUNITY): Payer: Self-pay | Admitting: Cardiovascular Disease

## 2020-07-22 ENCOUNTER — Ambulatory Visit (HOSPITAL_COMMUNITY)
Admission: RE | Admit: 2020-07-22 | Discharge: 2020-07-22 | Disposition: A | Discharge status: Home / Self Care | Payer: Medicare Other | Source: Ambulatory Visit | Attending: Cardiology | Admitting: Cardiology

## 2020-07-22 DIAGNOSIS — R0989 Other specified symptoms and signs involving the circulatory and respiratory systems: Secondary | ICD-10-CM | POA: Diagnosis present

## 2020-07-22 DIAGNOSIS — I6523 Occlusion and stenosis of bilateral carotid arteries: Secondary | ICD-10-CM

## 2020-07-30 ENCOUNTER — Other Ambulatory Visit: Payer: Self-pay | Admitting: *Deleted

## 2020-07-30 MED ORDER — EZETIMIBE 10 MG PO TABS: 10.0000 mg | ORAL_TABLET | Freq: Every day | ORAL | 3 refills | Status: DC

## 2020-09-05 ENCOUNTER — Other Ambulatory Visit: Payer: Self-pay | Admitting: Internal Medicine

## 2021-03-20 ENCOUNTER — Other Ambulatory Visit: Payer: Self-pay | Admitting: Internal Medicine

## 2021-03-20 DIAGNOSIS — Z1231 Encounter for screening mammogram for malignant neoplasm of breast: Secondary | ICD-10-CM

## 2021-05-20 ENCOUNTER — Other Ambulatory Visit: Payer: Self-pay

## 2021-05-20 ENCOUNTER — Ambulatory Visit
Admission: RE | Admit: 2021-05-20 | Discharge: 2021-05-20 | Disposition: A | Discharge status: Home / Self Care | Payer: Medicare Other | Source: Ambulatory Visit | Attending: Internal Medicine | Admitting: Internal Medicine

## 2021-05-20 DIAGNOSIS — Z1231 Encounter for screening mammogram for malignant neoplasm of breast: Secondary | ICD-10-CM

## 2021-07-22 ENCOUNTER — Other Ambulatory Visit (HOSPITAL_COMMUNITY): Payer: Self-pay | Admitting: Cardiovascular Disease

## 2021-07-22 ENCOUNTER — Other Ambulatory Visit: Payer: Self-pay

## 2021-07-22 ENCOUNTER — Ambulatory Visit (HOSPITAL_COMMUNITY)
Admission: RE | Admit: 2021-07-22 | Discharge: 2021-07-22 | Disposition: A | Discharge status: Home / Self Care | Payer: Medicare Other | Source: Ambulatory Visit | Attending: Internal Medicine | Admitting: Internal Medicine

## 2021-07-22 DIAGNOSIS — I6523 Occlusion and stenosis of bilateral carotid arteries: Secondary | ICD-10-CM

## 2021-07-23 ENCOUNTER — Other Ambulatory Visit: Payer: Self-pay | Admitting: Cardiovascular Disease

## 2021-07-25 ENCOUNTER — Encounter: Payer: Self-pay | Admitting: Cardiovascular Disease

## 2021-07-25 ENCOUNTER — Other Ambulatory Visit: Payer: Self-pay

## 2021-07-25 ENCOUNTER — Ambulatory Visit: Payer: Medicare Other | Admitting: Cardiovascular Disease

## 2021-07-25 VITALS — BP 138/80 | HR 56 | Ht 59.0 in | Wt 104.0 lb

## 2021-07-25 DIAGNOSIS — I700 Atherosclerosis of aorta: Secondary | ICD-10-CM

## 2021-07-25 DIAGNOSIS — I6522 Occlusion and stenosis of left carotid artery: Secondary | ICD-10-CM | POA: Diagnosis not present

## 2021-07-25 DIAGNOSIS — I7300 Raynaud's syndrome without gangrene: Secondary | ICD-10-CM

## 2021-07-25 DIAGNOSIS — F17200 Nicotine dependence, unspecified, uncomplicated: Secondary | ICD-10-CM

## 2021-07-25 DIAGNOSIS — E7800 Pure hypercholesterolemia, unspecified: Secondary | ICD-10-CM | POA: Diagnosis not present

## 2021-07-25 DIAGNOSIS — I10 Essential (primary) hypertension: Secondary | ICD-10-CM

## 2021-07-25 DIAGNOSIS — I350 Nonrheumatic aortic (valve) stenosis: Secondary | ICD-10-CM

## 2021-07-25 MED ORDER — EZETIMIBE 10 MG PO TABS: 10.0000 mg | ORAL_TABLET | Freq: Every day | ORAL | 3 refills | Status: DC

## 2021-07-25 NOTE — Patient Instructions (Signed)
Medication Instructions:  No changes *If you need a refill on your cardiac medications before your next appointment, please call your pharmacy*   Lab Work: None ordered If you have labs (blood work) drawn today and your tests are completely normal, you will receive your results only by: [place] (if you have MyChart) OR A paper copy in the mail If you have any lab test that is abnormal or we need to change your treatment, we will call you to review the results.   Testing/Procedures: None ordered   Follow-Up: At [facility], you and your health needs are our priority.  As part of our continuing mission to provide you with exceptional heart care, we have created designated Provider Care Teams.  These Care Teams include your primary Cardiologist (physician) and Advanced Practice Providers (APPs -  Physician Assistants and Nurse Practitioners) who all work together to provide you with the care you need, when you need it.  We recommend signing up for the patient portal called "MyChart".  Sign up information is provided on this After Visit Summary.  MyChart is used to connect with patients for Virtual Visits (Telemedicine).  Patients are able to view lab/test results, encounter notes, upcoming appointments, etc.  Non-urgent messages can be sent to your provider as well.   To learn more about what you can do with MyChart, go to [email or web address].    Your next appointment:   12 month(s)  The format for your next appointment:   In Person  Provider:   You may see Dr. Sallyanne Kuster or one of the following Advanced Practice Providers on your designated Care Team:   Almyra Deforest, PA-C Fabian Sharp, PA-C or  Roby Lofts, [place]

## 2021-07-25 NOTE — Progress Notes (Signed)
Patient ID: Gina Harrell, female   DOB: 01-Mar-1941, 80 y.o.   MRN: [id number]    Cardiology Office Note    Date:  07/28/2021   ID:  Gina Harrell, DOB [birth year], MRN [id number]  PCP:  [name], MD  Cardiologist:   Thurmon Fair, MD   Chief Complaint  Patient presents with   Cardiac Valve Problem     History of Present Illness:  Gina Harrell is a 80 y.o. female with aortic stenosis, mild carotid atherosclerosis, hypertension and hyperlipidemia who returns for routine follow-up.   From a cardiac point of view, Gina Harrell feels great. The patient specifically denies any chest pain at rest exertion, dyspnea at rest or with exertion, orthopnea, paroxysmal nocturnal dyspnea, syncope, palpitations, focal neurological deficits, intermittent claudication, lower extremity edema, unexplained weight gain, cough, hemoptysis or wheezing.  Unfortunately, she twisted her ankle between her coffee table and other piece of furniture in her house and has a sprain.  She has been wearing an orthopedic boot.  Now she is in a splint.  It is improving.  She was diagnosed with macular degeneration at her last ophthalmological exam, but it is mild and not interfering with her vision.  She is due for a follow-up visit with Dr. Jacky Kindle in November when she will have a repeat lipid profile checked.  At her last appointment her LDL was 78.  Unfortunately, she still smokes a few cigarettes every day.  Her carotid Doppler ultrasound has shown stable findings.  The 1 performed just a couple of days ago again showed a 40-59% stenosis in the left internal carotid artery, unchanged.  She has mild plaque on the right side.  Past Medical History:  Diagnosis Date   Cancer [facility])    Bladder cancer   Elevated cholesterol    GERD (gastroesophageal reflux disease)    Heart murmur    Heart murmur    Hypertension    Hypothyroidism    Osteoporosis    Uterine prolapse     Past Surgical History:   Procedure Laterality Date   BLADDER TUMOR EXCISION     BREAST EXCISIONAL BIOPSY Right    CAROTID DOPPLER  2013   COLONOSCOPY WITH PROPOFOL N/A 02/17/2016   Procedure: COLONOSCOPY WITH PROPOFOL;  Surgeon: Charolett Bumpers, MD;  Location: [facility];  Service: Endoscopy;  Laterality: N/A;   DOPPLER ECHOCARDIOGRAPHY  2013   ESOPHAGOGASTRODUODENOSCOPY (EGD) WITH PROPOFOL N/A 02/17/2016   Procedure: ESOPHAGOGASTRODUODENOSCOPY (EGD) WITH PROPOFOL;  Surgeon: Charolett Bumpers, MD;  Location: [facility];  Service: Endoscopy;  Laterality: N/A;   VAGINAL HYSTERECTOMY      Current Medications: Outpatient Medications Prior to Visit  Medication Sig Dispense Refill   ALPRAZolam (XANAX) 0.5 MG tablet Take 0.5 mg by mouth at bedtime.      Cholecalciferol (VITAMIN D-3) 1000 UNITS CAPS Take 1 capsule by mouth daily. Reported on 12/26/2015     diphenhydramine-acetaminophen (TYLENOL PM) 25-500 MG TABS Take 1 tablet by mouth at bedtime. Reported on 12/26/2015     levothyroxine (SYNTHROID, LEVOTHROID) 50 MCG tablet Take 50 mcg by mouth daily before breakfast. Reported on 12/26/2015     rosuvastatin (CRESTOR) 20 MG tablet Take 20 mg by mouth daily.     triamterene-hydrochlorothiazide (MAXZIDE-25) 37.5-25 MG tablet Take 1 tablet by mouth every other day.   8   ezetimibe (ZETIA) 10 MG tablet TAKE 1 TABLET BY MOUTH EVERY DAY 90 tablet 3   No facility-administered medications prior to visit.  Allergies:   Patient has no known allergies.   Social History   Socioeconomic History   Marital status: Divorced    Spouse name: Not on file   Number of children: Not on file   Years of education: Not on file   Highest education level: Not on file  Occupational History   Not on file  Tobacco Use   Smoking status: Every Day    Packs/day: 0.50    Years: 50.00    Pack years: 25.00    Types: Cigarettes   Smokeless tobacco: Never  Substance and Sexual Activity   Alcohol use: Yes    Alcohol/week: 7.0 standard  drinks    Types: 7 Standard drinks or equivalent per week   Drug use: No   Sexual activity: Never    Birth control/protection: Surgical  Other Topics Concern   Not on file  Social History Narrative   Not on file   Social Determinants of Health   Financial Resource Strain: Not on file  Food Insecurity: Not on file  Transportation Needs: Not on file  Physical Activity: Not on file  Stress: Not on file  Social Connections: Not on file     Family History:  The patient's  family history includes Breast cancer in her maternal aunt; Cancer in her mother; Diabetes in her maternal grandmother; Heart disease in her father; Hypertension in her mother.   ROS:   Please see the history of present illness.    All other systems are reviewed and are negative.   PHYSICAL EXAM:   VS:  BP 138/80   Pulse (!) 56   Ht 4\' 11"  (1.499 m)   Wt 104 lb (47.2 kg)   SpO2 97%   BMI 21.01 kg/m      General: Alert, oriented x3, no distress, lean and appears fit Head: no evidence of trauma, PERRL, EOMI, no exophtalmos or lid lag, no myxedema, no xanthelasma; normal ears, nose and oropharynx Neck: normal jugular venous pulsations and no hepatojugular reflux; brisk carotid pulses without delay and no carotid bruits Chest: clear to auscultation, no signs of consolidation by percussion or palpation, normal fremitus, symmetrical and full respiratory excursions Cardiovascular: normal position and quality of the apical impulse, regular rhythm, normal first and second heart sounds, 3/6 musical aortic ejection murmur is still early peaking, no diastolic murmurs, rubs or gallops Abdomen: no tenderness or distention, no masses by palpation, no abnormal pulsatility or arterial bruits, normal bowel sounds, no hepatosplenomegaly Extremities: no clubbing, cyanosis or edema; 2+ radial, ulnar and brachial pulses bilaterally; 2+ right femoral, posterior tibial and dorsalis pedis pulses; 2+ left femoral, posterior tibial and  dorsalis pedis pulses; no subclavian or femoral bruits Neurological: grossly nonfocal Psych: Normal mood and affect    Wt Readings from Last 3 Encounters:  07/25/21 104 lb (47.2 kg)  07/05/20 111 lb 12.8 oz (50.7 kg)  07/05/19 109 lb (49.4 kg)    Studies/Labs Reviewed:   ECHO 07/11/2020:   1. Left ventricular ejection fraction, by estimation, is 65 to 70%. The  left ventricle has normal function. The left ventricle has no regional  wall motion abnormalities. Left ventricular diastolic parameters are  consistent with Grade I diastolic  dysfunction (impaired relaxation).   2. Right ventricular systolic function is moderately reduced. The right  ventricular size is normal. There is normal pulmonary artery systolic  pressure. The estimated right ventricular systolic pressure is 28.4 mmHg.   3. The mitral valve is normal in structure. No evidence of  mitral valve  regurgitation. No evidence of mitral stenosis.   4. The aortic valve is tricuspid. Aortic valve regurgitation is mild.  Mild aortic valve stenosis. Aortic valve mean gradient measures 15.0 mmHg.   5. The inferior vena cava is normal in size with greater than 50%  respiratory variability, suggesting right atrial pressure of 3 mmHg.  Carotid Doppler 07/22/2021:   Summary:  Right Carotid: Velocities in the right ICA are consistent with a 1-39%  stenosis.                 Non-hemodynamically significant plaque <50% noted in the  CCA.                 Stable RICA velocities.   Left Carotid: Velocities in the left ICA are consistent with a 40-59%  stenosis.                Non-hemodynamically significant plaque <50% noted in the  CCA.                Stable LICA velocities.   Vertebrals:  Bilateral vertebral arteries demonstrate antegrade flow.  Subclavians: Normal flow hemodynamics were seen in bilateral subclavian               arteries.     EKG:  EKG is ordered today.  It shows sinus bradycardia and possible left atrial  abnormality, unchanged minor nonspecific T wave changes in V4-V5.  QTc 424 ms.  Recent Labs: February 28, 2020  creatinine 1.2,  hemoglobin 15.3 normal liver function tests cholesterol 148, triglycerides 56, LDL 78, HDL 59.  ASSESSMENT:    1. Aortic valve stenosis, nonrheumatic   2. Carotid stenosis, asymptomatic, left   3. Essential hypertension   4. Pure hypercholesterolemia   5. Aortic atherosclerosis (HCC)   6. Raynaud's phenomenon without gangrene   7. Smoking       PLAN:  In order of problems listed above:  AS: Asymptomatic.  Trileaflet aortic valve with calcific changes and mild aortic stenosis, mean gradient 15 mmHg on  Echo performed in 2021.  We will plan to recheck this in another year, but will do it sooner if she develops exertional angina/exertional dyspnea/exertional syncope.  We have previously discussed the option of future TAVR versus SAVR depending on the presence or absence of coronary disease or aortic dilation. Carotid bruits: Stable findings from 2021 to 2022.  Recheck this on an every third year basis, sooner if she has any neurological complaints. HTN: Adequate control. HLP: Target LDL less than 70.  Will have labs in November. Aortic atherosclerosis: Well documented on a variety of imaging studies where she is shown to have plaque in the aorta, iliac arteries, abdominal visceral arteries.  None of these have been asymptomatic to date. Raynaud's syndrome: No complaints in a long time. Smoking: Again strongly encourage smoking cessation.   Medication Adjustments/Labs and Tests Ordered: Current medicines are reviewed at length with the patient today.  Concerns regarding medicines are outlined above.  Medication changes, Labs and Tests ordered today are listed in the Patient Instructions below. Patient Instructions  Medication Instructions:  No changes *If you need a refill on your cardiac medications before your next appointment, please call your  pharmacy*   Lab Work: None ordered If you have labs (blood work) drawn today and your tests are completely normal, you will receive your results only by: MyChart Message (if you have MyChart) OR A paper copy in the mail If you have any lab  test that is abnormal or we need to change your treatment, we will call you to review the results.   Testing/Procedures: None ordered   Follow-Up: At [facility], you and your health needs are our priority.  As part of our continuing mission to provide you with exceptional heart care, we have created designated Provider Care Teams.  These Care Teams include your primary Cardiologist (physician) and Advanced Practice Providers (APPs -  Physician Assistants and Nurse Practitioners) who all work together to provide you with the care you need, when you need it.  We recommend signing up for the patient portal called "MyChart".  Sign up information is provided on this After Visit Summary.  MyChart is used to connect with patients for Virtual Visits (Telemedicine).  Patients are able to view lab/test results, encounter notes, upcoming appointments, etc.  Non-urgent messages can be sent to your provider as well.   To learn more about what you can do with MyChart, go to [email or web address].    Your next appointment:   12 month(s)  The format for your next appointment:   In Person  Provider:   You may see Dr. Royann Shivers or one of the following Advanced Practice Providers on your designated Care Team:   Azalee Course, PA-C Micah Flesher, [place] or  Judy Pimple, PA-C    Signed, Thurmon Fair, MD  07/28/2021 9:04 PM    [facility] [address] [place], [place], [place]  [address] Phone: [phone]; Fax: [phone]

## 2021-07-28 ENCOUNTER — Encounter: Payer: Self-pay | Admitting: Cardiovascular Disease

## 2022-02-20 ENCOUNTER — Ambulatory Visit (HOSPITAL_COMMUNITY)
Admission: EM | Admit: 2022-02-20 | Discharge: 2022-02-20 | Disposition: A | Discharge status: Home / Self Care | Payer: Medicare Other | Attending: Internal Medicine | Admitting: Internal Medicine

## 2022-02-20 ENCOUNTER — Encounter (HOSPITAL_COMMUNITY): Payer: Self-pay | Admitting: Emergency Medicine

## 2022-02-20 DIAGNOSIS — N3001 Acute cystitis with hematuria: Secondary | ICD-10-CM | POA: Insufficient documentation

## 2022-02-20 LAB — POCT URINALYSIS DIPSTICK, ED / UC
Bilirubin Urine: NEGATIVE
Glucose, UA: NEGATIVE mg/dL
Ketones, ur: NEGATIVE mg/dL
Nitrite: NEGATIVE
Protein, ur: NEGATIVE mg/dL
Specific Gravity, Urine: <=1.005 (ref 1.005–1.030)
Urobilinogen, UA: 0.2 mg/dL (ref 0.0–1.0)
pH: 5 (ref 5.0–8.0)

## 2022-02-20 MED ORDER — CEPHALEXIN 500 MG PO CAPS: 500.0000 mg | ORAL_CAPSULE | Freq: Two times a day (BID) | ORAL | 0 refills | Status: DC

## 2022-02-20 NOTE — ED Triage Notes (Signed)
Pt reports woke up this morning with extreme bladder pressure and urinary frequency. States she has a UTI or bladder infection. Hx of bladder cancer. Denies hematuria and odorous urine.  ?

## 2022-02-20 NOTE — Discharge Instructions (Addendum)
You have been seen and evaluated today for a urinary tract infection.  I have prescribed Keflex antibiotic for you to take for 5 days twice a day.  Please take this with food to avoid stomach upset.  You may take a probiotic with this if you develop diarrhea from taking this antibiotic.  Please continue to drink plenty of fluids and return if you develop any flank pain, fever, vomiting, or any new or worsening symptoms. ? ?Use follow-up with [facility] due to your significant history of bladder problems.  I would like for them to repeat a urinalysis after you have had this antibiotic to make sure that this urinary tract infection is well controlled with the Keflex.  ? ?Was my pleasure taking care of you today!  ?

## 2022-02-20 NOTE — ED Provider Notes (Addendum)
[facility]    CSN: [id number] Arrival date & time: 02/20/22  1022      History   Chief Complaint Chief Complaint  Patient presents with   Bladder Pressure    Urinary Frequency    HPI Gina Harrell is a 81 y.o. female.   Patient presents to urgent care for evaluation of frequency and bladder pressure that started upon waking this morning. Reports urinary frequency for the last few days and that she She states that she felt "normal yesterday" and denies urgency, dysuria, nausea, vomiting, or flank pain.  Reports history of bladder cancer 27 years ago and currently follows with [facility].  She states her symptoms feel very similar to her last urinary tract infection and reports that this was 3 years ago.  She takes cranberry supplement for prevention.  Denies vaginal dryness, abnormal vaginal bleeding, and hematuria.  Denies hospitalization for urinary tract infection in the past.  She does report slightly decreased appetite over the last 1 to 2 months.  Denies night sweats, weight loss, and fever.  States that she regularly receives care from her primary care provider at [facility] and her last yearly physical was in May 2022.    Urinary Frequency   Past Medical History:  Diagnosis Date   Cancer [facility])    Bladder cancer   Elevated cholesterol    GERD (gastroesophageal reflux disease)    Heart murmur    Heart murmur    Hypertension    Hypothyroidism    Osteoporosis    Uterine prolapse     Patient Active Problem List   Diagnosis Date Noted   Aortic atherosclerosis (HCC) 02/25/2018   Raynaud phenomenon 02/25/2018   Cerumen impaction 01/07/2016   Aortic stenosis 01/11/2014   Hypercholesteremia 01/11/2014   Tobacco abuse 01/11/2014   HTN (hypertension) 06/05/2012   Uterine prolapse    Cancer (HCC)    Osteoporosis     Past Surgical History:  Procedure Laterality Date   BLADDER TUMOR EXCISION     BREAST EXCISIONAL BIOPSY Right     CAROTID DOPPLER  2013   COLONOSCOPY WITH PROPOFOL N/A 02/17/2016   Procedure: COLONOSCOPY WITH PROPOFOL;  Surgeon: Charolett Bumpers, MD;  Location: [facility];  Service: Endoscopy;  Laterality: N/A;   DOPPLER ECHOCARDIOGRAPHY  2013   ESOPHAGOGASTRODUODENOSCOPY (EGD) WITH PROPOFOL N/A 02/17/2016   Procedure: ESOPHAGOGASTRODUODENOSCOPY (EGD) WITH PROPOFOL;  Surgeon: Charolett Bumpers, MD;  [facility];  Service: Endoscopy;  Laterality: N/A;   VAGINAL HYSTERECTOMY      OB History     Gravida  3   Para  3   Term  3   Preterm      AB      Living  3      SAB      IAB      Ectopic      Multiple      Live Births               Home Medications    Prior to Admission medications   Medication Sig Start Date End Date Taking? Authorizing Provider  ALPRAZolam Prudy Feeler) 0.5 MG tablet Take 0.5 mg by mouth at bedtime.     [provider]  cephALEXin (KEFLEX) 500 MG capsule Take 1 capsule (500 mg total) by mouth 2 (two) times daily. 02/20/22   Carlisle Beers, FNP  Cholecalciferol (VITAMIN D-3) 1000 UNITS CAPS Take 1 capsule by mouth daily. Reported on 12/26/2015  [provider]  diphenhydramine-acetaminophen (TYLENOL PM) 25-500 MG TABS Take 1 tablet by mouth at bedtime. Reported on 12/26/2015    [provider]  ezetimibe (ZETIA) 10 MG tablet Take 1 tablet (10 mg total) by mouth daily. 07/25/21   Croitoru, Mihai, MD  levothyroxine (SYNTHROID, LEVOTHROID) 50 MCG tablet Take 50 mcg by mouth daily before breakfast. Reported on 12/26/2015    [provider]  rosuvastatin (CRESTOR) 20 MG tablet Take 20 mg by mouth daily.    [provider]  triamterene-hydrochlorothiazide (MAXZIDE-25) 37.5-25 MG tablet Take 1 tablet by mouth every other day.  12/10/15   [provider]    Family History Family History  Problem Relation Age of Onset   Cancer Mother        Colon cancer   Hypertension Mother    Heart disease Father     Breast cancer Maternal Aunt        Age 41's   Diabetes Maternal Grandmother     Social History Social History   Tobacco Use   Smoking status: Every Day    Packs/day: 0.50    Years: 50.00    Pack years: 25.00    Types: Cigarettes   Smokeless tobacco: Never  Substance Use Topics   Alcohol use: Yes    Alcohol/week: 7.0 standard drinks    Types: 7 Standard drinks or equivalent per week   Drug use: No     Allergies   Patient has no known allergies.   Review of Systems Review of Systems  Genitourinary:  Positive for frequency and pelvic pain. Negative for decreased urine volume, hematuria and urgency.  As stated in HPI  Physical Exam Triage Vital Signs ED Triage Vitals  Enc Vitals Group     BP 02/20/22 1111 (!) 153/65     Pulse Rate 02/20/22 1111 64     Resp 02/20/22 1111 15     Temp 02/20/22 1111 98 F (36.7 C)     Temp Source 02/20/22 1111 Oral     SpO2 02/20/22 1111 100 %     Weight 02/20/22 1109 104 lb 0.9 oz (47.2 kg)     Height 02/20/22 1109 4\' 11"  (1.499 m)     Head Circumference --      Peak Flow --      Pain Score 02/20/22 1109 8     Pain Loc --      Pain Edu? --      Excl. in GC? --    No data found.  Updated Vital Signs BP (!) 153/65 (BP Location: Right Arm)   Pulse 64   Temp 98 F (36.7 C) (Oral)   Resp 15   Ht 4\' 11"  (1.499 m)   Wt 104 lb 0.9 oz (47.2 kg)   SpO2 100%   BMI 21.02 kg/m   Visual Acuity Right Eye Distance:   Left Eye Distance:   Bilateral Distance:    Right Eye Near:   Left Eye Near:    Bilateral Near:     Physical Exam Vitals and nursing note reviewed.  Constitutional:      General: She is not in acute distress.    Appearance: Normal appearance. She is well-developed. She is not ill-appearing.  HENT:     Head: Normocephalic and atraumatic.  Eyes:     Conjunctiva/sclera: Conjunctivae normal.  Cardiovascular:     Rate and Rhythm: Normal rate and regular rhythm.     Heart sounds: Normal heart sounds. No murmur  heard.    Comments: Click from aortic valve heard on cardiovascular exam. Pulmonary:     Effort: Pulmonary effort is normal. No respiratory distress.     Breath sounds: Normal breath sounds.  Abdominal:     General: Abdomen is flat. Bowel sounds are normal. There is no distension.     Palpations: Abdomen is soft.     Tenderness: There is abdominal tenderness in the right lower quadrant and suprapubic area. There is no right CVA tenderness or left CVA tenderness.     Comments: Abdominal tenderness is likely due to urinary tract infection.  Nontender to palpation of flanks bilaterally.  Musculoskeletal:        General: No swelling.     Cervical back: Neck supple.  Skin:    General: Skin is warm and dry.     Capillary Refill: Capillary refill takes less than 2 seconds.  Neurological:     General: No focal deficit present.     Mental Status: She is alert and oriented to person, place, and time.  Psychiatric:        Mood and Affect: Mood normal.     UC Treatments / Results  Labs (all labs ordered are listed, but only abnormal results are displayed) Labs Reviewed  POCT URINALYSIS DIPSTICK, ED / UC - Abnormal; Notable for the following components:      Result Value   Hgb urine dipstick LARGE (*)    Leukocytes,Ua MODERATE (*)    All other components within normal limits  URINE CULTURE    EKG   Radiology No results found.  Procedures Procedures (including critical care time)  Medications Ordered in UC Medications - No data to display  Initial Impression / Assessment and Plan / UC Course  I have reviewed the triage vital signs and the nursing notes.  Pertinent labs & imaging results that were available during my care of the patient were reviewed by me and considered in my medical decision making (see chart for details).  Patient's symptoms and urinalysis are consistent with bladder infection.  Patient is not tender to palpation of flanks bilaterally, denies fever, nausea  and heart palpitations. Doubt pyelonephritis or systemic etiology at this time.  Urinalysis today showed large hemoglobin and moderate leukocytes.  Urine sent for culture and patient will be informed in the next 2-3 days if culture results change antibiotic treatment. Patient will be treated with Keflex twice daily for 5 days at 500 mg per dose.  Patient instructed to take a probiotic with this that she experience any diarrhea.  Also instructed to take Keflex with food to avoid GI upset.   Instructed to follow-up with [facility] for repeat urinalysis to confirm improvement from UTI and due to significant history of bladder cancer.  Patient does report decreased appetite at this time, low suspicion for acute malignancy. Denies night sweats, fever, fatigue, and significant weight loss.  No clinical indication for blood work or further diagnostic imaging at this time.   Instructed to return to urgent care sooner for flank pain, nausea, vomiting, fever, and or any new or worsening symptoms. Instructed to follow-up with her primary care provider for ongoing medical care and wellness visits.  Patient verbalizes understanding and agrees with this plan.  Final Clinical Impressions(s) / UC Diagnoses   Final diagnoses:  Acute cystitis with hematuria     Discharge Instructions      You have been seen and evaluated today for a urinary tract infection.  I have prescribed  Keflex antibiotic for you to take for 5 days twice a day.  Please take this with food to avoid stomach upset.  You may take a probiotic with this if you develop diarrhea from taking this antibiotic.  Please continue to drink plenty of fluids and return if you develop any flank pain, fever, vomiting, or any new or worsening symptoms.  Use follow-up with [facility] due to your significant history of bladder problems.  I would like for them to repeat a urinalysis after you have had this antibiotic to make sure that this urinary tract  infection is well controlled with the Keflex.   Was my pleasure taking care of you today!      ED Prescriptions     Medication Sig Dispense Auth. Provider   cephALEXin (KEFLEX) 500 MG capsule  (Status: Discontinued) Take 1 capsule (500 mg total) by mouth 2 (two) times daily. 20 capsule Reita May M, FNP   cephALEXin (KEFLEX) 500 MG capsule Take 1 capsule (500 mg total) by mouth 2 (two) times daily. 10 capsule Carlisle Beers, FNP      PDMP not reviewed this encounter.   Carlisle Beers, FNP 02/20/22 1226    Reita May [place], [place] 02/20/22 1229

## 2022-02-21 LAB — URINE CULTURE: Culture: <10000 — AB

## 2022-04-08 ENCOUNTER — Other Ambulatory Visit: Payer: Self-pay | Admitting: Internal Medicine

## 2022-04-08 DIAGNOSIS — Z1231 Encounter for screening mammogram for malignant neoplasm of breast: Secondary | ICD-10-CM

## 2022-05-21 ENCOUNTER — Ambulatory Visit
Admission: RE | Admit: 2022-05-21 | Discharge: 2022-05-21 | Disposition: A | Discharge status: Home / Self Care | Payer: Medicare Other | Source: Ambulatory Visit | Attending: Internal Medicine | Admitting: Internal Medicine

## 2022-05-21 DIAGNOSIS — Z1231 Encounter for screening mammogram for malignant neoplasm of breast: Secondary | ICD-10-CM

## 2022-07-22 ENCOUNTER — Ambulatory Visit (HOSPITAL_COMMUNITY)
Admission: RE | Admit: 2022-07-22 | Discharge: 2022-07-22 | Disposition: A | Discharge status: Home / Self Care | Payer: Medicare Other | Source: Ambulatory Visit | Attending: Cardiovascular Disease | Admitting: Cardiovascular Disease

## 2022-07-22 DIAGNOSIS — I6523 Occlusion and stenosis of bilateral carotid arteries: Secondary | ICD-10-CM | POA: Diagnosis present

## 2022-07-23 ENCOUNTER — Other Ambulatory Visit: Payer: Self-pay | Admitting: *Deleted

## 2022-07-23 DIAGNOSIS — I6522 Occlusion and stenosis of left carotid artery: Secondary | ICD-10-CM

## 2022-09-17 ENCOUNTER — Ambulatory Visit: Payer: Medicare Other | Attending: Cardiovascular Disease | Admitting: Cardiovascular Disease

## 2022-09-17 ENCOUNTER — Encounter: Payer: Self-pay | Admitting: Cardiovascular Disease

## 2022-09-17 VITALS — BP 128/78 | HR 63 | Ht 60.0 in | Wt 111.0 lb

## 2022-09-17 DIAGNOSIS — E7800 Pure hypercholesterolemia, unspecified: Secondary | ICD-10-CM

## 2022-09-17 DIAGNOSIS — I6522 Occlusion and stenosis of left carotid artery: Secondary | ICD-10-CM

## 2022-09-17 DIAGNOSIS — I350 Nonrheumatic aortic (valve) stenosis: Secondary | ICD-10-CM | POA: Diagnosis not present

## 2022-09-17 DIAGNOSIS — I700 Atherosclerosis of aorta: Secondary | ICD-10-CM

## 2022-09-17 DIAGNOSIS — I10 Essential (primary) hypertension: Secondary | ICD-10-CM | POA: Diagnosis not present

## 2022-09-17 DIAGNOSIS — F17200 Nicotine dependence, unspecified, uncomplicated: Secondary | ICD-10-CM

## 2022-09-17 DIAGNOSIS — IMO0001 Reserved for inherently not codable concepts without codable children: Secondary | ICD-10-CM

## 2022-09-17 NOTE — Patient Instructions (Signed)
Medication Instructions:  The current medical regimen is effective;  continue present plan and medications.  *If you need a refill on your cardiac medications before your next appointment, please call your pharmacy*   Testing/Procedures:  Echocardiogram (12 months)  - Your physician has requested that you have an echocardiogram. Echocardiography is a painless test that uses sound waves to create images of your heart. It provides your doctor with information about the size and shape of your heart and how well your heart's chambers and valves are working. This procedure takes approximately one hour. There are no restrictions for this procedure.     Follow-Up: At [facility], you and your health needs are our priority.  As part of our continuing mission to provide you with exceptional heart care, we have created designated Provider Care Teams.  These Care Teams include your primary Cardiologist (physician) and Advanced Practice Providers (APPs -  Physician Assistants and Nurse Practitioners) who all work together to provide you with the care you need, when you need it.  We recommend signing up for the patient portal called "MyChart".  Sign up information is provided on this After Visit Summary.  MyChart is used to connect with patients for Virtual Visits (Telemedicine).  Patients are able to view lab/test results, encounter notes, upcoming appointments, etc.  Non-urgent messages can be sent to your provider as well.   To learn more about what you can do with MyChart, go to [email or web address].    Your next appointment:   12 month(s)  The format for your next appointment:   In Person  Provider:   Dr.Croitoru

## 2022-09-20 ENCOUNTER — Encounter: Payer: Self-pay | Admitting: Cardiovascular Disease

## 2022-09-20 NOTE — Progress Notes (Signed)
Patient ID: Gina Harrell, female   DOB: [birth year], 81 y.o.   MRN: [id number]     Cardiology Office Note    Date:  09/20/2022   ID:  Gina Harrell, DOB 09/04/1941, MRN [id number]  PCP:  [name], MD  Cardiologist:   Thurmon Fair, MD   Chief Complaint  Patient presents with   Cardiac Valve Problem     History of Present Illness:  Gina Harrell is a 81 y.o. female with aortic stenosis, mild carotid atherosclerosis, hypertension and hyperlipidemia who returns for routine follow-up.   Gina Harrell continues to feel well.  She went on a cruise with her children and some of the days she walked as much as 11,000 steps.  She did not have any problems with shortness of breath or chest pain during the trip.  She continues to have to climb 16 steps to get to her apartment every day.  She has no difficulty except when she is carrying the grocery bags when she sometimes has to stop at the middle landing.  Overall there has been no change in her stamina.  She does not have dizziness or syncope or palpitations.  She denies lower extremity edema, orthopnea, PND, neurological complaints or claudication.  Unfortunately, she still smokes a few cigarettes every day.  Her most recent lipid profile showed an excellent LDL cholesterol 49.  She does not have diabetes mellitus.  Her blood pressure is very well controlled.  Her carotid Doppler ultrasound has shown stable findings.  Most recently performed 07/22/2022, continues to show a stable 40-59% stenosis in the left internal carotid artery and minimal plaque on the right.  Her aortic valve murmur sounds more prominent today and is radiating to both carotids.  Past Medical History:  Diagnosis Date   Cancer [facility])    Bladder cancer   Elevated cholesterol    GERD (gastroesophageal reflux disease)    Heart murmur    Heart murmur    Hypertension    Hypothyroidism    Osteoporosis    Uterine prolapse     Past Surgical History:   Procedure Laterality Date   BLADDER TUMOR EXCISION     BREAST EXCISIONAL BIOPSY Right    CAROTID DOPPLER  2013   COLONOSCOPY WITH PROPOFOL N/A 02/17/2016   Procedure: COLONOSCOPY WITH PROPOFOL;  Surgeon: Charolett Bumpers, MD;  Location: [facility] END[facility]COPY;  Service: Endoscopy;  Laterality: N/A;   DOPPLER ECHOCARDIOGRAPHY  2013   ESOPHAGOGASTRODUODENOSCOPY (EGD) WITH PROPOFOL N/A 02/17/2016   Procedure: ESOPHAGOGASTRODUODENOSCOPY (EGD) WITH PROPOFOL;  Surgeon: Charolett Bumpers, MD;  Location: [facility];  Service: Endoscopy;  Laterality: N/A;   VAGINAL HYSTERECTOMY      Current Medications: Outpatient Medications Prior to Visit  Medication Sig Dispense Refill   ALPRAZolam (XANAX) 0.5 MG tablet Take 0.5 mg by mouth at bedtime.      Cholecalciferol (VITAMIN D-3) 1000 UNITS CAPS Take 1 capsule by mouth daily. Reported on 12/26/2015     CRANBERRY PO Take 2 capsules by mouth daily.     diphenhydramine-acetaminophen (TYLENOL PM) 25-500 MG TABS Take 1 tablet by mouth at bedtime. Reported on 12/26/2015     escitalopram (LEXAPRO) 10 MG tablet Take 10 mg by mouth daily.     ezetimibe (ZETIA) 10 MG tablet Take 1 tablet (10 mg total) by mouth daily. 90 tablet 3   levothyroxine (SYNTHROID, LEVOTHROID) 50 MCG tablet Take 50 mcg by mouth daily before breakfast. Reported on 12/26/2015     rosuvastatin (CRESTOR) 20 MG  tablet Take 20 mg by mouth daily.     triamterene-hydrochlorothiazide (MAXZIDE-25) 37.5-25 MG tablet Take 1 tablet by mouth every other day.   8   cephALEXin (KEFLEX) 500 MG capsule Take 1 capsule (500 mg total) by mouth 2 (two) times daily. (Patient not taking: Reported on 09/17/2022) 10 capsule 0   No facility-administered medications prior to visit.     Allergies:   Patient has no known allergies.   Social History   Socioeconomic History   Marital status: Divorced    Spouse name: Not on file   Number of children: Not on file   Years of education: Not on file   Highest education level:  Not on file  Occupational History   Not on file  Tobacco Use   Smoking status: Every Day    Packs/day: 0.50    Years: 50.00    Total pack years: 25.00    Types: Cigarettes   Smokeless tobacco: Never  Substance and Sexual Activity   Alcohol use: Yes    Alcohol/week: 7.0 standard drinks of alcohol    Types: 7 Standard drinks or equivalent per week   Drug use: No   Sexual activity: Never    Birth control/protection: Surgical  Other Topics Concern   Not on file  Social History Narrative   Not on file   Social Determinants of Health   Financial Resource Strain: Not on file  Food Insecurity: Not on file  Transportation Needs: Not on file  Physical Activity: Not on file  Stress: Not on file  Social Connections: Not on file     Family History:  The patient's  family history includes Breast cancer in her maternal aunt; Cancer in her mother; Diabetes in her maternal grandmother; Heart disease in her father; Hypertension in her mother.   ROS:   Please see the history of present illness.    All other systems are reviewed and are negative.   PHYSICAL EXAM:   VS:  BP 128/78 (BP Location: Left Arm, Patient Position: Sitting, Cuff Size: Normal)   Pulse 63   Ht 5' (1.524 m)   Wt 50.3 kg   SpO2 96%   BMI 21.68 kg/m       General: Alert, oriented x3, no distress, appears lean and fit, younger than stated age Head: no evidence of trauma, PERRL, EOMI, no exophtalmos or lid lag, no myxedema, no xanthelasma; normal ears, nose and oropharynx Neck: normal jugular venous pulsations and no hepatojugular reflux; brisk carotid pulses without delay and bilateral carotid bruits Chest: clear to auscultation, no signs of consolidation by percussion or palpation, normal fremitus, symmetrical and full respiratory excursions Cardiovascular: normal position and quality of the apical impulse, regular rhythm, normal first and second heart sounds, 3/6 aortic ejection murmur remains early peaking but  sounds more prominent than I remember, no diastolic murmurs, rubs or gallops Abdomen: no tenderness or distention, no masses by palpation, no abnormal pulsatility or arterial bruits, normal bowel sounds, no hepatosplenomegaly Extremities: no clubbing, cyanosis or edema; 2+ radial, ulnar and brachial pulses bilaterally; 2+ right femoral, posterior tibial and dorsalis pedis pulses; 2+ left femoral, posterior tibial and dorsalis pedis pulses; no subclavian or femoral bruits Neurological: grossly nonfocal Psych: Normal mood and affect     Wt Readings from Last 3 Encounters:  09/17/22 50.3 kg  02/20/22 47.2 kg  07/25/21 47.2 kg    Studies/Labs Reviewed:   ECHO 07/11/2020:   1. Left ventricular ejection fraction, by estimation, is 65 to 70%.  The  left ventricle has normal function. The left ventricle has no regional  wall motion abnormalities. Left ventricular diastolic parameters are  consistent with Grade I diastolic  dysfunction (impaired relaxation).   2. Right ventricular systolic function is moderately reduced. The right  ventricular size is normal. There is normal pulmonary artery systolic  pressure. The estimated right ventricular systolic pressure is 28.4 mmHg.   3. The mitral valve is normal in structure. No evidence of mitral valve  regurgitation. No evidence of mitral stenosis.   4. The aortic valve is tricuspid. Aortic valve regurgitation is mild.  Mild aortic valve stenosis. Aortic valve mean gradient measures 15.0 mmHg.   5. The inferior vena cava is normal in size with greater than 50%  respiratory variability, suggesting right atrial pressure of 3 mmHg.  Carotid Doppler 07/22/2022: Right Carotid: The extracranial vessels were near-normal with only minimal  wall                thickening or plaque.   Left Carotid: Velocities in the left ICA are consistent with a low end  range               stable 40-59% stenosis.   Vertebrals:  Bilateral vertebral arteries  demonstrate antegrade flow.  Subclavians: Normal flow hemodynamics were seen in bilateral subclavian               arteries.    EKG:  EKG is ordered today.  It shows normal sinus rhythm, QS pattern in leads V1 and V2, unchanged nonspecific T wave abnormality in leads V4-V6, QTc 433 ms.  Recent Labs: February 28, 2020  creatinine 1.2,  hemoglobin 15.3 normal liver function tests cholesterol 148, triglycerides 56, LDL 78, HDL 59.  03/18/2022 Cholesterol 113, HDL 44, LDL 49, triglycerides 101 Hemoglobin 13.4, creatinine 1.2, potassium 3.8, ALT 22, TSH 1.29  ASSESSMENT:    1. Nonrheumatic aortic valve stenosis   2. Carotid stenosis, asymptomatic, left   3. Essential hypertension   4. Pure hypercholesterolemia   5. Aortic atherosclerosis (HCC)   6. Smoking       PLAN:  In order of problems listed above:  AS: Mains asymptomatic despite being physically active.  Plan to recheck an echocardiogram next year.  Again reviewed the options of TAVR versus SAVR depending on whether or not she has coronary disease and/or aortic dilation and good peripheral access for stent valve. Carotid bruits: These have been very stable over the last 2 years.  I think we can decrease the frequency of Doppler ultrasounds. HTN: Well-controlled HLP: Lipid parameters are at target Aortic atherosclerosis: No symptoms of PAD, but atherosclerotic plaque is well documented on a variety of imaging studies where she is shown to have plaque in the aorta, iliac arteries, abdominal visceral arteries.  None of these have been asymptomatic to date. Raynaud's syndrome: No complaints in a long time. Smoking: Ideally she would quit smoking before she needs to undergo TAVR or SAVR.   Medication Adjustments/Labs and Tests Ordered: Current medicines are reviewed at length with the patient today.  Concerns regarding medicines are outlined above.  Medication changes, Labs and Tests ordered today are listed in the Patient  Instructions below. Patient Instructions  Medication Instructions:  The current medical regimen is effective;  continue present plan and medications.  *If you need a refill on your cardiac medications before your next appointment, please call your pharmacy*   Testing/Procedures:  Echocardiogram (12 months)  - Your physician has requested that you  have an echocardiogram. Echocardiography is a painless test that uses sound waves to create images of your heart. It provides your doctor with information about the size and shape of your heart and how well your heart's chambers and valves are working. This procedure takes approximately one hour. There are no restrictions for this procedure.     Follow-Up: At [facility], you and your health needs are our priority.  As part of our continuing mission to provide you with exceptional heart care, we have created designated Provider Care Teams.  These Care Teams include your primary Cardiologist (physician) and Advanced Practice Providers (APPs -  Physician Assistants and Nurse Practitioners) who all work together to provide you with the care you need, when you need it.  We recommend signing up for the patient portal called "MyChart".  Sign up information is provided on this After Visit Summary.  MyChart is used to connect with patients for Virtual Visits (Telemedicine).  Patients are able to view lab/test results, encounter notes, upcoming appointments, etc.  Non-urgent messages can be sent to your provider as well.   To learn more about what you can do with MyChart, go to [email or web address].    Your next appointment:   12 month(s)  The format for your next appointment:   In Person  Provider:   Dr.Yer Olivencia           Signed, Thurmon Fair, MD  09/20/2022 7:36 PM    [facility] [address] [place], [place], [place]  [address] Phone: [phone]; Fax: [phone]

## 2023-04-09 ENCOUNTER — Other Ambulatory Visit: Payer: Self-pay | Admitting: Internal Medicine

## 2023-04-09 DIAGNOSIS — Z720 Tobacco use: Secondary | ICD-10-CM

## 2023-04-14 ENCOUNTER — Other Ambulatory Visit: Payer: Self-pay | Admitting: Internal Medicine

## 2023-04-14 DIAGNOSIS — Z1231 Encounter for screening mammogram for malignant neoplasm of breast: Secondary | ICD-10-CM

## 2023-05-10 ENCOUNTER — Ambulatory Visit
Admission: RE | Admit: 2023-05-10 | Discharge: 2023-05-10 | Disposition: A | Discharge status: Home / Self Care | Payer: Medicare Other | Source: Ambulatory Visit | Attending: Internal Medicine | Admitting: Internal Medicine

## 2023-05-10 DIAGNOSIS — Z720 Tobacco use: Secondary | ICD-10-CM

## 2023-05-25 ENCOUNTER — Ambulatory Visit
Admission: RE | Admit: 2023-05-25 | Discharge: 2023-05-25 | Disposition: A | Discharge status: Home / Self Care | Payer: Medicare Other | Source: Ambulatory Visit | Attending: Internal Medicine | Admitting: Internal Medicine

## 2023-05-25 DIAGNOSIS — Z1231 Encounter for screening mammogram for malignant neoplasm of breast: Secondary | ICD-10-CM

## 2023-06-12 ENCOUNTER — Other Ambulatory Visit: Payer: Self-pay

## 2023-06-12 ENCOUNTER — Encounter (HOSPITAL_COMMUNITY): Payer: Self-pay

## 2023-06-12 ENCOUNTER — Emergency Department (HOSPITAL_COMMUNITY)
Admission: EM | Admit: 2023-06-12 | Discharge: 2023-06-13 | Disposition: A | Discharge status: Home / Self Care | Payer: Medicare Other | Attending: Emergency Medicine | Admitting: Emergency Medicine

## 2023-06-12 ENCOUNTER — Emergency Department (HOSPITAL_COMMUNITY): Payer: Medicare Other

## 2023-06-12 DIAGNOSIS — E876 Hypokalemia: Secondary | ICD-10-CM

## 2023-06-12 DIAGNOSIS — K047 Periapical abscess without sinus: Secondary | ICD-10-CM | POA: Diagnosis not present

## 2023-06-12 DIAGNOSIS — R42 Dizziness and giddiness: Secondary | ICD-10-CM | POA: Diagnosis not present

## 2023-06-12 DIAGNOSIS — R531 Weakness: Secondary | ICD-10-CM | POA: Diagnosis present

## 2023-06-12 LAB — URINALYSIS, ROUTINE W REFLEX MICROSCOPIC
Bilirubin Urine: NEGATIVE
Glucose, UA: NEGATIVE mg/dL
Hgb urine dipstick: NEGATIVE
Ketones, ur: 5 mg/dL — AB
Leukocytes,Ua: NEGATIVE
Nitrite: NEGATIVE
Protein, ur: NEGATIVE mg/dL
Specific Gravity, Urine: 1.003 — ABNORMAL LOW (ref 1.005–1.030)
pH: 6 (ref 5.0–8.0)

## 2023-06-12 LAB — CBC WITH DIFFERENTIAL/PLATELET
Abs Immature Granulocytes: 0.03 10*3/uL (ref 0.00–0.07)
Basophils Absolute: 0.1 10*3/uL (ref 0.0–0.1)
Basophils Relative: 1 %
Eosinophils Absolute: 0 10*3/uL (ref 0.0–0.5)
Eosinophils Relative: 0 %
HCT: 43.1 % (ref 36.0–46.0)
Hemoglobin: 15 g/dL (ref 12.0–15.0)
Immature Granulocytes: 0 %
Lymphocytes Relative: 9 %
Lymphs Abs: 0.8 10*3/uL (ref 0.7–4.0)
MCH: 29.4 pg (ref 26.0–34.0)
MCHC: 34.8 g/dL (ref 30.0–36.0)
MCV: 84.5 fL (ref 80.0–100.0)
Monocytes Absolute: 0.7 10*3/uL (ref 0.1–1.0)
Monocytes Relative: 7 %
Neutro Abs: 7.8 10*3/uL — ABNORMAL HIGH (ref 1.7–7.7)
Neutrophils Relative %: 83 %
Platelets: 204 10*3/uL (ref 150–400)
RBC: 5.1 MIL/uL (ref 3.87–5.11)
RDW: 11.7 % (ref 11.5–15.5)
WBC: 9.4 10*3/uL (ref 4.0–10.5)
nRBC: 0 % (ref 0.0–0.2)

## 2023-06-12 LAB — COMPREHENSIVE METABOLIC PANEL
ALT: 16 U/L (ref 0–44)
AST: 16 U/L (ref 15–41)
Albumin: 4.2 g/dL (ref 3.5–5.0)
Alkaline Phosphatase: 62 U/L (ref 38–126)
Anion gap: 13 (ref 5–15)
BUN: 22 mg/dL (ref 8–23)
CO2: 23 mmol/L (ref 22–32)
Calcium: 8.6 mg/dL — ABNORMAL LOW (ref 8.9–10.3)
Chloride: 91 mmol/L — ABNORMAL LOW (ref 98–111)
Creatinine, Ser: 1.09 mg/dL — ABNORMAL HIGH (ref 0.44–1.00)
GFR, Estimated: 51 mL/min — ABNORMAL LOW (ref >60)
Glucose, Bld: 123 mg/dL — ABNORMAL HIGH (ref 70–99)
Potassium: 2.7 mmol/L — CL (ref 3.5–5.1)
Sodium: 127 mmol/L — ABNORMAL LOW (ref 135–145)
Total Bilirubin: 1.3 mg/dL — ABNORMAL HIGH (ref 0.3–1.2)
Total Protein: 7.3 g/dL (ref 6.5–8.1)

## 2023-06-12 MED ORDER — SODIUM CHLORIDE 0.9 % IV BOLUS
1000.0000 mL | Freq: Once | INTRAVENOUS | Status: AC
  Administered 2023-06-12: 1000 mL via INTRAVENOUS

## 2023-06-12 MED ORDER — POTASSIUM CHLORIDE 10 MEQ/100ML IV SOLN
10.0000 meq | INTRAVENOUS | Status: AC
  Administered 2023-06-12 (×3): 10 meq via INTRAVENOUS
  Filled 2023-06-12 (×3): qty 100

## 2023-06-12 MED ORDER — IOHEXOL 300 MG/ML  SOLN
100.0000 mL | Freq: Once | INTRAMUSCULAR | Status: AC | PRN
  Administered 2023-06-12: 100 mL via INTRAVENOUS

## 2023-06-12 MED ORDER — POTASSIUM CHLORIDE CRYS ER 20 MEQ PO TBCR
40.0000 meq | EXTENDED_RELEASE_TABLET | Freq: Once | ORAL | Status: AC
  Administered 2023-06-12: 40 meq via ORAL
  Filled 2023-06-12: qty 2

## 2023-06-12 MED ORDER — POTASSIUM CHLORIDE CRYS ER 20 MEQ PO TBCR: 20.0000 meq | EXTENDED_RELEASE_TABLET | Freq: Every day | ORAL | 0 refills | Status: DC

## 2023-06-12 NOTE — ED Notes (Signed)
Assisted pt to the restroom via wheelchair.

## 2023-06-12 NOTE — ED Notes (Addendum)
Pt having some tenderness and irritation to RAC IV site. No signs of infiltration noted. IV removed and another 22g IV placed for last bag of potassium admin

## 2023-06-12 NOTE — Discharge Instructions (Signed)
Your potassium was low.  I have prescribed potassium 20 mg daily for a week.  You need to have repeat potassium level with your doctor   You should take the omnicef as prescribed by urgent care   See your oral surgeon for follow up   Return to ER if you have worse dizziness, weakness, trouble swallowing, dehydration

## 2023-06-12 NOTE — ED Triage Notes (Signed)
Pt referred to the ER by [facility], seen there for same complaints and an infected tooth. Pt is waiting for oral surgeon. Pt sent here for further evaluation of the dizziness, and generalized weakness that has been going on for apprx 2 days. Pt states that she believes she is dehydrated.

## 2023-06-12 NOTE — ED Provider Notes (Signed)
[place] EMERGENCY DEPARTMENT AT [facility] Provider Note   CSN: [id number] Arrival date & time: 06/12/23  1731     History  Chief Complaint  Patient presents with   Weakness   Dizziness    Gina Harrell is a 82 y.o. female hx of HTN, here presenting with weakness and dizziness and dental pain.  Patient had her teeth cleaned by her dentist 5 days ago.  Patient was noted to have a cavity so she was put on amoxicillin.  She then saw her oral surgeon.  She was told that the tooth needs to be extracted.  Patient had a fever and saw primary care doctor.  Patient was told to finish amoxicillin.  She finished amoxicillin yesterday.  She still has persistent pain and dizziness so went to urgent care today.  Urgent care gave her a dose of IV Rocephin.  They felt that she may be dehydrated and sent here for further evaluation.  Patient denies any fever since Thursday.  Patient states that she just feels lightheaded and dizzy.  She states that she has poor appetite.  The history is provided by the patient.       Home Medications Prior to Admission medications   Medication Sig Start Date End Date Taking? Authorizing Provider  ALPRAZolam Prudy Feeler) 0.5 MG tablet Take 0.5 mg by mouth at bedtime.     [provider]  Cholecalciferol (VITAMIN D-3) 1000 UNITS CAPS Take 1 capsule by mouth daily. Reported on 12/26/2015    [provider]  CRANBERRY PO Take 2 capsules by mouth daily.    [provider]  diphenhydramine-acetaminophen (TYLENOL PM) 25-500 MG TABS Take 1 tablet by mouth at bedtime. Reported on 12/26/2015    [provider]  escitalopram (LEXAPRO) 10 MG tablet Take 10 mg by mouth daily. 08/02/22   [provider]  ezetimibe (ZETIA) 10 MG tablet Take 1 tablet (10 mg total) by mouth daily. 07/25/21   Croitoru, Mihai, MD  levothyroxine (SYNTHROID, LEVOTHROID) 50 MCG tablet Take 50 mcg by mouth daily before breakfast. Reported on 12/26/2015     [provider]  rosuvastatin (CRESTOR) 20 MG tablet Take 20 mg by mouth daily.    [provider]  triamterene-hydrochlorothiazide (MAXZIDE-25) 37.5-25 MG tablet Take 1 tablet by mouth every other day.  12/10/15   [provider]      Allergies    Patient has no known allergies.    Review of Systems   Review of Systems  Neurological:  Positive for dizziness and weakness.  All other systems reviewed and are negative.   Physical Exam Updated Vital Signs BP 133/84 (BP Location: Left Arm)   Pulse 70   Temp 98.1 F (36.7 C) (Oral)   Resp 16   Ht 4\' 11"  (1.499 m)   Wt 47.6 kg   SpO2 96%   BMI 21.21 kg/m  Physical Exam Vitals and nursing note reviewed.  Constitutional:      Appearance: Normal appearance.  HENT:     Head: Normocephalic.     Mouth/Throat:     Comments: Patient has large cavity of the right upper premolar tooth.  There is seem to be some swelling in the periapical area.  Patient does have tenderness of the right maxillary sinus. Neck:     Comments: No obvious cervical lymphadenopathy Cardiovascular:     Rate and Rhythm: Normal rate and regular rhythm.     Pulses: Normal pulses.  Pulmonary:  Effort: Pulmonary effort is normal.     Breath sounds: Normal breath sounds.  Abdominal:     General: Abdomen is flat.     Palpations: Abdomen is soft.  Musculoskeletal:     Cervical back: Normal range of motion.  Skin:    General: Skin is warm.  Neurological:     General: No focal deficit present.     Mental Status: She is alert and oriented to person, place, and time.  Psychiatric:        Mood and Affect: Mood normal.        Behavior: Behavior normal.     ED Results / Procedures / Treatments   Labs (all labs ordered are listed, but only abnormal results are displayed) Labs Reviewed  CBC WITH DIFFERENTIAL/PLATELET  COMPREHENSIVE METABOLIC PANEL  URINALYSIS, ROUTINE W REFLEX MICROSCOPIC    EKG None  Radiology No results  found.  Procedures Procedures    Medications Ordered in ED Medications  sodium chloride 0.9 % bolus 1,000 mL (1,000 mLs Intravenous New Bag/Given 06/12/23 1832)    ED Course/ Medical Decision Making/ A&P                                 Medical Decision Making Gina Harrell is a 82 y.o. female here presenting with dental pain and dizziness and headaches.  Patient just finished a course of amoxicillin for possible dental infection.  Patient went to urgent care and is prescribed Omnicef and given a dose of Rocephin.  I am concerned for possible abscess that extends into the sinus or brain.  Plan to get CT brain and maxillofacial with and without contrast.  Will also get CBC and CMP as well.  9:50 PM I reviewed patient's labs and patient's white blood cell count is normal.  However her potassium is 2.7 and was supplemented.  Patient CT did not show any drainable abscess.  I think her symptoms likely secondary to hypokalemia.  She states that she has poor p.o. intake since she has the dental infection.  I will prescribe potassium and also encouraged her to fill the [facility] prescription and follow-up with her dentist.  She will need repeat potassium level with PCP in a week.   Problems Addressed: Dental infection: acute illness or injury Hypokalemia: acute illness or injury  Amount and/or Complexity of Data Reviewed Labs: ordered. Decision-making details documented in ED Course. Radiology: ordered and independent interpretation performed. Decision-making details documented in ED Course.  Risk Prescription drug management.    Final Clinical Impression(s) / ED Diagnoses Final diagnoses:  None    Rx / DC Orders ED Discharge Orders     None         Charlynne Pander, MD 06/12/23 2151

## 2023-06-13 NOTE — ED Notes (Signed)
Discharge papers reviewed with pt. Pt left ED via wheelchair

## 2023-07-23 ENCOUNTER — Other Ambulatory Visit (HOSPITAL_COMMUNITY): Payer: Self-pay | Admitting: Cardiovascular Disease

## 2023-07-23 DIAGNOSIS — I6523 Occlusion and stenosis of bilateral carotid arteries: Secondary | ICD-10-CM

## 2023-07-26 ENCOUNTER — Ambulatory Visit (HOSPITAL_COMMUNITY)
Admission: RE | Admit: 2023-07-26 | Discharge: 2023-07-26 | Disposition: A | Discharge status: Home / Self Care | Payer: Medicare Other | Source: Ambulatory Visit | Attending: Cardiology | Admitting: Cardiology

## 2023-07-26 DIAGNOSIS — I6523 Occlusion and stenosis of bilateral carotid arteries: Secondary | ICD-10-CM | POA: Diagnosis present

## 2023-08-18 ENCOUNTER — Inpatient Hospital Stay (HOSPITAL_COMMUNITY)
Admission: EM | Admit: 2023-08-18 | Discharge: 2023-08-20 | DRG: 690 | Disposition: A | Discharge status: Home / Self Care | Payer: Medicare Other | Attending: Internal Medicine | Admitting: Internal Medicine

## 2023-08-18 ENCOUNTER — Encounter (HOSPITAL_COMMUNITY): Payer: Self-pay

## 2023-08-18 ENCOUNTER — Other Ambulatory Visit: Payer: Self-pay

## 2023-08-18 ENCOUNTER — Emergency Department (HOSPITAL_COMMUNITY): Payer: Medicare Other

## 2023-08-18 ENCOUNTER — Ambulatory Visit (HOSPITAL_BASED_OUTPATIENT_CLINIC_OR_DEPARTMENT_OTHER): Payer: Medicare Other

## 2023-08-18 DIAGNOSIS — E871 Hypo-osmolality and hyponatremia: Secondary | ICD-10-CM | POA: Diagnosis not present

## 2023-08-18 DIAGNOSIS — N1330 Unspecified hydronephrosis: Secondary | ICD-10-CM | POA: Diagnosis present

## 2023-08-18 DIAGNOSIS — E876 Hypokalemia: Secondary | ICD-10-CM | POA: Diagnosis not present

## 2023-08-18 DIAGNOSIS — E039 Hypothyroidism, unspecified: Secondary | ICD-10-CM | POA: Diagnosis present

## 2023-08-18 DIAGNOSIS — Z79890 Hormone replacement therapy (postmenopausal): Secondary | ICD-10-CM

## 2023-08-18 DIAGNOSIS — F17210 Nicotine dependence, cigarettes, uncomplicated: Secondary | ICD-10-CM | POA: Diagnosis present

## 2023-08-18 DIAGNOSIS — N390 Urinary tract infection, site not specified: Secondary | ICD-10-CM | POA: Diagnosis present

## 2023-08-18 DIAGNOSIS — Z8249 Family history of ischemic heart disease and other diseases of the circulatory system: Secondary | ICD-10-CM

## 2023-08-18 DIAGNOSIS — I350 Nonrheumatic aortic (valve) stenosis: Secondary | ICD-10-CM

## 2023-08-18 DIAGNOSIS — Z720 Tobacco use: Secondary | ICD-10-CM

## 2023-08-18 DIAGNOSIS — R310 Gross hematuria: Secondary | ICD-10-CM | POA: Diagnosis present

## 2023-08-18 DIAGNOSIS — N136 Pyonephrosis: Principal | ICD-10-CM | POA: Diagnosis present

## 2023-08-18 DIAGNOSIS — Z833 Family history of diabetes mellitus: Secondary | ICD-10-CM

## 2023-08-18 DIAGNOSIS — Z79899 Other long term (current) drug therapy: Secondary | ICD-10-CM

## 2023-08-18 DIAGNOSIS — E7800 Pure hypercholesterolemia, unspecified: Secondary | ICD-10-CM | POA: Diagnosis present

## 2023-08-18 DIAGNOSIS — N12 Tubulo-interstitial nephritis, not specified as acute or chronic: Secondary | ICD-10-CM | POA: Diagnosis not present

## 2023-08-18 DIAGNOSIS — Z800 Family history of malignant neoplasm of digestive organs: Secondary | ICD-10-CM

## 2023-08-18 DIAGNOSIS — I10 Essential (primary) hypertension: Secondary | ICD-10-CM | POA: Diagnosis present

## 2023-08-18 DIAGNOSIS — M810 Age-related osteoporosis without current pathological fracture: Secondary | ICD-10-CM | POA: Diagnosis present

## 2023-08-18 DIAGNOSIS — K219 Gastro-esophageal reflux disease without esophagitis: Secondary | ICD-10-CM | POA: Diagnosis present

## 2023-08-18 DIAGNOSIS — Z8551 Personal history of malignant neoplasm of bladder: Secondary | ICD-10-CM

## 2023-08-18 DIAGNOSIS — A419 Sepsis, unspecified organism: Secondary | ICD-10-CM

## 2023-08-18 DIAGNOSIS — Z803 Family history of malignant neoplasm of breast: Secondary | ICD-10-CM

## 2023-08-18 LAB — URINALYSIS, ROUTINE W REFLEX MICROSCOPIC
Bilirubin Urine: NEGATIVE
Glucose, UA: NEGATIVE mg/dL
Ketones, ur: NEGATIVE mg/dL
Nitrite: NEGATIVE
Protein, ur: NEGATIVE mg/dL
Specific Gravity, Urine: 1.004 — ABNORMAL LOW (ref 1.005–1.030)
WBC, UA: >50 WBC/hpf (ref 0–5)
pH: 5 (ref 5.0–8.0)

## 2023-08-18 LAB — ECHOCARDIOGRAM COMPLETE
AR max vel: 0.71 cm2
AV Area VTI: 0.73 cm2
AV Area mean vel: 0.71 cm2
AV Mean grad: 19 mm[Hg]
AV Peak grad: 32.7 mm[Hg]
Ao pk vel: 2.86 m/s
Area-P 1/2: 3.13 cm2
P 1/2 time: 559 ms
S' Lateral: 2.1 cm

## 2023-08-18 LAB — CBC WITH DIFFERENTIAL/PLATELET
Abs Immature Granulocytes: 0.06 10*3/uL (ref 0.00–0.07)
Basophils Absolute: 0.1 10*3/uL (ref 0.0–0.1)
Basophils Relative: 0 %
Eosinophils Absolute: 0 10*3/uL (ref 0.0–0.5)
Eosinophils Relative: 0 %
HCT: 45.1 % (ref 36.0–46.0)
Hemoglobin: 15 g/dL (ref 12.0–15.0)
Immature Granulocytes: 0 %
Lymphocytes Relative: 7 %
Lymphs Abs: 1.1 10*3/uL (ref 0.7–4.0)
MCH: 30.2 pg (ref 26.0–34.0)
MCHC: 33.3 g/dL (ref 30.0–36.0)
MCV: 90.9 fL (ref 80.0–100.0)
Monocytes Absolute: 1.3 10*3/uL — ABNORMAL HIGH (ref 0.1–1.0)
Monocytes Relative: 8 %
Neutro Abs: 13.3 10*3/uL — ABNORMAL HIGH (ref 1.7–7.7)
Neutrophils Relative %: 85 %
Platelets: 179 10*3/uL (ref 150–400)
RBC: 4.96 MIL/uL (ref 3.87–5.11)
RDW: 12.7 % (ref 11.5–15.5)
WBC: 15.8 10*3/uL — ABNORMAL HIGH (ref 4.0–10.5)
nRBC: 0 % (ref 0.0–0.2)

## 2023-08-18 LAB — COMPREHENSIVE METABOLIC PANEL
ALT: 18 U/L (ref 0–44)
AST: 20 U/L (ref 15–41)
Albumin: 4.5 g/dL (ref 3.5–5.0)
Alkaline Phosphatase: 78 U/L (ref 38–126)
Anion gap: 12 (ref 5–15)
BUN: 18 mg/dL (ref 8–23)
CO2: 22 mmol/L (ref 22–32)
Calcium: 9.2 mg/dL (ref 8.9–10.3)
Chloride: 97 mmol/L — ABNORMAL LOW (ref 98–111)
Creatinine, Ser: 0.98 mg/dL (ref 0.44–1.00)
GFR, Estimated: 58 mL/min — ABNORMAL LOW (ref >60)
Glucose, Bld: 126 mg/dL — ABNORMAL HIGH (ref 70–99)
Potassium: 3.5 mmol/L (ref 3.5–5.1)
Sodium: 131 mmol/L — ABNORMAL LOW (ref 135–145)
Total Bilirubin: 2.1 mg/dL — ABNORMAL HIGH (ref 0.3–1.2)
Total Protein: 8 g/dL (ref 6.5–8.1)

## 2023-08-18 MED ORDER — LACTATED RINGERS IV BOLUS
1000.0000 mL | Freq: Once | INTRAVENOUS | Status: AC
  Administered 2023-08-18: 1000 mL via INTRAVENOUS

## 2023-08-18 MED ORDER — SODIUM CHLORIDE 0.9 % IV SOLN
1.0000 g | Freq: Once | INTRAVENOUS | Status: AC
  Administered 2023-08-18: 1 g via INTRAVENOUS
  Filled 2023-08-18: qty 10

## 2023-08-18 MED ORDER — FENTANYL CITRATE PF 50 MCG/ML IJ SOSY
50.0000 ug | PREFILLED_SYRINGE | Freq: Once | INTRAMUSCULAR | Status: AC
  Administered 2023-08-18: 50 ug via INTRAVENOUS
  Filled 2023-08-18: qty 1

## 2023-08-18 NOTE — ED Triage Notes (Signed)
Pt seen at [facility] today for hematuria. Pt reports gross bright red blood. Left flank pain. UC did a ct scan and they were concerned for an obstruction between kidney and bladder. They told pt to come to ER immediately. Denies N/V/D.

## 2023-08-18 NOTE — ED Notes (Signed)
ED TO INPATIENT HANDOFF REPORT  ED Nurse Name and Phone #: Jeanmarie Hubert EMT-P   S Name/Age/Gender Gina Harrell 82 y.o. female Room/Bed: WA24/WA24  Code Status   Code Status: Not on file  Home/SNF/Other Home Patient oriented to: self, place, time, and situation Is this baseline? Yes   Triage Complete: Triage complete  Chief Complaint Pyelonephritis [N12]  Triage Note Pt seen at [facility] today for hematuria. Pt reports gross bright red blood. Left flank pain. UC did a ct scan and they were concerned for an obstruction between kidney and bladder. They told pt to come to ER immediately. Denies N/V/D.    Allergies No Known Allergies  Level of Care/Admitting Diagnosis ED Disposition     ED Disposition  Admit   Condition  --   Comment  Hospital Area: [facility] [[id number]]  Level of Care: Med-Surg [16]  May place patient in observation at [facility] or [facility] if equivalent level of care is available:: No  Covid Evaluation: Asymptomatic - no recent exposure (last 10 days) testing not required  Diagnosis: Pyelonephritis [528413]  Admitting Physician: Hillary Bow [[id number]]  Attending Physician: Hillary Bow [[id number]]          B Medical/Surgery History Past Medical History:  Diagnosis Date   Cancer (HCC)    Bladder cancer   Elevated cholesterol    GERD (gastroesophageal reflux disease)    Heart murmur    Heart murmur    Hypertension    Hypothyroidism    Osteoporosis    Uterine prolapse    Past Surgical History:  Procedure Laterality Date   BLADDER TUMOR EXCISION     BREAST EXCISIONAL BIOPSY Right    CAROTID DOPPLER  2013   COLONOSCOPY WITH PROPOFOL N/A 02/17/2016   Procedure: COLONOSCOPY WITH PROPOFOL;  Surgeon: Charolett Bumpers, MD;  Location: [facility];  Service: Endoscopy;  Laterality: N/A;   DOPPLER ECHOCARDIOGRAPHY  2013   ESOPHAGOGASTRODUODENOSCOPY (EGD) WITH PROPOFOL N/A 02/17/2016   Procedure:  ESOPHAGOGASTRODUODENOSCOPY (EGD) WITH PROPOFOL;  Surgeon: Charolett Bumpers, MD;  Location: [facility];  Service: Endoscopy;  Laterality: N/A;   VAGINAL HYSTERECTOMY       A IV Location/Drains/Wounds Patient Lines/Drains/Airways Status     Active Line/Drains/Airways     Name Placement date Placement time Site Days   Peripheral IV 08/18/23 20 G 1" Anterior;Right Forearm 08/18/23  2155  Forearm  less than 1   Airway 02/17/16  1136  -- 2739            Intake/Output Last 24 hours No intake or output data in the 24 hours ending 08/18/23 2344  Labs/Imaging Results for orders placed or performed during the hospital encounter of 08/18/23 (from the past 48 hour(s))  CBC with Differential     Status: Abnormal   Collection Time: 08/18/23  6:42 PM  Result Value Ref Range   WBC 15.8 (H) 4.0 - 10.5 K/uL   RBC 4.96 3.87 - 5.11 MIL/uL   Hemoglobin 15.0 12.0 - 15.0 g/dL   HCT 10.2 72.5 - 36.6 %   MCV 90.9 80.0 - 100.0 fL   MCH 30.2 26.0 - 34.0 pg   MCHC 33.3 30.0 - 36.0 g/dL   RDW 44.0 34.7 - 42.5 %   Platelets 179 150 - 400 K/uL   nRBC 0.0 0.0 - 0.2 %   Neutrophils Relative % 85 %   Neutro Abs 13.3 (H) 1.7 - 7.7 K/uL   Lymphocytes Relative 7 %  Lymphs Abs 1.1 0.7 - 4.0 K/uL   Monocytes Relative 8 %   Monocytes Absolute 1.3 (H) 0.1 - 1.0 K/uL   Eosinophils Relative 0 %   Eosinophils Absolute 0.0 0.0 - 0.5 K/uL   Basophils Relative 0 %   Basophils Absolute 0.1 0.0 - 0.1 K/uL   Immature Granulocytes 0 %   Abs Immature Granulocytes 0.06 0.00 - 0.07 K/uL    Comment: Performed at [facility], [address] [place], [place] [address]  Comprehensive metabolic panel     Status: Abnormal   Collection Time: 08/18/23  6:42 PM  Result Value Ref Range   Sodium 131 (L) 135 - 145 mmol/L   Potassium 3.5 3.5 - 5.1 mmol/L   Chloride 97 (L) 98 - 111 mmol/L   CO2 22 22 - 32 mmol/L   Glucose, Bld 126 (H) 70 - 99 mg/dL    Comment: Glucose reference range applies only to  samples taken after fasting for at least 8 hours.   BUN 18 8 - 23 mg/dL   Creatinine, Ser 7.06 0.44 - 1.00 mg/dL   Calcium 9.2 8.9 - 23.7 mg/dL   Total Protein 8.0 6.5 - 8.1 g/dL   Albumin 4.5 3.5 - 5.0 g/dL   AST 20 15 - 41 U/L   ALT 18 0 - 44 U/L   Alkaline Phosphatase 78 38 - 126 U/L   Total Bilirubin 2.1 (H) 0.3 - 1.2 mg/dL   GFR, Estimated 58 (L) >60 mL/min    Comment: (NOTE) Calculated using the CKD-EPI Creatinine Equation (2021)    Anion gap 12 5 - 15    Comment: Performed at [facility], [address] [place], [place] [address]  Urinalysis, Routine w reflex microscopic -Urine, Clean Catch     Status: Abnormal   Collection Time: 08/18/23  7:58 PM  Result Value Ref Range   Color, Urine YELLOW YELLOW   APPearance HAZY (A) CLEAR   Specific Gravity, Urine 1.004 (L) 1.005 - 1.030   pH 5.0 5.0 - 8.0   Glucose, UA NEGATIVE NEGATIVE mg/dL   Hgb urine dipstick LARGE (A) NEGATIVE   Bilirubin Urine NEGATIVE NEGATIVE   Ketones, ur NEGATIVE NEGATIVE mg/dL   Protein, ur NEGATIVE NEGATIVE mg/dL   Nitrite NEGATIVE NEGATIVE   Leukocytes,Ua LARGE (A) NEGATIVE   RBC / HPF 21-50 0 - 5 RBC/hpf   WBC, UA >50 0 - 5 WBC/hpf   Bacteria, UA RARE (A) NONE SEEN   Squamous Epithelial / HPF 0-5 0 - 5 /HPF   WBC Clumps PRESENT    Mucus PRESENT     Comment: Performed at [facility], [address] [place], [place] [address]   CT Renal Stone Study  Result Date: 08/18/2023 CLINICAL DATA:  Abdominal/flank pain, stone suspected left flank, hematuria. Technologist notes state: Patient states she was referred from urgent care where she had a CT scan. EXAM: CT ABDOMEN AND PELVIS WITHOUT CONTRAST TECHNIQUE: Multidetector CT imaging of the abdomen and pelvis was performed following the standard protocol without IV contrast. RADIATION DOSE REDUCTION: This exam was performed according to the departmental dose-optimization program which includes automated exposure  control, adjustment of the mA and/or kV according to patient size and/or use of iterative reconstruction technique. COMPARISON:  CT 09/15/2021 FINDINGS: Lower chest: Clear lung bases.  Coronary artery calcifications Hepatobiliary: Chronic calcified granuloma anterior to the left hepatic lobe. Unenhanced liver otherwise unremarkable. Gallbladder physiologically distended, no calcified stone. No biliary dilatation. Pancreas:  No ductal dilatation or inflammation. Spleen: Normal in size without focal abnormality. Adrenals/Urinary Tract: No adrenal nodule. Left hydroureteronephrosis is in part chronic, but progressed from prior exam. Left ureteral dilatation is also chronic, but mildly progressed. Low insertion of the ureter in the bladder in the low pelvis. No renal ureteral calculi. There may be some soft tissue thickening in the region of the ureteral insertion of the bladder, although not well assessed on this unenhanced exam. Insert prominence of the right renal collecting system without hydronephrosis. No right hydroureter. The small hypodense lesions in the right kidney on prior grossly stable, but not as well delineated on the current exam in the absence of contrast. No further follow-up imaging is recommended. Bladder slightly deviated into the right pelvis, low lying. No bladder stone or bladder inflammation. Stomach/Bowel: The stomach is nondistended. Small posterior gastric diverticulum without inflammation. 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 Small bowel is otherwise unremarkable without obstruction or inflammation. The appendix is not well-defined on the current exam. No evidence of appendicitis. Small to moderate volume of colonic stool. No colonic inflammation. Vascular/Lymphatic: Aortic and branch atherosclerosis. No aneurysm. No bulky abdominopelvic adenopathy. Reproductive: Status post hysterectomy. No adnexal masses. Other: No ascites.  Small fat containing bilateral inguinal hernias.  Musculoskeletal: Scoliosis and degenerative change in the spine. There are no acute or suspicious osseous abnormalities. IMPRESSION: 1. Left hydroureteronephrosis is in part chronic, but progressed from November 2023 exam. Left ureteral dilatation is also chronic, but mildly progressed. The left ureteral insertion is low within the bladder. There may be some soft tissue thickening in the region of the ureteral insertion, not well assessed on this unenhanced exam. Recommend urologic follow-up. 2. No urolithiasis. Aortic Atherosclerosis (ICD10-I70.0). Electronically Signed   By: Narda Rutherford M.D.   On: 08/18/2023 21:35   ECHOCARDIOGRAM COMPLETE  Result Date: 08/18/2023    ECHOCARDIOGRAM REPORT   Patient Name:   Gina Harrell Date of Exam: 08/18/2023 Medical Rec #:  [id number]         Height:       59.0 in Accession #:    [id number]        Weight:       105.0 lb Date of Birth:  12-21-1940        BSA:          1.403 m Patient Age:    81 years          BP:           159/78 mmHg Patient Gender: F                 HR:           71 bpm. 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 Risk Factors:Hypertension and HLD.  Sonographer:    Clearence Ped RCS Referring Phys: [phone] MIHAI CROITORU IMPRESSIONS  1. Left ventricular ejection fraction, by estimation, is 65 to 70%. The left ventricle has normal function. The left ventricle has no regional wall motion abnormalities. There is moderate asymmetric left ventricular hypertrophy of the septal segment. Left ventricular diastolic parameters are consistent with Grade I diastolic dysfunction (impaired relaxation). The average left ventricular global longitudinal strain  is -21.9 %. The global longitudinal strain is normal.  2. Right ventricular  systolic function is normal. The right ventricular size is normal. Tricuspid regurgitation signal is inadequate for assessing PA pressure.  3. The mitral valve is grossly normal. Trivial mitral valve regurgitation. No evidence of mitral stenosis.  4. The aortic valve is abnormal. There is moderate calcification of the aortic valve. Aortic valve regurgitation is mild. Mild to moderate aortic valve stenosis. Aortic valve area, by VTI measures 0.73 cm. Aortic valve mean gradient measures 19.0 mmHg.  Aortic valve Vmax measures 2.86 m/s.  5. The inferior vena cava is normal in size with greater than 50% respiratory variability, suggesting right atrial pressure of 3 mmHg.  6. Cannot exclude a small PFO. FINDINGS  Left Ventricle: Left ventricular ejection fraction, by estimation, is 65 to 70%. The left ventricle has normal function. The left ventricle has no regional wall motion abnormalities. The average left ventricular global longitudinal strain is -21.9 %. The global longitudinal strain is normal. 3D ejection fraction reviewed and evaluated as part of the interpretation. Alternate measurement of EF is felt to be most reflective of LV function. The left ventricular internal cavity size was normal in size. There is moderate asymmetric left ventricular hypertrophy of the septal segment. Left ventricular diastolic parameters are consistent with Grade I diastolic dysfunction (impaired relaxation). Right Ventricle: The right ventricular size is normal. No increase in right ventricular wall thickness. Right ventricular systolic function is normal. Tricuspid regurgitation signal is inadequate for assessing PA pressure. Left [facility]: Left atrial size was normal in size. Right [facility]: Right atrial size was normal in size. Pericardium: Trivial pericardial effusion is present. Mitral Valve: The mitral valve is grossly normal. Trivial mitral valve regurgitation. No evidence of mitral valve stenosis. Tricuspid Valve: The tricuspid  valve is normal in structure. Tricuspid valve regurgitation is trivial. No evidence of tricuspid stenosis. Aortic Valve: The aortic valve is abnormal. There is moderate calcification of the aortic valve. Aortic valve regurgitation is mild. Aortic regurgitation PHT measures 559 msec. Mild to moderate aortic stenosis is present. Aortic valve mean gradient measures 19.0 mmHg. Aortic valve peak gradient measures 32.7 mmHg. Aortic valve area, by VTI measures 0.73 cm. Pulmonic Valve: The pulmonic valve was normal in structure. Pulmonic valve regurgitation is trivial. No evidence of pulmonic stenosis. Aorta: The aortic root is normal in size and structure. Venous: The inferior vena cava is normal in size with greater than 50% respiratory variability, suggesting right atrial pressure of 3 mmHg. IAS/Shunts: Cannot exclude a small PFO.  LEFT VENTRICLE PLAX 2D LVIDd:         3.40 cm   Diastology LVIDs:         2.10 cm   LV e' medial:    6.31 cm/s LV PW:         1.00 cm   LV E/e' medial:  8.6 LV IVS:        1.30 cm   LV e' lateral:   9.68 cm/s LVOT diam:     1.70 cm   LV E/e' lateral: 5.6 LV SV:         45 LV SV Index:   32        2D Longitudinal Strain LVOT Area:     2.27 cm  2D Strain GLS Avg:     -21.9 %  3D Volume EF:                          3D EF:        57 %                          LV EDV:       64 ml                          LV ESV:       27 ml                          LV SV:        36 ml RIGHT VENTRICLE RV Basal diam:  2.40 cm RV S prime:     17.20 cm/s TAPSE (M-mode): 1.5 cm LEFT ATRIUM             Index        RIGHT ATRIUM          Index LA diam:        2.90 cm 2.07 cm/m   RA Area:     8.36 cm LA Vol (A2C):   29.5 ml 21.03 ml/m  RA Volume:   13.30 ml 9.48 ml/m LA Vol (A4C):   23.3 ml 16.61 ml/m LA Biplane Vol: 26.9 ml 19.18 ml/m  AORTIC VALVE AV Area (Vmax):    0.71 cm AV Area (Vmean):   0.71 cm AV Area (VTI):     0.73 cm AV Vmax:           286.00 cm/s AV Vmean:          206.000  cm/s AV VTI:            0.625 m AV Peak Grad:      32.7 mmHg AV Mean Grad:      19.0 mmHg LVOT Vmax:         89.60 cm/s LVOT Vmean:        64.500 cm/s LVOT VTI:          0.200 m LVOT/AV VTI ratio: 0.32 AI PHT:            559 msec  AORTA Ao Root diam: 2.70 cm Ao Asc diam:  2.90 cm MITRAL VALVE MV Area (PHT):             SHUNTS MV Decel Time:             Systemic VTI:  0.20 m MV E velocity: 54.00 cm/s  Systemic Diam: 1.70 cm MV A velocity: 80.70 cm/s MV E/A ratio:  0.67 Weston Brass MD Electronically signed by Weston Brass MD Signature Date/Time: 08/18/2023/12:38:59 PM    Final     Pending Labs Unresulted Labs (From admission, onward)    None       Vitals/Pain Today's Vitals   08/18/23 2230 08/18/23 2300 08/18/23 2330 08/18/23 2339  BP: 128/65 (!) 142/65 (!) 142/64   Pulse: 73 80 72   Resp:  17    Temp:    99.1 F (37.3 C)  TempSrc:    Oral  SpO2: 96% 98% 97%   Weight:      Height:      PainSc:        Isolation Precautions No active isolations  Medications Medications  fentaNYL (SUBLIMAZE) injection 50 mcg (  50 mcg Intravenous Given 08/18/23 2157)  cefTRIAXone (ROCEPHIN) 1 g in sodium chloride 0.9 % 100 mL IVPB (0 g Intravenous Stopped 08/18/23 2230)  lactated ringers bolus 1,000 mL (1,000 mLs Intravenous New Bag/Given 08/18/23 2233)    Mobility walks     Focused Assessments See Chart    R Recommendations: See Admitting Provider Note  Report given to: Eilleen Kempf RN

## 2023-08-18 NOTE — ED Provider Triage Note (Signed)
Emergency Medicine Provider Triage Evaluation Note  Gina Harrell , a 82 y.o. female  was evaluated in triage.  Pt states she was sent over here by [facility] because there was concern for a blockage between her bladder and her kidney.  She had a CT abdomen pelvis earlier today and was contacted by the provider after this was performed regarding this result.  She reports left flank pain and hematuria going on for the past day.  Denies any nausea vomiting or diarrhea.  Last bowel movement was yesterday.  Review of Systems  Positive: As above Negative: As above  Physical Exam  BP (!) 155/78   Pulse 86   Temp 99.5 F (37.5 C) (Oral)   Resp 18   Ht 4\' 11"  (1.499 m)   Wt 47 kg   SpO2 97%   BMI 20.93 kg/m  Gen:   Awake, no distress   Resp:  Normal effort  MSK:   Moves extremities without difficulty  Other:  Abdomen soft nontender to palpation  Medical Decision Making  Medically screening exam initiated at 6:11 PM.  Appropriate orders placed.  Gina Harrell was informed that the remainder of the evaluation will be completed by another provider, this initial triage assessment does not replace that evaluation, and the importance of remaining in the ED until their evaluation is complete.     Arabella Merles, PA-C 08/18/23 1812

## 2023-08-18 NOTE — ED Notes (Signed)
Pt now back from CT.

## 2023-08-18 NOTE — ED Provider Notes (Signed)
[place] [facility]ERGENCY DEPARTMENT AT [facility] Provider Note   CSN: [id number] Arrival date & time: 08/18/23  1703     History {Add pertinent medical, surgical, social history, OB history to HPI:1} Chief Complaint  Patient presents with   Hematuria    Gina Harrell is a 82 y.o. female.   Hematuria       Home Medications Prior to Admission medications   Medication Sig Start Date End Date Taking? Authorizing Provider  ALPRAZolam Prudy Feeler) 0.5 MG tablet Take 0.5 mg by mouth at bedtime.     [provider]  Cholecalciferol (VITAMIN D-3) 1000 UNITS CAPS Take 1 capsule by mouth daily. Reported on 12/26/2015    [provider]  CRANBERRY PO Take 2 capsules by mouth daily.    [provider]  diphenhydramine-acetaminophen (TYLENOL PM) 25-500 MG TABS Take 1 tablet by mouth at bedtime. Reported on 12/26/2015    [provider]  escitalopram (LEXAPRO) 10 MG tablet Take 10 mg by mouth daily. 08/02/22   [provider]  ezetimibe (ZETIA) 10 MG tablet Take 1 tablet (10 mg total) by mouth daily. 07/25/21   Croitoru, Mihai, MD  levothyroxine (SYNTHROID, LEVOTHROID) 50 MCG tablet Take 50 mcg by mouth daily before breakfast. Reported on 12/26/2015    [provider]  potassium chloride SA (KLOR-CON M) 20 MEQ tablet Take 1 tablet (20 mEq total) by mouth daily. 06/12/23   Charlynne Pander, MD  rosuvastatin (CRESTOR) 20 MG tablet Take 20 mg by mouth daily.    [provider]  triamterene-hydrochlorothiazide (MAXZIDE-25) 37.5-25 MG tablet Take 1 tablet by mouth every other day.  12/10/15   [provider]      Allergies    Patient has no known allergies.    Review of Systems   Review of Systems  Genitourinary:  Positive for hematuria.    Physical Exam Updated Vital Signs BP (!) 150/78   Pulse 77   Temp 100.2 F (37.9 C)   Resp 18   Ht 4\' 11"  (1.499 m)   Wt 47 kg   SpO2 96%   BMI 20.93 kg/m  Physical  Exam  ED Results / Procedures / Treatments   Labs (all labs ordered are listed, but only abnormal results are displayed) Labs Reviewed  CBC WITH DIFFERENTIAL/PLATELET - Abnormal; Notable for the following components:      Result Value   WBC 15.8 (*)    Neutro Abs 13.3 (*)    Monocytes Absolute 1.3 (*)    All other components within normal limits  COMPREHENSIVE METABOLIC PANEL - Abnormal; Notable for the following components:   Sodium 131 (*)    Chloride 97 (*)    Glucose, Bld 126 (*)    Total Bilirubin 2.1 (*)    GFR, Estimated 58 (*)    All other components within normal limits  URINALYSIS, ROUTINE W REFLEX MICROSCOPIC - Abnormal; Notable for the following components:   APPearance HAZY (*)    Specific Gravity, Urine 1.004 (*)    Hgb urine dipstick LARGE (*)    Leukocytes,Ua LARGE (*)    Bacteria, UA RARE (*)    All other components within normal limits    EKG None  Radiology ECHOCARDIOGRAM COMPLETE  Result Date: 08/18/2023    ECHOCARDIOGRAM REPORT   Patient Name:   Gina Harrell Date of Exam: 08/18/2023 Medical Rec #:  [id number]         Height:       59.0  in Accession #:    [id number]        Weight:       105.0 lb Date of Birth:  12/19/1940        BSA:          1.403 m Patient Age:    81 years          BP:           159/78 mmHg Patient Gender: F                 HR:           71 bpm. 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 Risk Factors:Hypertension and HLD.  Sonographer:    Clearence Ped RCS Referring Phys: [phone] MIHAI CROITORU IMPRESSIONS  1. Left ventricular ejection fraction, by estimation, is 65 to 70%. The left ventricle has normal function. The left ventricle has no regional wall motion abnormalities. There is moderate asymmetric left  ventricular hypertrophy of the septal segment. Left ventricular diastolic parameters are consistent with Grade I diastolic dysfunction (impaired relaxation). The average left ventricular global longitudinal strain is -21.9 %. The global longitudinal strain is normal.  2. Right ventricular systolic function is normal. The right ventricular size is normal. Tricuspid regurgitation signal is inadequate for assessing PA pressure.  3. The mitral valve is grossly normal. Trivial mitral valve regurgitation. No evidence of mitral stenosis.  4. The aortic valve is abnormal. There is moderate calcification of the aortic valve. Aortic valve regurgitation is mild. Mild to moderate aortic valve stenosis. Aortic valve area, by VTI measures 0.73 cm. Aortic valve mean gradient measures 19.0 mmHg.  Aortic valve Vmax measures 2.86 m/s.  5. The inferior vena cava is normal in size with greater than 50% respiratory variability, suggesting right atrial pressure of 3 mmHg.  6. Cannot exclude a small PFO. FINDINGS  Left Ventricle: Left ventricular ejection fraction, by estimation, is 65 to 70%. The left ventricle has normal function. The left ventricle has no regional wall motion abnormalities. The average left ventricular global longitudinal strain is -21.9 %. The global longitudinal strain is normal. 3D ejection fraction reviewed and evaluated as part of the interpretation. Alternate measurement of EF is felt to be most reflective of LV function. The left ventricular internal cavity size was normal in size. There is moderate asymmetric left ventricular hypertrophy of the septal segment. Left ventricular diastolic parameters are consistent with Grade I diastolic dysfunction (impaired relaxation). Right Ventricle: The right ventricular size is normal. No increase in right ventricular wall thickness. Right ventricular systolic function is normal. Tricuspid regurgitation signal is inadequate for assessing PA pressure. Left Atrium: Left  atrial size was normal in size. Right Atrium: Right atrial size was normal in size. Pericardium: Trivial pericardial effusion is present. Mitral Valve: The mitral valve is grossly normal. Trivial mitral valve regurgitation. No evidence of mitral valve stenosis. Tricuspid Valve: The tricuspid valve is normal in structure. Tricuspid valve regurgitation is trivial. No evidence of tricuspid stenosis. Aortic Valve: The aortic valve is abnormal. There is moderate calcification of the aortic valve. Aortic valve regurgitation is mild. Aortic regurgitation PHT measures 559 msec. Mild to moderate aortic stenosis is  present. Aortic valve mean gradient measures 19.0 mmHg. Aortic valve peak gradient measures 32.7 mmHg. Aortic valve area, by VTI measures 0.73 cm. Pulmonic Valve: The pulmonic valve was normal in structure. Pulmonic valve regurgitation is trivial. No evidence of pulmonic stenosis. Aorta: The aortic root is normal in size and structure. Venous: The inferior vena cava is normal in size with greater than 50% respiratory variability, suggesting right atrial pressure of 3 mmHg. IAS/Shunts: Cannot exclude a small PFO.  LEFT VENTRICLE PLAX 2D LVIDd:         3.40 cm   Diastology LVIDs:         2.10 cm   LV e' medial:    6.31 cm/s LV PW:         1.00 cm   LV E/e' medial:  8.6 LV IVS:        1.30 cm   LV e' lateral:   9.68 cm/s LVOT diam:     1.70 cm   LV E/e' lateral: 5.6 LV SV:         45 LV SV Index:   32        2D Longitudinal Strain LVOT Area:     2.27 cm  2D Strain GLS Avg:     -21.9 %                           3D Volume EF:                          3D EF:        57 %                          LV EDV:       64 ml                          LV ESV:       27 ml                          LV SV:        36 ml RIGHT VENTRICLE RV Basal diam:  2.40 cm RV S prime:     17.20 cm/s TAPSE (M-mode): 1.5 cm LEFT ATRIUM             Index        RIGHT ATRIUM          Index LA diam:        2.90 cm 2.07 cm/m   RA Area:     8.36 cm LA Vol  (A2C):   29.5 ml 21.03 ml/m  RA Volume:   13.30 ml 9.48 ml/m LA Vol (A4C):   23.3 ml 16.61 ml/m LA Biplane Vol: 26.9 ml 19.18 ml/m  AORTIC VALVE AV Area (Vmax):    0.71 cm AV Area (Vmean):   0.71 cm AV Area (VTI):     0.73 cm AV Vmax:           286.00 cm/s AV Vmean:          206.000 cm/s AV VTI:            0.625 m AV Peak Grad:      32.7 mmHg AV Mean Grad:      19.0 mmHg LVOT Vmax:  89.60 cm/s LVOT Vmean:        64.500 cm/s LVOT VTI:          0.200 m LVOT/AV VTI ratio: 0.32 AI PHT:            559 msec  AORTA Ao Root diam: 2.70 cm Ao Asc diam:  2.90 cm MITRAL VALVE MV Area (PHT):             SHUNTS MV Decel Time:             Systemic VTI:  0.20 m MV E velocity: 54.00 cm/s  Systemic Diam: 1.70 cm MV A velocity: 80.70 cm/s MV E/A ratio:  0.67 Weston Brass MD Electronically signed by Weston Brass MD Signature Date/Time: 08/18/2023/12:38:59 PM    Final     Procedures Procedures  {Document cardiac monitor, telemetry assessment procedure when appropriate:1}  Medications Ordered in ED Medications - No data to display  ED Course/ Medical Decision Making/ A&P   {   Click here for ABCD2, HEART and other calculatorsREFRESH Note before signing :1}                              Medical Decision Making  ***  {Document critical care time when appropriate:1} {Document review of labs and clinical decision tools ie heart score, Chads2Vasc2 etc:1}  {Document your independent review of radiology images, and any outside records:1} {Document your discussion with family members, caretakers, and with consultants:1} {Document social determinants of health affecting pt's care:1} {Document your decision making why or why not admission, treatments were needed:1} Final Clinical Impression(s) / ED Diagnoses Final diagnoses:  None    Rx / DC Orders ED Discharge Orders     None

## 2023-08-19 ENCOUNTER — Other Ambulatory Visit (HOSPITAL_COMMUNITY): Payer: Self-pay | Admitting: Emergency Medicine

## 2023-08-19 DIAGNOSIS — Z79890 Hormone replacement therapy (postmenopausal): Secondary | ICD-10-CM | POA: Diagnosis not present

## 2023-08-19 DIAGNOSIS — E876 Hypokalemia: Secondary | ICD-10-CM | POA: Diagnosis not present

## 2023-08-19 DIAGNOSIS — I350 Nonrheumatic aortic (valve) stenosis: Secondary | ICD-10-CM

## 2023-08-19 DIAGNOSIS — Z8249 Family history of ischemic heart disease and other diseases of the circulatory system: Secondary | ICD-10-CM | POA: Diagnosis not present

## 2023-08-19 DIAGNOSIS — F17210 Nicotine dependence, cigarettes, uncomplicated: Secondary | ICD-10-CM | POA: Diagnosis present

## 2023-08-19 DIAGNOSIS — N390 Urinary tract infection, site not specified: Secondary | ICD-10-CM | POA: Diagnosis present

## 2023-08-19 DIAGNOSIS — Z833 Family history of diabetes mellitus: Secondary | ICD-10-CM | POA: Diagnosis not present

## 2023-08-19 DIAGNOSIS — A419 Sepsis, unspecified organism: Secondary | ICD-10-CM

## 2023-08-19 DIAGNOSIS — M810 Age-related osteoporosis without current pathological fracture: Secondary | ICD-10-CM | POA: Diagnosis present

## 2023-08-19 DIAGNOSIS — Z79899 Other long term (current) drug therapy: Secondary | ICD-10-CM | POA: Diagnosis not present

## 2023-08-19 DIAGNOSIS — K219 Gastro-esophageal reflux disease without esophagitis: Secondary | ICD-10-CM | POA: Diagnosis present

## 2023-08-19 DIAGNOSIS — Z800 Family history of malignant neoplasm of digestive organs: Secondary | ICD-10-CM | POA: Diagnosis not present

## 2023-08-19 DIAGNOSIS — N12 Tubulo-interstitial nephritis, not specified as acute or chronic: Secondary | ICD-10-CM | POA: Diagnosis present

## 2023-08-19 DIAGNOSIS — Z803 Family history of malignant neoplasm of breast: Secondary | ICD-10-CM | POA: Diagnosis not present

## 2023-08-19 DIAGNOSIS — N136 Pyonephrosis: Secondary | ICD-10-CM | POA: Diagnosis present

## 2023-08-19 DIAGNOSIS — E7800 Pure hypercholesterolemia, unspecified: Secondary | ICD-10-CM | POA: Diagnosis present

## 2023-08-19 DIAGNOSIS — I10 Essential (primary) hypertension: Secondary | ICD-10-CM | POA: Diagnosis present

## 2023-08-19 DIAGNOSIS — N1330 Unspecified hydronephrosis: Secondary | ICD-10-CM | POA: Diagnosis not present

## 2023-08-19 DIAGNOSIS — Z720 Tobacco use: Secondary | ICD-10-CM | POA: Diagnosis not present

## 2023-08-19 DIAGNOSIS — E871 Hypo-osmolality and hyponatremia: Secondary | ICD-10-CM | POA: Diagnosis not present

## 2023-08-19 DIAGNOSIS — E039 Hypothyroidism, unspecified: Secondary | ICD-10-CM | POA: Diagnosis present

## 2023-08-19 DIAGNOSIS — Z8551 Personal history of malignant neoplasm of bladder: Secondary | ICD-10-CM

## 2023-08-19 DIAGNOSIS — R310 Gross hematuria: Secondary | ICD-10-CM | POA: Diagnosis present

## 2023-08-19 LAB — BASIC METABOLIC PANEL
Anion gap: 12 (ref 5–15)
BUN: 17 mg/dL (ref 8–23)
CO2: 20 mmol/L — ABNORMAL LOW (ref 22–32)
Calcium: 9.1 mg/dL (ref 8.9–10.3)
Chloride: 103 mmol/L (ref 98–111)
Creatinine, Ser: 0.95 mg/dL (ref 0.44–1.00)
GFR, Estimated: >60 mL/min (ref >60)
Glucose, Bld: 142 mg/dL — ABNORMAL HIGH (ref 70–99)
Potassium: 3.1 mmol/L — ABNORMAL LOW (ref 3.5–5.1)
Sodium: 135 mmol/L (ref 135–145)

## 2023-08-19 LAB — CBC
HCT: 42.1 % (ref 36.0–46.0)
Hemoglobin: 13.8 g/dL (ref 12.0–15.0)
MCH: 29.9 pg (ref 26.0–34.0)
MCHC: 32.8 g/dL (ref 30.0–36.0)
MCV: 91.3 fL (ref 80.0–100.0)
Platelets: 167 10*3/uL (ref 150–400)
RBC: 4.61 MIL/uL (ref 3.87–5.11)
RDW: 12.6 % (ref 11.5–15.5)
WBC: 11.7 10*3/uL — ABNORMAL HIGH (ref 4.0–10.5)
nRBC: 0 % (ref 0.0–0.2)

## 2023-08-19 MED ORDER — SODIUM CHLORIDE 0.9 % IV SOLN
2.0000 g | INTRAVENOUS | Status: DC
  Administered 2023-08-19: 2 g via INTRAVENOUS
  Filled 2023-08-19: qty 20

## 2023-08-19 MED ORDER — PROCHLORPERAZINE EDISYLATE 10 MG/2ML IJ SOLN
10.0000 mg | Freq: Four times a day (QID) | INTRAMUSCULAR | Status: DC | PRN
  Administered 2023-08-19: 10 mg via INTRAVENOUS
  Filled 2023-08-19: qty 2

## 2023-08-19 MED ORDER — ALPRAZOLAM 0.5 MG PO TABS
0.5000 mg | ORAL_TABLET | Freq: Every day | ORAL | Status: DC
  Filled 2023-08-19: qty 1

## 2023-08-19 MED ORDER — ONDANSETRON HCL 4 MG/2ML IJ SOLN
4.0000 mg | Freq: Four times a day (QID) | INTRAMUSCULAR | Status: DC | PRN
  Administered 2023-08-19: 4 mg via INTRAVENOUS
  Filled 2023-08-19: qty 2

## 2023-08-19 MED ORDER — ESCITALOPRAM OXALATE 10 MG PO TABS
10.0000 mg | ORAL_TABLET | Freq: Every day | ORAL | Status: DC
  Administered 2023-08-19: 10 mg via ORAL
  Filled 2023-08-19 (×2): qty 1

## 2023-08-19 MED ORDER — ACETAMINOPHEN 650 MG RE SUPP: 650.0000 mg | Freq: Four times a day (QID) | RECTAL | Status: DC | PRN

## 2023-08-19 MED ORDER — HYDROMORPHONE HCL 1 MG/ML IJ SOLN
0.5000 mg | INTRAMUSCULAR | Status: DC | PRN
  Administered 2023-08-19: 1 mg via INTRAVENOUS
  Filled 2023-08-19: qty 1

## 2023-08-19 MED ORDER — HYDRALAZINE HCL 20 MG/ML IJ SOLN: 5.0000 mg | INTRAMUSCULAR | Status: DC | PRN

## 2023-08-19 MED ORDER — ENOXAPARIN SODIUM 40 MG/0.4ML IJ SOSY: 40.0000 mg | PREFILLED_SYRINGE | INTRAMUSCULAR | Status: DC

## 2023-08-19 MED ORDER — ALBUTEROL SULFATE HFA 108 (90 BASE) MCG/ACT IN AERS: 2.0000 | INHALATION_SPRAY | RESPIRATORY_TRACT | Status: DC | PRN

## 2023-08-19 MED ORDER — FENTANYL CITRATE PF 50 MCG/ML IJ SOSY
25.0000 ug | PREFILLED_SYRINGE | INTRAMUSCULAR | Status: DC | PRN
  Administered 2023-08-19: 25 ug via INTRAVENOUS
  Filled 2023-08-19: qty 1

## 2023-08-19 MED ORDER — ROSUVASTATIN CALCIUM 20 MG PO TABS
20.0000 mg | ORAL_TABLET | Freq: Every day | ORAL | Status: DC
  Administered 2023-08-19 – 2023-08-20 (×2): 20 mg via ORAL
  Filled 2023-08-19 (×2): qty 1

## 2023-08-19 MED ORDER — ONDANSETRON HCL 4 MG PO TABS
4.0000 mg | ORAL_TABLET | Freq: Four times a day (QID) | ORAL | Status: DC | PRN
  Administered 2023-08-19: 4 mg via ORAL
  Filled 2023-08-19: qty 1

## 2023-08-19 MED ORDER — ALBUTEROL SULFATE (2.5 MG/3ML) 0.083% IN NEBU: 2.5000 mg | INHALATION_SOLUTION | RESPIRATORY_TRACT | Status: DC | PRN

## 2023-08-19 MED ORDER — SODIUM CHLORIDE 0.9 % IV SOLN: 1.0000 g | INTRAVENOUS | Status: DC

## 2023-08-19 MED ORDER — LEVOTHYROXINE SODIUM 50 MCG PO TABS
50.0000 ug | ORAL_TABLET | Freq: Every day | ORAL | Status: DC
  Administered 2023-08-19 – 2023-08-20 (×2): 50 ug via ORAL
  Filled 2023-08-19 (×2): qty 1

## 2023-08-19 MED ORDER — POTASSIUM CHLORIDE CRYS ER 20 MEQ PO TBCR
40.0000 meq | EXTENDED_RELEASE_TABLET | Freq: Once | ORAL | Status: AC
  Administered 2023-08-19: 40 meq via ORAL
  Filled 2023-08-19: qty 2

## 2023-08-19 MED ORDER — ACETAMINOPHEN 325 MG PO TABS
650.0000 mg | ORAL_TABLET | Freq: Four times a day (QID) | ORAL | Status: DC | PRN
  Filled 2023-08-19: qty 2

## 2023-08-19 NOTE — Assessment & Plan Note (Addendum)
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  Though does look slightly more severe today compared to 2022. EDP d/w urologist who feels pt does NOT need urgent intervention as a result of chronicity.  Defer decision on intervention to urologist. Pt does have fever, WBC, but is HYPERtensive at this time. [facility] Urology consulted as noted above Clear liquid diet for the moment just-in-case they end up changing their mind and deciding that pt needs intervention for possible pyohydronephrosis. Fentanyl PRN pain (dilaudid caused nausea). Discussed everything with patient.

## 2023-08-19 NOTE — Progress Notes (Addendum)
Adrenals/Urinary Tract: No adrenal nodule. Left hydroureteronephrosis is in part chronic, but progressed from prior exam. Left ureteral dilatation is also chronic, but mildly progressed. Low insertion of the ureter in the bladder in the low pelvis. No renal ureteral calculi. There may be some soft tissue thickening in the region of the ureteral insertion of the bladder, although not well assessed on this unenhanced exam. Insert prominence of the right renal collecting system without hydronephrosis. No right hydroureter. The small hypodense lesions in the right kidney on prior grossly stable, but not as well delineated on the current exam in the absence of contrast. No further follow-up imaging is recommended. Bladder slightly deviated into the right pelvis, low lying. No bladder stone or bladder inflammation. Stomach/Bowel: The stomach is nondistended. Small posterior gastric diverticulum without inflammation. 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 Small bowel is otherwise unremarkable without obstruction or inflammation. The appendix is not well-defined on the current exam. No evidence of appendicitis. Small to moderate volume of colonic stool. No colonic inflammation. Vascular/Lymphatic: Aortic and branch atherosclerosis. No aneurysm. No bulky abdominopelvic adenopathy. Reproductive: Status post hysterectomy. No adnexal masses. Other: No ascites.  Small fat containing bilateral  inguinal hernias. Musculoskeletal: Scoliosis and degenerative change in the spine. There are no acute or suspicious osseous abnormalities. IMPRESSION: 1. Left hydroureteronephrosis is in part chronic, but progressed from November 2023 exam. Left ureteral dilatation is also chronic, but mildly progressed. The left ureteral insertion is low within the bladder. There may be some soft tissue thickening in the region of the ureteral insertion, not well assessed on this unenhanced exam. Recommend urologic follow-up. 2. No urolithiasis. Aortic Atherosclerosis (ICD10-I70.0). Electronically Signed   By: Narda Rutherford M.D.   On: 08/18/2023 21:35   ECHOCARDIOGRAM COMPLETE  Result Date: 08/18/2023    ECHOCARDIOGRAM REPORT   Patient Name:   Gina Harrell Date of Exam: 08/18/2023 Medical Rec #:  [id number]         Height:       59.0 in Accession #:    [id number]        Weight:       105.0 lb Date of Birth:  07-Nov-1941        BSA:          1.403 m Patient Age:    81 years          BP:           159/78 mmHg Patient Gender: F                 HR:           71 bpm. 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 Risk Factors:Hypertension and HLD.  Sonographer:    Clearence Ped RCS Referring Phys: [phone] MIHAI CROITORU IMPRESSIONS  1. Left ventricular ejection fraction, by estimation, is 65 to 70%. The left ventricle has normal function. The left ventricle has no regional wall motion abnormalities. There is moderate asymmetric left ventricular hypertrophy of the septal segment. Left ventricular diastolic parameters are consistent with Grade I diastolic dysfunction (impaired relaxation). The average left ventricular global longitudinal strain is -21.9 %. The global longitudinal strain is normal.  2.  Adrenals/Urinary Tract: No adrenal nodule. Left hydroureteronephrosis is in part chronic, but progressed from prior exam. Left ureteral dilatation is also chronic, but mildly progressed. Low insertion of the ureter in the bladder in the low pelvis. No renal ureteral calculi. There may be some soft tissue thickening in the region of the ureteral insertion of the bladder, although not well assessed on this unenhanced exam. Insert prominence of the right renal collecting system without hydronephrosis. No right hydroureter. The small hypodense lesions in the right kidney on prior grossly stable, but not as well delineated on the current exam in the absence of contrast. No further follow-up imaging is recommended. Bladder slightly deviated into the right pelvis, low lying. No bladder stone or bladder inflammation. Stomach/Bowel: The stomach is nondistended. Small posterior gastric diverticulum without inflammation. 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 Small bowel is otherwise unremarkable without obstruction or inflammation. The appendix is not well-defined on the current exam. No evidence of appendicitis. Small to moderate volume of colonic stool. No colonic inflammation. Vascular/Lymphatic: Aortic and branch atherosclerosis. No aneurysm. No bulky abdominopelvic adenopathy. Reproductive: Status post hysterectomy. No adnexal masses. Other: No ascites.  Small fat containing bilateral  inguinal hernias. Musculoskeletal: Scoliosis and degenerative change in the spine. There are no acute or suspicious osseous abnormalities. IMPRESSION: 1. Left hydroureteronephrosis is in part chronic, but progressed from November 2023 exam. Left ureteral dilatation is also chronic, but mildly progressed. The left ureteral insertion is low within the bladder. There may be some soft tissue thickening in the region of the ureteral insertion, not well assessed on this unenhanced exam. Recommend urologic follow-up. 2. No urolithiasis. Aortic Atherosclerosis (ICD10-I70.0). Electronically Signed   By: Narda Rutherford M.D.   On: 08/18/2023 21:35   ECHOCARDIOGRAM COMPLETE  Result Date: 08/18/2023    ECHOCARDIOGRAM REPORT   Patient Name:   Gina Harrell Date of Exam: 08/18/2023 Medical Rec #:  [id number]         Height:       59.0 in Accession #:    [id number]        Weight:       105.0 lb Date of Birth:  07-Nov-1941        BSA:          1.403 m Patient Age:    81 years          BP:           159/78 mmHg Patient Gender: F                 HR:           71 bpm. 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 Risk Factors:Hypertension and HLD.  Sonographer:    Clearence Ped RCS Referring Phys: [phone] MIHAI CROITORU IMPRESSIONS  1. Left ventricular ejection fraction, by estimation, is 65 to 70%. The left ventricle has normal function. The left ventricle has no regional wall motion abnormalities. There is moderate asymmetric left ventricular hypertrophy of the septal segment. Left ventricular diastolic parameters are consistent with Grade I diastolic dysfunction (impaired relaxation). The average left ventricular global longitudinal strain is -21.9 %. The global longitudinal strain is normal.  2.  [facility]                                                                              Shateka Petrea, is a 82 y.o. 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  In ED, patient was noted to be febrile 100.6 F, WBCs 15.8 K.  UA showed UTI. CT scan showed left-sided hydronephrosis, slightly progressed from 2 years ago. Patient was placed on IV antibiotics and admitted for further workup.   Assessment & Plan    Principal Problem: Left-sided pyelonephritis, complicated UTI, left-sided hydronephrosis -Sepsis ruled out -In the setting of chronic left-sided hydronephrosis, progressed from 2 years ago, history of bladder CA -Follow blood cultures and sensitivities, urine culture and sensitivities (unfortunately were not collected prior to initiating antibiotics in ED) -Continue IV Rocephin, increased to 2 g IV daily -Urology consulted, appreciate recommendations, could be hemorrhagic cystitis from chronic cystitis or recurrent bladder CA. -Continue antiemetics as needed  Active Problems:   History of bladder cancer, lateral wall of urinary bladder and bladder neck -Per patient, in remission after excision and chemotherapy, >20 years ago.  Used to follow Dr. Patsi Sears and now Dr. Liliane Shi.      HTN (hypertension) -BP soft, continue to hold antihypertensives/Maxzide    Aortic stenosis -Mild to moderate, no acute issues    Hypothyroidism -Continue Synthroid  Estimated body mass index is 20.93 kg/m as calculated from the following:   Height  as of this encounter: 4\' 11"  (1.499 m).   Weight as of this encounter: 47 kg.  Code Status: Full CODE STATUS DVT Prophylaxis:  Place and maintain sequential compression device Start: 08/19/23 0308   Level of Care: Level of care: Med-Surg Family Communication: Updated patient Disposition Plan:      Remains inpatient appropriate: Follow blood cultures, urine cultures, continue IV antibiotics, not safe for discharge while workup in progress   Procedures:  None  Consultants:   Urology  Antimicrobials:   Anti-infectives (From admission, onward)    Start     Dose/Rate Route Frequency Ordered Stop   08/19/23 2200  cefTRIAXone (ROCEPHIN) 1 g in sodium chloride 0.9 % 100 mL IVPB        1 g 200 mL/hr over 30 Minutes Intravenous Every 24 hours 08/19/23 0228     08/18/23 2130  cefTRIAXone (ROCEPHIN) 1 g in sodium chloride 0.9 % 100 mL IVPB        1 g 200 mL/hr over 30 Minutes Intravenous  Once 08/18/23 2128 08/18/23 2230          Medications  ALPRAZolam  0.5 mg Oral QHS   escitalopram  10 mg Oral Daily   levothyroxine  50 mcg Oral QAC  Adrenals/Urinary Tract: No adrenal nodule. Left hydroureteronephrosis is in part chronic, but progressed from prior exam. Left ureteral dilatation is also chronic, but mildly progressed. Low insertion of the ureter in the bladder in the low pelvis. No renal ureteral calculi. There may be some soft tissue thickening in the region of the ureteral insertion of the bladder, although not well assessed on this unenhanced exam. Insert prominence of the right renal collecting system without hydronephrosis. No right hydroureter. The small hypodense lesions in the right kidney on prior grossly stable, but not as well delineated on the current exam in the absence of contrast. No further follow-up imaging is recommended. Bladder slightly deviated into the right pelvis, low lying. No bladder stone or bladder inflammation. Stomach/Bowel: The stomach is nondistended. Small posterior gastric diverticulum without inflammation. 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 Small bowel is otherwise unremarkable without obstruction or inflammation. The appendix is not well-defined on the current exam. No evidence of appendicitis. Small to moderate volume of colonic stool. No colonic inflammation. Vascular/Lymphatic: Aortic and branch atherosclerosis. No aneurysm. No bulky abdominopelvic adenopathy. Reproductive: Status post hysterectomy. No adnexal masses. Other: No ascites.  Small fat containing bilateral  inguinal hernias. Musculoskeletal: Scoliosis and degenerative change in the spine. There are no acute or suspicious osseous abnormalities. IMPRESSION: 1. Left hydroureteronephrosis is in part chronic, but progressed from November 2023 exam. Left ureteral dilatation is also chronic, but mildly progressed. The left ureteral insertion is low within the bladder. There may be some soft tissue thickening in the region of the ureteral insertion, not well assessed on this unenhanced exam. Recommend urologic follow-up. 2. No urolithiasis. Aortic Atherosclerosis (ICD10-I70.0). Electronically Signed   By: Narda Rutherford M.D.   On: 08/18/2023 21:35   ECHOCARDIOGRAM COMPLETE  Result Date: 08/18/2023    ECHOCARDIOGRAM REPORT   Patient Name:   Gina Harrell Date of Exam: 08/18/2023 Medical Rec #:  [id number]         Height:       59.0 in Accession #:    [id number]        Weight:       105.0 lb Date of Birth:  07-Nov-1941        BSA:          1.403 m Patient Age:    81 years          BP:           159/78 mmHg Patient Gender: F                 HR:           71 bpm. 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 Risk Factors:Hypertension and HLD.  Sonographer:    Clearence Ped RCS Referring Phys: [phone] MIHAI CROITORU IMPRESSIONS  1. Left ventricular ejection fraction, by estimation, is 65 to 70%. The left ventricle has normal function. The left ventricle has no regional wall motion abnormalities. There is moderate asymmetric left ventricular hypertrophy of the septal segment. Left ventricular diastolic parameters are consistent with Grade I diastolic dysfunction (impaired relaxation). The average left ventricular global longitudinal strain is -21.9 %. The global longitudinal strain is normal.  2.  [facility]                                                                              Shateka Petrea, is a 82 y.o. 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  In ED, patient was noted to be febrile 100.6 F, WBCs 15.8 K.  UA showed UTI. CT scan showed left-sided hydronephrosis, slightly progressed from 2 years ago. Patient was placed on IV antibiotics and admitted for further workup.   Assessment & Plan    Principal Problem: Left-sided pyelonephritis, complicated UTI, left-sided hydronephrosis -Sepsis ruled out -In the setting of chronic left-sided hydronephrosis, progressed from 2 years ago, history of bladder CA -Follow blood cultures and sensitivities, urine culture and sensitivities (unfortunately were not collected prior to initiating antibiotics in ED) -Continue IV Rocephin, increased to 2 g IV daily -Urology consulted, appreciate recommendations, could be hemorrhagic cystitis from chronic cystitis or recurrent bladder CA. -Continue antiemetics as needed  Active Problems:   History of bladder cancer, lateral wall of urinary bladder and bladder neck -Per patient, in remission after excision and chemotherapy, >20 years ago.  Used to follow Dr. Patsi Sears and now Dr. Liliane Shi.      HTN (hypertension) -BP soft, continue to hold antihypertensives/Maxzide    Aortic stenosis -Mild to moderate, no acute issues    Hypothyroidism -Continue Synthroid  Estimated body mass index is 20.93 kg/m as calculated from the following:   Height  as of this encounter: 4\' 11"  (1.499 m).   Weight as of this encounter: 47 kg.  Code Status: Full CODE STATUS DVT Prophylaxis:  Place and maintain sequential compression device Start: 08/19/23 0308   Level of Care: Level of care: Med-Surg Family Communication: Updated patient Disposition Plan:      Remains inpatient appropriate: Follow blood cultures, urine cultures, continue IV antibiotics, not safe for discharge while workup in progress   Procedures:  None  Consultants:   Urology  Antimicrobials:   Anti-infectives (From admission, onward)    Start     Dose/Rate Route Frequency Ordered Stop   08/19/23 2200  cefTRIAXone (ROCEPHIN) 1 g in sodium chloride 0.9 % 100 mL IVPB        1 g 200 mL/hr over 30 Minutes Intravenous Every 24 hours 08/19/23 0228     08/18/23 2130  cefTRIAXone (ROCEPHIN) 1 g in sodium chloride 0.9 % 100 mL IVPB        1 g 200 mL/hr over 30 Minutes Intravenous  Once 08/18/23 2128 08/18/23 2230          Medications  ALPRAZolam  0.5 mg Oral QHS   escitalopram  10 mg Oral Daily   levothyroxine  50 mcg Oral QAC

## 2023-08-19 NOTE — Assessment & Plan Note (Addendum)
Holding Maxzide for the moment until we see what direction her UTI / pyelonephritis takes. Though BP is running high, given this and a mean gradient across her aortic valve (on 2d echo done just yesterday), will go ahead and put in for small dose of PRN hydralazine if needed.

## 2023-08-19 NOTE — Assessment & Plan Note (Signed)
Mild-mod.  Followed by cards as noted above.

## 2023-08-19 NOTE — Consult Note (Addendum)
Urology Consult Note   Requesting Attending Physician:  Cathren Harsh, MD Service Providing Consult: Urology  Consulting Attending: Dr. Jennette Bill   Reason for Consult:  left hydronephrosis and hematuria  HPI: MCCARTNEY KEEL is seen in consultation for reasons noted above at the request of Rai, Delene Ruffini, MD. patient is known to our practice and was cared for by Dr. Venetia Constable, and now Dr. Liliane Shi.  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  She has been seen for chronic cystitis and has known chronic left side hydroureteronephrosis.  She was initially seen in urgent care for hematuria and eventually presented to [facility].  CT A/P showed slight interval worsening of left side hydroureteronephrosis and perinephric stranding.  Her white blood cell count was 15.8, now 11.7.  Her renal function remains preserved  ------------------  Assessment:  82 y.o. female with pyelonephritis, hematuria, and chronic left-sided hydroureteronephrosis.   Recommendations: # Pyelonephritis Her imaging has reflected chronic hydronephrosis and hydroureter in the past.  Her most recent renal ultrasound in our clinic showed a resolution of this in November of last year.  In reviewing the imaging with Dr. Francella Solian extends to the [facility] concern that hydro is TCC recurrence, reflux, or hydro caused from pyelo.  That being said, with her history of bladder cancer it makes the most sense to have her follow-up closely in clinic for cystoscopy.  Right now her renal function is preserved, she has been fever free since last night, leukocytosis is nearly resolved, and there is no immediate indication for stent.  Instrumentation in the context of infection would be best avoided if possible.  I will have our schedulers contact her today hopefully for clinic cystoscopy with f/u imaging.  # Hematuria This could be  representative of hemorrhagic cystitis from her chronic cystitis or a recurrent bladder cancer that is bleeding.  I will request the nurses collect a string of bottles overnight and reassess in the morning.  Trend hemoglobin  I will plan to see her again tomorrow.  If she should have an acute change, stent can be considered at that time. Case and plan discussed with Dr. Jennette Bill  Past Medical History: Past Medical History:  Diagnosis Date   Cancer [facility])    Bladder cancer   Elevated cholesterol    GERD (gastroesophageal reflux disease)    Heart murmur    Heart murmur    Hypertension    Hypothyroidism    Osteoporosis    Uterine prolapse     Past Surgical History:  Past Surgical History:  Procedure Laterality Date   BLADDER TUMOR EXCISION     BREAST EXCISIONAL BIOPSY Right    CAROTID DOPPLER  2013   COLONOSCOPY WITH PROPOFOL N/A 02/17/2016   Procedure: COLONOSCOPY WITH PROPOFOL;  Surgeon: Charolett Bumpers, MD;  Location: [facility] ENDOSCOPY;  Service: Endoscopy;  Laterality: N/A;   DOPPLER ECHOCARDIOGRAPHY  2013   ESOPHAGOGASTRODUODENOSCOPY (EGD) WITH PROPOFOL N/A 02/17/2016   Procedure: ESOPHAGOGASTRODUODENOSCOPY (EGD) WITH PROPOFOL;  Surgeon: Charolett Bumpers, MD;  Location: [facility] ENDOSCOPY;  Service: Endoscopy;  Laterality: N/A;   VAGINAL HYSTERECTOMY      Medication: Current Facility-Administered Medications  Medication Dose Route Frequency Provider Last Rate Last Admin   acetaminophen (TYLENOL) tablet 650 mg  650 mg Oral Q6H PRN Hillary Bow, DO       Or   acetaminophen (TYLENOL) suppository 650 mg  650 mg Rectal Q6H PRN Hillary Bow, DO       albuterol (PROVENTIL) (2.5 MG/3ML) 0.083% nebulizer solution 2.5 mg  2.5 mg Nebulization Q4H PRN Hillary Bow, DO       ALPRAZolam Prudy Feeler) tablet 0.5 mg  0.5 mg Oral QHS Julian Reil, Jared M, DO       cefTRIAXone (ROCEPHIN) 1 g in sodium chloride 0.9 % 100 mL IVPB  1 g Intravenous Q24H Julian Reil, Jared M, DO       escitalopram (LEXAPRO)  tablet 10 mg  10 mg Oral Daily Lyda Perone M, DO   10 mg at 08/19/23 1020   fentaNYL (SUBLIMAZE) injection 25-50 mcg  25-50 mcg Intravenous Q2H PRN Hillary Bow, DO       hydrALAZINE (APRESOLINE) injection 5 mg  5 mg Intravenous Q4H PRN Hillary Bow, DO       levothyroxine (SYNTHROID) tablet 50 mcg  50 mcg Oral QAC breakfast Hillary Bow, DO   50 mcg at 08/19/23 0525   ondansetron (ZOFRAN) tablet 4 mg  4 mg Oral Q6H PRN Hillary Bow, DO   4 mg at 08/19/23 1020   Or   ondansetron (ZOFRAN) injection 4 mg  4 mg Intravenous Q6H PRN Hillary Bow, DO   4 mg at 08/19/23 0327   rosuvastatin (CRESTOR) tablet 20 mg  20 mg Oral Daily Lyda Perone M, DO   20 mg at 08/19/23 1021    Allergies: No Known Allergies  Social History: Social History   Tobacco Use   Smoking status: Every Day    Current packs/day: 0.50    Average packs/day: 0.5 packs/day for 50.0 years (25.0 ttl pk-yrs)    Types: Cigarettes   Smokeless tobacco: Never  Substance Use Topics   Alcohol use: Yes    Alcohol/week: 7.0 standard drinks of alcohol    Types: 7 Standard drinks or equivalent per week   Drug use: No    Family History Family History  Problem Relation Age of Onset   Cancer Mother        Colon cancer   Hypertension Mother    Heart disease Father    Breast cancer Maternal Aunt        Age 27's   Diabetes Maternal Grandmother     Review of Systems  Genitourinary:  Positive for hematuria.     Objective   Vital signs in last 24 hours: BP 111/61 (BP Location: Left Arm)   Pulse 69   Temp 98.7 F (37.1 C) (Oral)   Resp 17   Ht 4\' 11"  (1.499 m)   Wt 47 kg   SpO2 98%   BMI 20.93 kg/m   Physical Exam:  General: NAD, A&O, resting, appropriate HEENT: [place]/AT Pulmonary: Normal work of breathing Cardiovascular: RRR, no cyanosis   Most Recent Labs: Lab Results  Component Value Date   WBC 11.7 (H) 08/19/2023   HGB 13.8 08/19/2023   HCT 42.1 08/19/2023   PLT 167 08/19/2023     Lab Results  Component Value Date   NA 135 08/19/2023   K 3.1 (L) 08/19/2023   CL 103 08/19/2023   CO2 20 (L) 08/19/2023   BUN 17 08/19/2023   CREATININE 0.95 08/19/2023   CALCIUM 9.1 08/19/2023    No results found for: "INR", "APTT"   Urine Culture: @[id number] (laburin,org,[id number])@   IMAGING: CT Renal Stone Study  Result Date: 08/18/2023 CLINICAL DATA:  Abdominal/flank pain, stone suspected left flank, hematuria. Technologist notes state: Patient states  she was referred from urgent care where she had a CT scan. EXAM: CT ABDOMEN AND PELVIS WITHOUT CONTRAST TECHNIQUE: Multidetector CT imaging of the abdomen and pelvis was performed following the standard protocol without IV contrast. RADIATION DOSE REDUCTION: This exam was performed according to the departmental dose-optimization program which includes automated exposure control, adjustment of the mA and/or kV according to patient size and/or use of iterative reconstruction technique. COMPARISON:  CT 09/15/2021 FINDINGS: Lower chest: Clear lung bases.  Coronary artery calcifications Hepatobiliary: Chronic calcified granuloma anterior to the left hepatic lobe. Unenhanced liver otherwise unremarkable. Gallbladder physiologically distended, no calcified stone. No biliary dilatation. Pancreas: No ductal dilatation or inflammation. Spleen: Normal in size without focal abnormality. Adrenals/Urinary Tract: No adrenal nodule. Left hydroureteronephrosis is in part chronic, but progressed from prior exam. Left ureteral dilatation is also chronic, but mildly progressed. Low insertion of the ureter in the bladder in the low pelvis. No renal ureteral calculi. There may be some soft tissue thickening in the region of the ureteral insertion of the bladder, although not well assessed on this unenhanced exam. Insert prominence of the right renal collecting system without hydronephrosis. No right hydroureter. The small hypodense lesions in the right  kidney on prior grossly stable, but not as well delineated on the current exam in the absence of contrast. No further follow-up imaging is recommended. Bladder slightly deviated into the right pelvis, low lying. No bladder stone or bladder inflammation. Stomach/Bowel: The stomach is nondistended. Small posterior gastric diverticulum without inflammation. 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 Small bowel is otherwise unremarkable without obstruction or inflammation. The appendix is not well-defined on the current exam. No evidence of appendicitis. Small to moderate volume of colonic stool. No colonic inflammation. Vascular/Lymphatic: Aortic and branch atherosclerosis. No aneurysm. No bulky abdominopelvic adenopathy. Reproductive: Status post hysterectomy. No adnexal masses. Other: No ascites.  Small fat containing bilateral inguinal hernias. Musculoskeletal: Scoliosis and degenerative change in the spine. There are no acute or suspicious osseous abnormalities. IMPRESSION: 1. Left hydroureteronephrosis is in part chronic, but progressed from November 2023 exam. Left ureteral dilatation is also chronic, but mildly progressed. The left ureteral insertion is low within the bladder. There may be some soft tissue thickening in the region of the ureteral insertion, not well assessed on this unenhanced exam. Recommend urologic follow-up. 2. No urolithiasis. Aortic Atherosclerosis (ICD10-I70.0). Electronically Signed   By: Narda Rutherford M.D.   On: 08/18/2023 21:35   ECHOCARDIOGRAM COMPLETE  Result Date: 08/18/2023    ECHOCARDIOGRAM REPORT   Patient Name:   TAMIEKA REEB Date of Exam: 08/18/2023 Medical Rec #:  [id number]         Height:       59.0 in Accession #:    [id number]        Weight:       105.0 lb Date of Birth:  10-13-1941        BSA:          1.403 m Patient Age:    81 years          BP:           159/78 mmHg Patient Gender: F                 HR:           71 bpm. Exam Location:  [address]  Procedure: 2D Echo, 3D Echo, Cardiac Doppler, Color Doppler and Strain Analysis Indications:    I35.0 Nonrheumatic aortic (valve) stenosis  History:        Patient has prior history of Echocardiogram examinations, most                 recent 07/11/2020. Risk Factors:Hypertension and HLD.  Sonographer:    Clearence Ped RCS Referring Phys: [phone] MIHAI CROITORU IMPRESSIONS  1. Left ventricular ejection fraction, by estimation, is 65 to 70%. The left ventricle has normal function. The left ventricle has no regional wall motion abnormalities. There is moderate asymmetric left ventricular hypertrophy of the septal segment. Left ventricular diastolic parameters are consistent with Grade I diastolic dysfunction (impaired relaxation). The average left ventricular global longitudinal strain is -21.9 %. The global longitudinal strain is normal.  2. Right ventricular systolic function is normal. The right ventricular size is normal. Tricuspid regurgitation signal is inadequate for assessing PA pressure.  3. The mitral valve is grossly normal. Trivial mitral valve regurgitation. No evidence of mitral stenosis.  4. The aortic valve is abnormal. There is moderate calcification of the aortic valve. Aortic valve regurgitation is mild. Mild to moderate aortic valve stenosis. Aortic valve area, by VTI measures 0.73 cm. Aortic valve mean gradient measures 19.0 mmHg.  Aortic valve Vmax measures 2.86 m/s.  5. The inferior vena cava is normal in size with greater than 50% respiratory variability, suggesting right atrial pressure of 3 mmHg.  6. Cannot exclude a small PFO. FINDINGS  Left Ventricle: Left ventricular ejection fraction, by estimation, is 65 to 70%. The left ventricle has normal function. The left ventricle has no regional wall motion abnormalities. The average left ventricular global longitudinal strain is -21.9 %. The global longitudinal strain is normal. 3D ejection fraction reviewed and evaluated as part of the  interpretation. Alternate measurement of EF is felt to be most reflective of LV function. The left ventricular internal cavity size was normal in size. There is moderate asymmetric left ventricular hypertrophy of the septal segment. Left ventricular diastolic parameters are consistent with Grade I diastolic dysfunction (impaired relaxation). Right Ventricle: The right ventricular size is normal. No increase in right ventricular wall thickness. Right ventricular systolic function is normal. Tricuspid regurgitation signal is inadequate for assessing PA pressure. Left Atrium: Left atrial size was normal in size. Right Atrium: Right atrial size was normal in size. Pericardium: Trivial pericardial effusion is present. Mitral Valve: The mitral valve is grossly normal. Trivial mitral valve regurgitation. No evidence of mitral valve stenosis. Tricuspid Valve: The tricuspid valve is normal in structure. Tricuspid valve regurgitation is trivial. No evidence of tricuspid stenosis. Aortic Valve: The aortic valve is abnormal. There is moderate calcification of the aortic valve. Aortic valve regurgitation is mild. Aortic regurgitation PHT measures 559 msec. Mild to moderate aortic stenosis is present. Aortic valve mean gradient measures 19.0 mmHg. Aortic valve peak gradient measures 32.7 mmHg. Aortic valve area, by VTI measures 0.73 cm. Pulmonic Valve: The pulmonic valve was normal in structure. Pulmonic valve regurgitation is trivial. No evidence of pulmonic stenosis. Aorta: The aortic root is normal in size and structure. Venous: The inferior vena cava is normal in size with greater than 50% respiratory variability, suggesting right atrial pressure of 3 mmHg. IAS/Shunts: Cannot exclude a small PFO.  LEFT VENTRICLE PLAX 2D LVIDd:         3.40 cm   Diastology LVIDs:         2.10 cm   LV e' medial:    6.31 cm/s LV PW:         1.00 cm   LV E/e'  medial:  8.6 LV IVS:        1.30 cm   LV e' lateral:   9.68 cm/s LVOT diam:     1.70 cm    LV E/e' lateral: 5.6 LV SV:         45 LV SV Index:   32        2D Longitudinal Strain LVOT Area:     2.27 cm  2D Strain GLS Avg:     -21.9 %                           3D Volume EF:                          3D EF:        57 %                          LV EDV:       64 ml                          LV ESV:       27 ml                          LV SV:        36 ml RIGHT VENTRICLE RV Basal diam:  2.40 cm RV S prime:     17.20 cm/s TAPSE (M-mode): 1.5 cm LEFT ATRIUM             Index        RIGHT ATRIUM          Index LA diam:        2.90 cm 2.07 cm/m   RA Area:     8.36 cm LA Vol (A2C):   29.5 ml 21.03 ml/m  RA Volume:   13.30 ml 9.48 ml/m LA Vol (A4C):   23.3 ml 16.61 ml/m LA Biplane Vol: 26.9 ml 19.18 ml/m  AORTIC VALVE AV Area (Vmax):    0.71 cm AV Area (Vmean):   0.71 cm AV Area (VTI):     0.73 cm AV Vmax:           286.00 cm/s AV Vmean:          206.000 cm/s AV VTI:            0.625 m AV Peak Grad:      32.7 mmHg AV Mean Grad:      19.0 mmHg LVOT Vmax:         89.60 cm/s LVOT Vmean:        64.500 cm/s LVOT VTI:          0.200 m LVOT/AV VTI ratio: 0.32 AI PHT:            559 msec  AORTA Ao Root diam: 2.70 cm Ao Asc diam:  2.90 cm MITRAL VALVE MV Area (PHT):             SHUNTS MV Decel Time:             Systemic VTI:  0.20 m MV E velocity: 54.00 cm/s  Systemic Diam: 1.70 cm MV A velocity: 80.70 cm/s MV E/A ratio:  0.67 Weston Brass MD Electronically signed by Weston Brass MD Signature Date/Time: 08/18/2023/12:38:59 PM    Final     ------  Elmon Kirschner, NP Pager: [phone]   Please contact the urology consult pager with any further questions/concerns.

## 2023-08-19 NOTE — H&P (Signed)
DATA:  Abdominal/flank pain, stone suspected left flank, hematuria. Technologist notes state: Patient states she was referred from urgent care where she had a CT scan. EXAM: CT ABDOMEN AND PELVIS WITHOUT CONTRAST TECHNIQUE: Multidetector CT imaging of the abdomen and pelvis was performed following the standard protocol without IV contrast. RADIATION DOSE REDUCTION: This exam was performed according to the departmental dose-optimization program which includes automated exposure control, adjustment of the mA and/or kV according to patient size and/or use of iterative reconstruction technique. COMPARISON:  CT 09/15/2021 FINDINGS: Lower chest: Clear lung bases.  Coronary artery calcifications Hepatobiliary: Chronic calcified granuloma anterior to the left hepatic lobe. Unenhanced liver otherwise unremarkable. Gallbladder physiologically distended, no calcified stone. No biliary dilatation. Pancreas: No ductal dilatation or inflammation. Spleen: Normal in size without focal abnormality. Adrenals/Urinary  Tract: No adrenal nodule. Left hydroureteronephrosis is in part chronic, but progressed from prior exam. Left ureteral dilatation is also chronic, but mildly progressed. Low insertion of the ureter in the bladder in the low pelvis. No renal ureteral calculi. There may be some soft tissue thickening in the region of the ureteral insertion of the bladder, although not well assessed on this unenhanced exam. Insert prominence of the right renal collecting system without hydronephrosis. No right hydroureter. The small hypodense lesions in the right kidney on prior grossly stable, but not as well delineated on the current exam in the absence of contrast. No further follow-up imaging is recommended. Bladder slightly deviated into the right pelvis, low lying. No bladder stone or bladder inflammation. Stomach/Bowel: The stomach is nondistended. Small posterior gastric diverticulum without inflammation. 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 Small bowel is otherwise unremarkable without obstruction or inflammation. The appendix is not well-defined on the current exam. No evidence of appendicitis. Small to moderate volume of colonic stool. No colonic inflammation. Vascular/Lymphatic: Aortic and branch atherosclerosis. No aneurysm. No bulky abdominopelvic adenopathy. Reproductive: Status post hysterectomy. No adnexal masses. Other: No ascites.  Small fat containing bilateral inguinal hernias. Musculoskeletal: Scoliosis and degenerative change in the spine. There are no acute or suspicious osseous abnormalities. IMPRESSION: 1. Left hydroureteronephrosis is in part chronic, but progressed from November 2023 exam. Left ureteral dilatation is also chronic, but mildly progressed. The left ureteral insertion is low within the bladder. There may be some soft tissue thickening in the region of the ureteral insertion, not well assessed on this unenhanced exam. Recommend urologic follow-up. 2. No urolithiasis. Aortic  Atherosclerosis (ICD10-I70.0). Electronically Signed   By: Narda Rutherford M.D.   On: 08/18/2023 21:35   ECHOCARDIOGRAM COMPLETE  Result Date: 08/18/2023    ECHOCARDIOGRAM REPORT   Patient Name:   Gina Harrell Date of Exam: 08/18/2023 Medical Rec #:  [id number]         Height:       59.0 in Accession #:    [id number]        Weight:       105.0 lb Date of Birth:  January 26, 1941        BSA:          1.403 m Patient Age:    81 years          BP:           159/78 mmHg Patient Gender: F                 HR:           71 bpm. Exam Location:  [address] Procedure: 2D Echo, 3D Echo, Cardiac Doppler, Color  History and Physical    Patient: Gina Harrell:[id number] DOB: [birth year] DOA: 08/18/2023 DOS: the patient was seen and examined on 08/19/2023 PCP: [name], MD  Patient coming from: Home  Chief Complaint:  Chief Complaint  Patient presents with   Hematuria   HPI: BRISEYDA FEHR is a 82 y.o. female with medical history significant of bladder cancer diagnosed 22 years ago, long term remission after excision.  HTN, aortic stenosis, carotid stenosis.  Pt in to ED with onset of hematuria with clots earlier in day, L sided flank pain.  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  In ED: Febrile to 100.6, WBC 15.8k  UA c/w UTI  CT scan shows L hydronephrosis once again.  Though maybe slightly progressed from 2 years ago.  Urology called by EDP, but felt that pt didn't require urgent intervention given chronicity of hydronephrosis.  Pt given rocephin and hospitalist asked to admit.  Review of Systems: As mentioned in the history of present illness. All other systems reviewed and are negative. Past Medical History:  Diagnosis Date   Cancer [facility])    Bladder cancer   Elevated cholesterol    GERD (gastroesophageal reflux disease)    Heart murmur    Heart murmur    Hypertension    Hypothyroidism    Osteoporosis    Uterine prolapse    Past Surgical History:  Procedure Laterality Date   BLADDER TUMOR EXCISION     BREAST EXCISIONAL BIOPSY Right    CAROTID DOPPLER  2013   COLONOSCOPY WITH PROPOFOL N/A 02/17/2016   Procedure: COLONOSCOPY WITH PROPOFOL;  Surgeon: Charolett Bumpers, MD;  Location: [facility];  Service: Endoscopy;  Laterality: N/A;   DOPPLER ECHOCARDIOGRAPHY  2013   ESOPHAGOGASTRODUODENOSCOPY (EGD) WITH PROPOFOL N/A 02/17/2016   Procedure: ESOPHAGOGASTRODUODENOSCOPY (EGD) WITH PROPOFOL;  Surgeon: Charolett Bumpers, MD;  Location: [facility];  Service: Endoscopy;  Laterality: N/A;    VAGINAL HYSTERECTOMY     Social History:  reports that she has been smoking cigarettes. She has a 25 pack-year smoking history. She has never used smokeless tobacco. She reports current alcohol use of about 7.0 standard drinks of alcohol per week. She reports that she does not use drugs.  No Known Allergies  Family History  Problem Relation Age of Onset   Cancer Mother        Colon cancer   Hypertension Mother    Heart disease Father    Breast cancer Maternal Aunt        Age 31's   Diabetes Maternal Grandmother     Prior to Admission medications   Medication Sig Start Date End Date Taking? Authorizing Provider  albuterol (VENTOLIN HFA) 108 (90 Base) MCG/ACT inhaler Inhale 2 puffs into the lungs every 4 (four) hours as needed. 06/04/23  Yes [provider]  ALPRAZolam Prudy Feeler) 0.5 MG tablet Take 0.5 mg by mouth at bedtime.    Yes [provider]  Cholecalciferol (VITAMIN D-3) 1000 UNITS CAPS Take 1 capsule by mouth daily. Reported on 12/26/2015   Yes [provider]  ciprofloxacin (CIPRO) 250 MG tablet Take 250 mg by mouth 2 (two) times daily. 08/18/23  Yes [provider]  CRANBERRY PO Take 2 capsules by mouth daily.   Yes [provider]  diphenhydramine-acetaminophen (TYLENOL PM) 25-500 MG TABS Take 1 tablet by mouth at bedtime. Reported on 12/26/2015   Yes [provider]  escitalopram (LEXAPRO)  History and Physical    Patient: Gina Harrell:[id number] DOB: [birth year] DOA: 08/18/2023 DOS: the patient was seen and examined on 08/19/2023 PCP: [name], MD  Patient coming from: Home  Chief Complaint:  Chief Complaint  Patient presents with   Hematuria   HPI: BRISEYDA FEHR is a 82 y.o. female with medical history significant of bladder cancer diagnosed 22 years ago, long term remission after excision.  HTN, aortic stenosis, carotid stenosis.  Pt in to ED with onset of hematuria with clots earlier in day, L sided flank pain.  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  In ED: Febrile to 100.6, WBC 15.8k  UA c/w UTI  CT scan shows L hydronephrosis once again.  Though maybe slightly progressed from 2 years ago.  Urology called by EDP, but felt that pt didn't require urgent intervention given chronicity of hydronephrosis.  Pt given rocephin and hospitalist asked to admit.  Review of Systems: As mentioned in the history of present illness. All other systems reviewed and are negative. Past Medical History:  Diagnosis Date   Cancer [facility])    Bladder cancer   Elevated cholesterol    GERD (gastroesophageal reflux disease)    Heart murmur    Heart murmur    Hypertension    Hypothyroidism    Osteoporosis    Uterine prolapse    Past Surgical History:  Procedure Laterality Date   BLADDER TUMOR EXCISION     BREAST EXCISIONAL BIOPSY Right    CAROTID DOPPLER  2013   COLONOSCOPY WITH PROPOFOL N/A 02/17/2016   Procedure: COLONOSCOPY WITH PROPOFOL;  Surgeon: Charolett Bumpers, MD;  Location: [facility];  Service: Endoscopy;  Laterality: N/A;   DOPPLER ECHOCARDIOGRAPHY  2013   ESOPHAGOGASTRODUODENOSCOPY (EGD) WITH PROPOFOL N/A 02/17/2016   Procedure: ESOPHAGOGASTRODUODENOSCOPY (EGD) WITH PROPOFOL;  Surgeon: Charolett Bumpers, MD;  Location: [facility];  Service: Endoscopy;  Laterality: N/A;    VAGINAL HYSTERECTOMY     Social History:  reports that she has been smoking cigarettes. She has a 25 pack-year smoking history. She has never used smokeless tobacco. She reports current alcohol use of about 7.0 standard drinks of alcohol per week. She reports that she does not use drugs.  No Known Allergies  Family History  Problem Relation Age of Onset   Cancer Mother        Colon cancer   Hypertension Mother    Heart disease Father    Breast cancer Maternal Aunt        Age 31's   Diabetes Maternal Grandmother     Prior to Admission medications   Medication Sig Start Date End Date Taking? Authorizing Provider  albuterol (VENTOLIN HFA) 108 (90 Base) MCG/ACT inhaler Inhale 2 puffs into the lungs every 4 (four) hours as needed. 06/04/23  Yes [provider]  ALPRAZolam Prudy Feeler) 0.5 MG tablet Take 0.5 mg by mouth at bedtime.    Yes [provider]  Cholecalciferol (VITAMIN D-3) 1000 UNITS CAPS Take 1 capsule by mouth daily. Reported on 12/26/2015   Yes [provider]  ciprofloxacin (CIPRO) 250 MG tablet Take 250 mg by mouth 2 (two) times daily. 08/18/23  Yes [provider]  CRANBERRY PO Take 2 capsules by mouth daily.   Yes [provider]  diphenhydramine-acetaminophen (TYLENOL PM) 25-500 MG TABS Take 1 tablet by mouth at bedtime. Reported on 12/26/2015   Yes [provider]  escitalopram (LEXAPRO)  History and Physical    Patient: Gina Harrell:[id number] DOB: [birth year] DOA: 08/18/2023 DOS: the patient was seen and examined on 08/19/2023 PCP: [name], MD  Patient coming from: Home  Chief Complaint:  Chief Complaint  Patient presents with   Hematuria   HPI: BRISEYDA FEHR is a 82 y.o. female with medical history significant of bladder cancer diagnosed 22 years ago, long term remission after excision.  HTN, aortic stenosis, carotid stenosis.  Pt in to ED with onset of hematuria with clots earlier in day, L sided flank pain.  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  In ED: Febrile to 100.6, WBC 15.8k  UA c/w UTI  CT scan shows L hydronephrosis once again.  Though maybe slightly progressed from 2 years ago.  Urology called by EDP, but felt that pt didn't require urgent intervention given chronicity of hydronephrosis.  Pt given rocephin and hospitalist asked to admit.  Review of Systems: As mentioned in the history of present illness. All other systems reviewed and are negative. Past Medical History:  Diagnosis Date   Cancer [facility])    Bladder cancer   Elevated cholesterol    GERD (gastroesophageal reflux disease)    Heart murmur    Heart murmur    Hypertension    Hypothyroidism    Osteoporosis    Uterine prolapse    Past Surgical History:  Procedure Laterality Date   BLADDER TUMOR EXCISION     BREAST EXCISIONAL BIOPSY Right    CAROTID DOPPLER  2013   COLONOSCOPY WITH PROPOFOL N/A 02/17/2016   Procedure: COLONOSCOPY WITH PROPOFOL;  Surgeon: Charolett Bumpers, MD;  Location: [facility];  Service: Endoscopy;  Laterality: N/A;   DOPPLER ECHOCARDIOGRAPHY  2013   ESOPHAGOGASTRODUODENOSCOPY (EGD) WITH PROPOFOL N/A 02/17/2016   Procedure: ESOPHAGOGASTRODUODENOSCOPY (EGD) WITH PROPOFOL;  Surgeon: Charolett Bumpers, MD;  Location: [facility];  Service: Endoscopy;  Laterality: N/A;    VAGINAL HYSTERECTOMY     Social History:  reports that she has been smoking cigarettes. She has a 25 pack-year smoking history. She has never used smokeless tobacco. She reports current alcohol use of about 7.0 standard drinks of alcohol per week. She reports that she does not use drugs.  No Known Allergies  Family History  Problem Relation Age of Onset   Cancer Mother        Colon cancer   Hypertension Mother    Heart disease Father    Breast cancer Maternal Aunt        Age 31's   Diabetes Maternal Grandmother     Prior to Admission medications   Medication Sig Start Date End Date Taking? Authorizing Provider  albuterol (VENTOLIN HFA) 108 (90 Base) MCG/ACT inhaler Inhale 2 puffs into the lungs every 4 (four) hours as needed. 06/04/23  Yes [provider]  ALPRAZolam Prudy Feeler) 0.5 MG tablet Take 0.5 mg by mouth at bedtime.    Yes [provider]  Cholecalciferol (VITAMIN D-3) 1000 UNITS CAPS Take 1 capsule by mouth daily. Reported on 12/26/2015   Yes [provider]  ciprofloxacin (CIPRO) 250 MG tablet Take 250 mg by mouth 2 (two) times daily. 08/18/23  Yes [provider]  CRANBERRY PO Take 2 capsules by mouth daily.   Yes [provider]  diphenhydramine-acetaminophen (TYLENOL PM) 25-500 MG TABS Take 1 tablet by mouth at bedtime. Reported on 12/26/2015   Yes [provider]  escitalopram (LEXAPRO)  DATA:  Abdominal/flank pain, stone suspected left flank, hematuria. Technologist notes state: Patient states she was referred from urgent care where she had a CT scan. EXAM: CT ABDOMEN AND PELVIS WITHOUT CONTRAST TECHNIQUE: Multidetector CT imaging of the abdomen and pelvis was performed following the standard protocol without IV contrast. RADIATION DOSE REDUCTION: This exam was performed according to the departmental dose-optimization program which includes automated exposure control, adjustment of the mA and/or kV according to patient size and/or use of iterative reconstruction technique. COMPARISON:  CT 09/15/2021 FINDINGS: Lower chest: Clear lung bases.  Coronary artery calcifications Hepatobiliary: Chronic calcified granuloma anterior to the left hepatic lobe. Unenhanced liver otherwise unremarkable. Gallbladder physiologically distended, no calcified stone. No biliary dilatation. Pancreas: No ductal dilatation or inflammation. Spleen: Normal in size without focal abnormality. Adrenals/Urinary  Tract: No adrenal nodule. Left hydroureteronephrosis is in part chronic, but progressed from prior exam. Left ureteral dilatation is also chronic, but mildly progressed. Low insertion of the ureter in the bladder in the low pelvis. No renal ureteral calculi. There may be some soft tissue thickening in the region of the ureteral insertion of the bladder, although not well assessed on this unenhanced exam. Insert prominence of the right renal collecting system without hydronephrosis. No right hydroureter. The small hypodense lesions in the right kidney on prior grossly stable, but not as well delineated on the current exam in the absence of contrast. No further follow-up imaging is recommended. Bladder slightly deviated into the right pelvis, low lying. No bladder stone or bladder inflammation. Stomach/Bowel: The stomach is nondistended. Small posterior gastric diverticulum without inflammation. 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 Small bowel is otherwise unremarkable without obstruction or inflammation. The appendix is not well-defined on the current exam. No evidence of appendicitis. Small to moderate volume of colonic stool. No colonic inflammation. Vascular/Lymphatic: Aortic and branch atherosclerosis. No aneurysm. No bulky abdominopelvic adenopathy. Reproductive: Status post hysterectomy. No adnexal masses. Other: No ascites.  Small fat containing bilateral inguinal hernias. Musculoskeletal: Scoliosis and degenerative change in the spine. There are no acute or suspicious osseous abnormalities. IMPRESSION: 1. Left hydroureteronephrosis is in part chronic, but progressed from November 2023 exam. Left ureteral dilatation is also chronic, but mildly progressed. The left ureteral insertion is low within the bladder. There may be some soft tissue thickening in the region of the ureteral insertion, not well assessed on this unenhanced exam. Recommend urologic follow-up. 2. No urolithiasis. Aortic  Atherosclerosis (ICD10-I70.0). Electronically Signed   By: Narda Rutherford M.D.   On: 08/18/2023 21:35   ECHOCARDIOGRAM COMPLETE  Result Date: 08/18/2023    ECHOCARDIOGRAM REPORT   Patient Name:   Gina Harrell Date of Exam: 08/18/2023 Medical Rec #:  [id number]         Height:       59.0 in Accession #:    [id number]        Weight:       105.0 lb Date of Birth:  January 26, 1941        BSA:          1.403 m Patient Age:    81 years          BP:           159/78 mmHg Patient Gender: F                 HR:           71 bpm. Exam Location:  [address] Procedure: 2D Echo, 3D Echo, Cardiac Doppler, Color  History and Physical    Patient: Gina Harrell:[id number] DOB: [birth year] DOA: 08/18/2023 DOS: the patient was seen and examined on 08/19/2023 PCP: [name], MD  Patient coming from: Home  Chief Complaint:  Chief Complaint  Patient presents with   Hematuria   HPI: BRISEYDA FEHR is a 82 y.o. female with medical history significant of bladder cancer diagnosed 22 years ago, long term remission after excision.  HTN, aortic stenosis, carotid stenosis.  Pt in to ED with onset of hematuria with clots earlier in day, L sided flank pain.  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  In ED: Febrile to 100.6, WBC 15.8k  UA c/w UTI  CT scan shows L hydronephrosis once again.  Though maybe slightly progressed from 2 years ago.  Urology called by EDP, but felt that pt didn't require urgent intervention given chronicity of hydronephrosis.  Pt given rocephin and hospitalist asked to admit.  Review of Systems: As mentioned in the history of present illness. All other systems reviewed and are negative. Past Medical History:  Diagnosis Date   Cancer [facility])    Bladder cancer   Elevated cholesterol    GERD (gastroesophageal reflux disease)    Heart murmur    Heart murmur    Hypertension    Hypothyroidism    Osteoporosis    Uterine prolapse    Past Surgical History:  Procedure Laterality Date   BLADDER TUMOR EXCISION     BREAST EXCISIONAL BIOPSY Right    CAROTID DOPPLER  2013   COLONOSCOPY WITH PROPOFOL N/A 02/17/2016   Procedure: COLONOSCOPY WITH PROPOFOL;  Surgeon: Charolett Bumpers, MD;  Location: [facility];  Service: Endoscopy;  Laterality: N/A;   DOPPLER ECHOCARDIOGRAPHY  2013   ESOPHAGOGASTRODUODENOSCOPY (EGD) WITH PROPOFOL N/A 02/17/2016   Procedure: ESOPHAGOGASTRODUODENOSCOPY (EGD) WITH PROPOFOL;  Surgeon: Charolett Bumpers, MD;  Location: [facility];  Service: Endoscopy;  Laterality: N/A;    VAGINAL HYSTERECTOMY     Social History:  reports that she has been smoking cigarettes. She has a 25 pack-year smoking history. She has never used smokeless tobacco. She reports current alcohol use of about 7.0 standard drinks of alcohol per week. She reports that she does not use drugs.  No Known Allergies  Family History  Problem Relation Age of Onset   Cancer Mother        Colon cancer   Hypertension Mother    Heart disease Father    Breast cancer Maternal Aunt        Age 31's   Diabetes Maternal Grandmother     Prior to Admission medications   Medication Sig Start Date End Date Taking? Authorizing Provider  albuterol (VENTOLIN HFA) 108 (90 Base) MCG/ACT inhaler Inhale 2 puffs into the lungs every 4 (four) hours as needed. 06/04/23  Yes [provider]  ALPRAZolam Prudy Feeler) 0.5 MG tablet Take 0.5 mg by mouth at bedtime.    Yes [provider]  Cholecalciferol (VITAMIN D-3) 1000 UNITS CAPS Take 1 capsule by mouth daily. Reported on 12/26/2015   Yes [provider]  ciprofloxacin (CIPRO) 250 MG tablet Take 250 mg by mouth 2 (two) times daily. 08/18/23  Yes [provider]  CRANBERRY PO Take 2 capsules by mouth daily.   Yes [provider]  diphenhydramine-acetaminophen (TYLENOL PM) 25-500 MG TABS Take 1 tablet by mouth at bedtime. Reported on 12/26/2015   Yes [provider]  escitalopram (LEXAPRO)

## 2023-08-19 NOTE — Plan of Care (Signed)
Discuss and review plan of care with patient/family  

## 2023-08-19 NOTE — Plan of Care (Signed)
  Problem: Education: Goal: Knowledge of General Education information will improve Description: Including pain rating scale, medication(s)/side effects and non-pharmacologic comfort measures Outcome: Progressing   Problem: Clinical Measurements: Goal: Ability to maintain clinical measurements within normal limits will improve Outcome: Progressing Goal: Cardiovascular complication will be avoided Outcome: Progressing   Problem: Activity: Goal: Risk for activity intolerance will decrease Outcome: Progressing   Problem: Nutrition: Goal: Adequate nutrition will be maintained Outcome: Progressing   

## 2023-08-19 NOTE — Assessment & Plan Note (Addendum)
H/o bladder CA 22 years ago, long term remission / non-progression it seems.  Followed by Dr. Liliane Shi.  Presumably Urology will look more at this when they see pt in consult for the UTI / hydronephrosis / hematuria as above.

## 2023-08-20 ENCOUNTER — Other Ambulatory Visit (HOSPITAL_COMMUNITY): Payer: Self-pay

## 2023-08-20 DIAGNOSIS — N12 Tubulo-interstitial nephritis, not specified as acute or chronic: Secondary | ICD-10-CM | POA: Diagnosis not present

## 2023-08-20 DIAGNOSIS — A419 Sepsis, unspecified organism: Secondary | ICD-10-CM | POA: Diagnosis not present

## 2023-08-20 DIAGNOSIS — Z720 Tobacco use: Secondary | ICD-10-CM

## 2023-08-20 LAB — BASIC METABOLIC PANEL
Anion gap: 9 (ref 5–15)
BUN: 20 mg/dL (ref 8–23)
CO2: 22 mmol/L (ref 22–32)
Calcium: 8.5 mg/dL — ABNORMAL LOW (ref 8.9–10.3)
Chloride: 105 mmol/L (ref 98–111)
Creatinine, Ser: 0.9 mg/dL (ref 0.44–1.00)
GFR, Estimated: >60 mL/min (ref >60)
Glucose, Bld: 112 mg/dL — ABNORMAL HIGH (ref 70–99)
Potassium: 3.8 mmol/L (ref 3.5–5.1)
Sodium: 136 mmol/L (ref 135–145)

## 2023-08-20 LAB — CBC
HCT: 42.9 % (ref 36.0–46.0)
Hemoglobin: 13.3 g/dL (ref 12.0–15.0)
MCH: 29.9 pg (ref 26.0–34.0)
MCHC: 31 g/dL (ref 30.0–36.0)
MCV: 96.4 fL (ref 80.0–100.0)
Platelets: 119 10*3/uL — ABNORMAL LOW (ref 150–400)
RBC: 4.45 MIL/uL (ref 3.87–5.11)
RDW: 12.5 % (ref 11.5–15.5)
WBC: 6.3 10*3/uL (ref 4.0–10.5)
nRBC: 0 % (ref 0.0–0.2)

## 2023-08-20 LAB — URINE CULTURE: Culture: <10000 — AB

## 2023-08-20 MED ORDER — CEFDINIR 300 MG PO CAPS
300.0000 mg | ORAL_CAPSULE | Freq: Two times a day (BID) | ORAL | Status: DC
  Administered 2023-08-20: 300 mg via ORAL
  Filled 2023-08-20: qty 1

## 2023-08-20 MED ORDER — CEPHALEXIN 500 MG PO CAPS
500.0000 mg | ORAL_CAPSULE | Freq: Two times a day (BID) | ORAL | 0 refills | Status: AC
  Filled 2023-08-20: qty 14, 7d supply, fill #0

## 2023-08-20 MED ORDER — ONDANSETRON HCL 4 MG PO TABS
4.0000 mg | ORAL_TABLET | Freq: Four times a day (QID) | ORAL | 0 refills | Status: DC | PRN
  Filled 2023-08-20: qty 20, 5d supply, fill #0

## 2023-08-20 MED ORDER — LOSARTAN POTASSIUM 25 MG PO TABS
25.0000 mg | ORAL_TABLET | Freq: Every day | ORAL | 3 refills | Status: DC
  Filled 2023-08-20: qty 30, 30d supply, fill #0
  Filled 2023-09-15: qty 30, 30d supply, fill #1
  Filled 2023-10-16: qty 30, 30d supply, fill #2
  Filled 2023-11-13: qty 30, 30d supply, fill #3

## 2023-08-20 MED ORDER — LOSARTAN POTASSIUM 25 MG PO TABS
25.0000 mg | ORAL_TABLET | Freq: Every day | ORAL | Status: DC
  Administered 2023-08-20: 25 mg via ORAL
  Filled 2023-08-20: qty 1

## 2023-08-20 MED ORDER — CEFDINIR 300 MG PO CAPS
300.0000 mg | ORAL_CAPSULE | Freq: Two times a day (BID) | ORAL | 0 refills | Status: DC
  Filled 2023-08-20: qty 14, 7d supply, fill #0

## 2023-08-20 NOTE — Discharge Summary (Signed)
Normal in size without focal abnormality. Adrenals/Urinary Tract: No adrenal nodule. Left hydroureteronephrosis is in part chronic, but progressed from prior exam. Left ureteral dilatation is also chronic, but mildly progressed. Low insertion of the ureter in the bladder in the low pelvis. No renal ureteral calculi. There may be some soft tissue thickening in the region of the ureteral insertion of the bladder, although not well assessed on this unenhanced exam. Insert prominence of the right renal collecting system without hydronephrosis. No right hydroureter. The small hypodense lesions in the right kidney on prior grossly  stable, but not as well delineated on the current exam in the absence of contrast. No further follow-up imaging is recommended. Bladder slightly deviated into the right pelvis, low lying. No bladder stone or bladder inflammation. Stomach/Bowel: The stomach is nondistended. Small posterior gastric diverticulum without inflammation. 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 Small bowel is otherwise unremarkable without obstruction or inflammation. The appendix is not well-defined on the current exam. No evidence of appendicitis. Small to moderate volume of colonic stool. No colonic inflammation. Vascular/Lymphatic: Aortic and branch atherosclerosis. No aneurysm. No bulky abdominopelvic adenopathy. Reproductive: Status post hysterectomy. No adnexal masses. Other: No ascites.  Small fat containing bilateral inguinal hernias. Musculoskeletal: Scoliosis and degenerative change in the spine. There are no acute or suspicious osseous abnormalities. IMPRESSION: 1. Left hydroureteronephrosis is in part chronic, but progressed from November 2023 exam. Left ureteral dilatation is also chronic, but mildly progressed. The left ureteral insertion is low within the bladder. There may be some soft tissue thickening in the region of the ureteral insertion, not well assessed on this unenhanced exam. Recommend urologic follow-up. 2. No urolithiasis. Aortic Atherosclerosis (ICD10-I70.0). Electronically Signed   By: Narda Rutherford M.D.   On: 08/18/2023 21:35   ECHOCARDIOGRAM COMPLETE  Result Date: 08/18/2023    ECHOCARDIOGRAM REPORT   Patient Name:   Gina Harrell Date of Exam: 08/18/2023 Medical Rec #:  [id number]         Height:       59.0 in Accession #:    [id number]        Weight:       105.0 lb Date of Birth:  [birth year]        BSA:          1.403 m Patient Age:    81 years          BP:           159/78 mmHg Patient Gender: F                 HR:           71 bpm. Exam Location:  [address] Procedure: 2D Echo, 3D Echo,  Cardiac Doppler, Color Doppler and Strain Analysis Indications:    I35.0 Nonrheumatic aortic (valve) stenosis  History:        Patient has prior history of Echocardiogram examinations, most                 recent 07/11/2020. Risk Factors:Hypertension and HLD.  Sonographer:    Clearence Ped RCS Referring Phys: [phone] [facility] CROITORU IMPRESSIONS  1. Left ventricular ejection fraction, by estimation, is 65 to 70%. The left ventricle has normal function. The left ventricle has no regional wall motion abnormalities. There is moderate asymmetric left ventricular hypertrophy of the septal segment. Left ventricular diastolic parameters are consistent with Grade I diastolic dysfunction (impaired relaxation). The average left ventricular global longitudinal strain is -21.9 %. The global  3D EF:        57 %                          LV EDV:       64 ml                          LV ESV:       27 ml                          LV SV:        36 ml RIGHT VENTRICLE RV Basal diam:  2.40 cm RV S prime:     17.20 cm/s TAPSE (M-mode): 1.5 cm LEFT ATRIUM             Index        RIGHT ATRIUM          Index LA diam:        2.90 cm 2.07 cm/m   RA Area:     8.36 cm LA Vol (A2C):   29.5 ml 21.03 ml/m  RA Volume:   13.30 ml 9.48 ml/m LA Vol (A4C):   23.3 ml 16.61 ml/m LA Biplane Vol: 26.9 ml 19.18 ml/m  AORTIC VALVE AV Area (Vmax):    0.71 cm AV Area (Vmean):   0.71 cm AV Area (VTI):     0.73 cm AV Vmax:           286.00 cm/s AV Vmean:          206.000 cm/s AV VTI:            0.625 m AV Peak Grad:      32.7 mmHg AV Mean Grad:      19.0 mmHg LVOT Vmax:         89.60 cm/s LVOT Vmean:        64.500 cm/s LVOT VTI:          0.200 m LVOT/AV VTI ratio: 0.32 AI PHT:            559 msec  AORTA Ao Root diam: 2.70 cm Ao Asc diam:  2.90 cm MITRAL VALVE MV Area (PHT):             SHUNTS MV Decel Time:             Systemic VTI:  0.20 m MV E velocity: 54.00 cm/s  Systemic Diam: 1.70 cm MV A velocity: 80.70 cm/s MV E/A ratio:  0.67 Weston Brass MD Electronically signed by Weston Brass MD Signature Date/Time: 08/18/2023/12:38:59 PM    Final    VAS US CAROTID  Result Date: 08/02/2023 Carotid Arterial Duplex Study Patient Name:  Gina Harrell  Date of Exam:   07/26/2023 Medical Rec #: [id number]          Accession #:    [id number] Date of Birth: 25-Mar-1941         Patient Gender: F Patient Age:   84 years Exam Location:  [facility] Procedure:      VAS US CAROTID Referring Phys: [facility]OIT[facility] --------------------------------------------------------------------------------  Indications:       Carotid artery disease and patient denies any cerebrovascular                     symptoms. Risk Factors:      Hypertension, hyperlipidemia, current smoker. Comparison Study:  07/2022, a carotid  Normal in size without focal abnormality. Adrenals/Urinary Tract: No adrenal nodule. Left hydroureteronephrosis is in part chronic, but progressed from prior exam. Left ureteral dilatation is also chronic, but mildly progressed. Low insertion of the ureter in the bladder in the low pelvis. No renal ureteral calculi. There may be some soft tissue thickening in the region of the ureteral insertion of the bladder, although not well assessed on this unenhanced exam. Insert prominence of the right renal collecting system without hydronephrosis. No right hydroureter. The small hypodense lesions in the right kidney on prior grossly  stable, but not as well delineated on the current exam in the absence of contrast. No further follow-up imaging is recommended. Bladder slightly deviated into the right pelvis, low lying. No bladder stone or bladder inflammation. Stomach/Bowel: The stomach is nondistended. Small posterior gastric diverticulum without inflammation. 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 Small bowel is otherwise unremarkable without obstruction or inflammation. The appendix is not well-defined on the current exam. No evidence of appendicitis. Small to moderate volume of colonic stool. No colonic inflammation. Vascular/Lymphatic: Aortic and branch atherosclerosis. No aneurysm. No bulky abdominopelvic adenopathy. Reproductive: Status post hysterectomy. No adnexal masses. Other: No ascites.  Small fat containing bilateral inguinal hernias. Musculoskeletal: Scoliosis and degenerative change in the spine. There are no acute or suspicious osseous abnormalities. IMPRESSION: 1. Left hydroureteronephrosis is in part chronic, but progressed from November 2023 exam. Left ureteral dilatation is also chronic, but mildly progressed. The left ureteral insertion is low within the bladder. There may be some soft tissue thickening in the region of the ureteral insertion, not well assessed on this unenhanced exam. Recommend urologic follow-up. 2. No urolithiasis. Aortic Atherosclerosis (ICD10-I70.0). Electronically Signed   By: Narda Rutherford M.D.   On: 08/18/2023 21:35   ECHOCARDIOGRAM COMPLETE  Result Date: 08/18/2023    ECHOCARDIOGRAM REPORT   Patient Name:   Gina Harrell Date of Exam: 08/18/2023 Medical Rec #:  [id number]         Height:       59.0 in Accession #:    [id number]        Weight:       105.0 lb Date of Birth:  [birth year]        BSA:          1.403 m Patient Age:    81 years          BP:           159/78 mmHg Patient Gender: F                 HR:           71 bpm. Exam Location:  [address] Procedure: 2D Echo, 3D Echo,  Cardiac Doppler, Color Doppler and Strain Analysis Indications:    I35.0 Nonrheumatic aortic (valve) stenosis  History:        Patient has prior history of Echocardiogram examinations, most                 recent 07/11/2020. Risk Factors:Hypertension and HLD.  Sonographer:    Clearence Ped RCS Referring Phys: [phone] [facility] CROITORU IMPRESSIONS  1. Left ventricular ejection fraction, by estimation, is 65 to 70%. The left ventricle has normal function. The left ventricle has no regional wall motion abnormalities. There is moderate asymmetric left ventricular hypertrophy of the septal segment. Left ventricular diastolic parameters are consistent with Grade I diastolic dysfunction (impaired relaxation). The average left ventricular global longitudinal strain is -21.9 %. The global  3D EF:        57 %                          LV EDV:       64 ml                          LV ESV:       27 ml                          LV SV:        36 ml RIGHT VENTRICLE RV Basal diam:  2.40 cm RV S prime:     17.20 cm/s TAPSE (M-mode): 1.5 cm LEFT ATRIUM             Index        RIGHT ATRIUM          Index LA diam:        2.90 cm 2.07 cm/m   RA Area:     8.36 cm LA Vol (A2C):   29.5 ml 21.03 ml/m  RA Volume:   13.30 ml 9.48 ml/m LA Vol (A4C):   23.3 ml 16.61 ml/m LA Biplane Vol: 26.9 ml 19.18 ml/m  AORTIC VALVE AV Area (Vmax):    0.71 cm AV Area (Vmean):   0.71 cm AV Area (VTI):     0.73 cm AV Vmax:           286.00 cm/s AV Vmean:          206.000 cm/s AV VTI:            0.625 m AV Peak Grad:      32.7 mmHg AV Mean Grad:      19.0 mmHg LVOT Vmax:         89.60 cm/s LVOT Vmean:        64.500 cm/s LVOT VTI:          0.200 m LVOT/AV VTI ratio: 0.32 AI PHT:            559 msec  AORTA Ao Root diam: 2.70 cm Ao Asc diam:  2.90 cm MITRAL VALVE MV Area (PHT):             SHUNTS MV Decel Time:             Systemic VTI:  0.20 m MV E velocity: 54.00 cm/s  Systemic Diam: 1.70 cm MV A velocity: 80.70 cm/s MV E/A ratio:  0.67 Weston Brass MD Electronically signed by Weston Brass MD Signature Date/Time: 08/18/2023/12:38:59 PM    Final    VAS US CAROTID  Result Date: 08/02/2023 Carotid Arterial Duplex Study Patient Name:  Gina Harrell  Date of Exam:   07/26/2023 Medical Rec #: [id number]          Accession #:    [id number] Date of Birth: 25-Mar-1941         Patient Gender: F Patient Age:   84 years Exam Location:  [facility] Procedure:      VAS US CAROTID Referring Phys: [facility] CROIT[facility]U --------------------------------------------------------------------------------  Indications:       Carotid artery disease and patient denies any cerebrovascular                     symptoms. Risk Factors:      Hypertension, hyperlipidemia, current smoker. Comparison Study:  07/2022, a carotid  Physician Discharge Summary   Patient: Gina Harrell MRN: [id number] DOB: 10-21-1941  Admit date:     08/18/2023  Discharge date: 08/20/23  Discharge Physician: Jerome Viglione. MD   PCP: [name], MD   Recommendations at discharge:   Patient will follow-up with Dr. Doreatha Lew to schedule cystoscopy and further workup for chronic hydronephrosis Continue Keflex 500 mg twice daily for 7 more days Triamterene HCTZ discontinued (severe hypokalemia on admission)  Discharge Diagnoses:    Pyelonephritis    History of bladder cancer   Hydronephrosis of left kidney   HTN (hypertension)   Aortic stenosis   UTI (urinary tract infection)   Hypothyroidism   Sepsis (HCC), POA    Hospital Course:  Patient is a 83 year old female with history of bladder cancer, diagnosed 22 years ago, long-term remission after excision and chemotherapy, HTN, aortic stenosis, carotid stenosis presented to ED with hematuria with clots and left-sided flank pain.  In ED, patient was noted to be febrile 100.6 F, WBCs 15.8 K.  UA showed UTI. CT scan showed left-sided hydronephrosis, slightly progressed from 2 years ago. Patient was placed on IV antibiotics and admitted for further workup.   Assessment and Plan: Left-sided pyelonephritis, complicated UTI, left-sided hydronephrosis -Sepsis ruled out -In the setting of chronic left-sided hydronephrosis, progressed from 2 years ago, history of bladder CA -Follow blood cultures and sensitivities, urine culture and sensitivities (unfortunately were not collected prior to initiating antibiotics in ED) -Patient was placed on IV Rocephin -Urology consulted, appreciate recommendations, could be hemorrhagic cystitis from chronic cystitis or recurrent bladder CA. -She was cleared by urology for discharge. Dr Sande Brothers was notified in clinic will contact her to schedule cystoscopy and further workup. Continue Keflex 500 mg twice daily for 7 more days      History of bladder cancer, lateral wall of urinary bladder and bladder neck -Per patient, in remission after excision and chemotherapy, >20 years ago.  Used to follow Dr. Patsi Sears and now Dr. Liliane Shi.        HTN (hypertension) -Triamterene/HCTZ discontinued Placed on the losartan 25 mg daily     Aortic stenosis -Mild to moderate, no acute issues     Hypothyroidism -Continue Synthroid  Hyponatremia, hypokalemia -Patient presented with severe hypokalemia on admission, 2.7, sodium 127 -Improved, potassium 3.8 on discharge, sodium 136 -Triamterene, HCTZ discontinued    Estimated body mass index is 20.93 kg/m as calculated from the following:   Height as of this encounter: 4\' 11"  (1.499 m).   Weight as of this encounter: 47 kg.     Pain control - [employer] Controlled Substance Reporting System database was reviewed. and patient was instructed, not to drive, operate heavy machinery, perform activities at heights, swimming or participation in water activities or provide baby-sitting services while on Pain, Sleep and Anxiety Medications; until their outpatient Physician has advised to do so again. Also recommended to not to take more than prescribed Pain, Sleep and Anxiety Medications.  Consultants: Urology Procedures performed: None Disposition: Home Diet recommendation:  Discharge Diet Orders (From admission, onward)     Start     Ordered   08/20/23 0000  Diet general        08/20/23 1054            DISCHARGE MEDICATION: Allergies as of 08/20/2023   No Known Allergies      Medication List     STOP taking these medications    ciprofloxacin 250 MG tablet Commonly known as: CIPRO  Physician Discharge Summary   Patient: Gina Harrell MRN: [id number] DOB: 10-21-1941  Admit date:     08/18/2023  Discharge date: 08/20/23  Discharge Physician: Jerome Viglione. MD   PCP: [name], MD   Recommendations at discharge:   Patient will follow-up with Dr. Doreatha Lew to schedule cystoscopy and further workup for chronic hydronephrosis Continue Keflex 500 mg twice daily for 7 more days Triamterene HCTZ discontinued (severe hypokalemia on admission)  Discharge Diagnoses:    Pyelonephritis    History of bladder cancer   Hydronephrosis of left kidney   HTN (hypertension)   Aortic stenosis   UTI (urinary tract infection)   Hypothyroidism   Sepsis (HCC), POA    Hospital Course:  Patient is a 83 year old female with history of bladder cancer, diagnosed 22 years ago, long-term remission after excision and chemotherapy, HTN, aortic stenosis, carotid stenosis presented to ED with hematuria with clots and left-sided flank pain.  In ED, patient was noted to be febrile 100.6 F, WBCs 15.8 K.  UA showed UTI. CT scan showed left-sided hydronephrosis, slightly progressed from 2 years ago. Patient was placed on IV antibiotics and admitted for further workup.   Assessment and Plan: Left-sided pyelonephritis, complicated UTI, left-sided hydronephrosis -Sepsis ruled out -In the setting of chronic left-sided hydronephrosis, progressed from 2 years ago, history of bladder CA -Follow blood cultures and sensitivities, urine culture and sensitivities (unfortunately were not collected prior to initiating antibiotics in ED) -Patient was placed on IV Rocephin -Urology consulted, appreciate recommendations, could be hemorrhagic cystitis from chronic cystitis or recurrent bladder CA. -She was cleared by urology for discharge. Dr Sande Brothers was notified in clinic will contact her to schedule cystoscopy and further workup. Continue Keflex 500 mg twice daily for 7 more days      History of bladder cancer, lateral wall of urinary bladder and bladder neck -Per patient, in remission after excision and chemotherapy, >20 years ago.  Used to follow Dr. Patsi Sears and now Dr. Liliane Shi.        HTN (hypertension) -Triamterene/HCTZ discontinued Placed on the losartan 25 mg daily     Aortic stenosis -Mild to moderate, no acute issues     Hypothyroidism -Continue Synthroid  Hyponatremia, hypokalemia -Patient presented with severe hypokalemia on admission, 2.7, sodium 127 -Improved, potassium 3.8 on discharge, sodium 136 -Triamterene, HCTZ discontinued    Estimated body mass index is 20.93 kg/m as calculated from the following:   Height as of this encounter: 4\' 11"  (1.499 m).   Weight as of this encounter: 47 kg.     Pain control - [employer] Controlled Substance Reporting System database was reviewed. and patient was instructed, not to drive, operate heavy machinery, perform activities at heights, swimming or participation in water activities or provide baby-sitting services while on Pain, Sleep and Anxiety Medications; until their outpatient Physician has advised to do so again. Also recommended to not to take more than prescribed Pain, Sleep and Anxiety Medications.  Consultants: Urology Procedures performed: None Disposition: Home Diet recommendation:  Discharge Diet Orders (From admission, onward)     Start     Ordered   08/20/23 0000  Diet general        08/20/23 1054            DISCHARGE MEDICATION: Allergies as of 08/20/2023   No Known Allergies      Medication List     STOP taking these medications    ciprofloxacin 250 MG tablet Commonly known as: CIPRO  3D EF:        57 %                          LV EDV:       64 ml                          LV ESV:       27 ml                          LV SV:        36 ml RIGHT VENTRICLE RV Basal diam:  2.40 cm RV S prime:     17.20 cm/s TAPSE (M-mode): 1.5 cm LEFT ATRIUM             Index        RIGHT ATRIUM          Index LA diam:        2.90 cm 2.07 cm/m   RA Area:     8.36 cm LA Vol (A2C):   29.5 ml 21.03 ml/m  RA Volume:   13.30 ml 9.48 ml/m LA Vol (A4C):   23.3 ml 16.61 ml/m LA Biplane Vol: 26.9 ml 19.18 ml/m  AORTIC VALVE AV Area (Vmax):    0.71 cm AV Area (Vmean):   0.71 cm AV Area (VTI):     0.73 cm AV Vmax:           286.00 cm/s AV Vmean:          206.000 cm/s AV VTI:            0.625 m AV Peak Grad:      32.7 mmHg AV Mean Grad:      19.0 mmHg LVOT Vmax:         89.60 cm/s LVOT Vmean:        64.500 cm/s LVOT VTI:          0.200 m LVOT/AV VTI ratio: 0.32 AI PHT:            559 msec  AORTA Ao Root diam: 2.70 cm Ao Asc diam:  2.90 cm MITRAL VALVE MV Area (PHT):             SHUNTS MV Decel Time:             Systemic VTI:  0.20 m MV E velocity: 54.00 cm/s  Systemic Diam: 1.70 cm MV A velocity: 80.70 cm/s MV E/A ratio:  0.67 Weston Brass MD Electronically signed by Weston Brass MD Signature Date/Time: 08/18/2023/12:38:59 PM    Final    VAS US CAROTID  Result Date: 08/02/2023 Carotid Arterial Duplex Study Patient Name:  Gina Harrell  Date of Exam:   07/26/2023 Medical Rec #: [id number]          Accession #:    [id number] Date of Birth: 25-Mar-1941         Patient Gender: F Patient Age:   84 years Exam Location:  [facility] Procedure:      VAS US CAROTID Referring Phys: [facility] CROIT[facility]U --------------------------------------------------------------------------------  Indications:       Carotid artery disease and patient denies any cerebrovascular                     symptoms. Risk Factors:      Hypertension, hyperlipidemia, current smoker. Comparison Study:  07/2022, a carotid  3D EF:        57 %                          LV EDV:       64 ml                          LV ESV:       27 ml                          LV SV:        36 ml RIGHT VENTRICLE RV Basal diam:  2.40 cm RV S prime:     17.20 cm/s TAPSE (M-mode): 1.5 cm LEFT ATRIUM             Index        RIGHT ATRIUM          Index LA diam:        2.90 cm 2.07 cm/m   RA Area:     8.36 cm LA Vol (A2C):   29.5 ml 21.03 ml/m  RA Volume:   13.30 ml 9.48 ml/m LA Vol (A4C):   23.3 ml 16.61 ml/m LA Biplane Vol: 26.9 ml 19.18 ml/m  AORTIC VALVE AV Area (Vmax):    0.71 cm AV Area (Vmean):   0.71 cm AV Area (VTI):     0.73 cm AV Vmax:           286.00 cm/s AV Vmean:          206.000 cm/s AV VTI:            0.625 m AV Peak Grad:      32.7 mmHg AV Mean Grad:      19.0 mmHg LVOT Vmax:         89.60 cm/s LVOT Vmean:        64.500 cm/s LVOT VTI:          0.200 m LVOT/AV VTI ratio: 0.32 AI PHT:            559 msec  AORTA Ao Root diam: 2.70 cm Ao Asc diam:  2.90 cm MITRAL VALVE MV Area (PHT):             SHUNTS MV Decel Time:             Systemic VTI:  0.20 m MV E velocity: 54.00 cm/s  Systemic Diam: 1.70 cm MV A velocity: 80.70 cm/s MV E/A ratio:  0.67 Weston Brass MD Electronically signed by Weston Brass MD Signature Date/Time: 08/18/2023/12:38:59 PM    Final    VAS US CAROTID  Result Date: 08/02/2023 Carotid Arterial Duplex Study Patient Name:  Gina Harrell  Date of Exam:   07/26/2023 Medical Rec #: [id number]          Accession #:    [id number] Date of Birth: 25-Mar-1941         Patient Gender: F Patient Age:   84 years Exam Location:  [facility] Procedure:      VAS US CAROTID Referring Phys: [facility] --------------------------------------------------------------------------------  Indications:       Carotid artery disease and patient denies any cerebrovascular                     symptoms. Risk Factors:      Hypertension, hyperlipidemia, current smoker. Comparison Study:  07/2022, a carotid

## 2023-08-20 NOTE — Progress Notes (Signed)
   08/20/23 1142  TOC Brief Assessment  Insurance and Status Reviewed  Patient has primary care physician Yes  Home environment has been reviewed home alone  Prior level of function: independent  Prior/Current Home Services No current home services  Social Determinants of Health Reivew SDOH reviewed no interventions necessary  Readmission risk has been reviewed Yes  Transition of care needs no transition of care needs at this time

## 2023-08-20 NOTE — Evaluation (Signed)
Physical Therapy Brief Evaluation and Discharge Note Patient Details Name: Gina Harrell MRN: [id number] DOB: 21-Jul-1941 Today's Date: 08/20/2023   History of Present Illness  82 year old female with history of bladder cancer, diagnosed 22 years ago, long-term remission after excision and chemotherapy, HTN, aortic stenosis, carotid stenosis presented to ED with hematuria with clots and left-sided flank pain and admitted for Left-sided pyelonephritis, complicated UTI, left-sided hydronephrosis  Clinical Impression  Patient evaluated by Physical Therapy with no further acute PT needs identified. All education has been completed and the patient has no further questions.  Pt feels better and able to ambulate in hallway and perform stairs without any issues.  Pt eager to d/c home today. No further follow-up Physical Therapy or equipment needs. PT is signing off. Thank you for this referral.        PT Assessment Patient does not need any further PT services  Assistance Needed at Discharge  None    Equipment Recommendations None recommended by PT  Recommendations for Other Services       Precautions/Restrictions Precautions Precautions: None        Mobility  Bed Mobility   Supine/Sidelying to sit: Independent      Transfers Overall transfer level: Independent                      Ambulation/Gait Ambulation/Gait assistance: Independent Gait Distance (Feet): 500 Feet Assistive device: None Gait Pattern/deviations: WFL(Within Functional Limits) Gait Speed: Pace WFL (fast pace) General Gait Details: steady, no LOB  Home Activity Instructions    Stairs Stairs: Yes Stairs assistance: Supervision Stair Management: Alternating pattern, Forwards, One rail Right Number of Stairs: 10 General stair comments: ambulates with one rail ascending and 2 rails descending but reports that is her normal - cautious of falls, reports no issues with ambulation or stairs  today  Modified Rankin (Stroke Patients Only)        Balance Overall balance assessment: No apparent balance deficits (not formally assessed)                        Pertinent Vitals/Pain   Pain Assessment Pain Assessment: No/denies pain     Home Living Family/patient expects to be discharged to:: Private residence Living Arrangements: Alone       Home Equipment: None        Prior Function Level of Independence: Independent      UE/LE Assessment   UE ROM/Strength/Tone/Coordination: WFL    LE ROM/Strength/Tone/Coordination: [facility]      Communication   Communication Communication: No apparent difficulties     Cognition Overall Cognitive Status: Appears within functional limits for tasks assessed/performed       General Comments      Exercises     Assessment/Plan    PT Problem List         PT Visit Diagnosis Difficulty in walking, not elsewhere classified (R26.2)    No Skilled PT Patient is independent with all acitivity/mobility;Patient at baseline level of functioning   Co-evaluation                AMPAC 6 Clicks Help needed turning from your back to your side while in a flat bed without using bedrails?: None Help needed moving from lying on your back to sitting on the side of a flat bed without using bedrails?: None Help needed moving to and from a bed to a chair (including a wheelchair)?: None Help needed standing up from  a chair using your arms (e.g., wheelchair or bedside chair)?: None Help needed to walk in hospital room?: None Help needed climbing 3-5 steps with a railing? : None 6 Click Score: 24      End of Session   Activity Tolerance: Patient tolerated treatment well Patient left: in bed;with call bell/phone within reach Nurse Communication: Mobility status PT Visit Diagnosis: Difficulty in walking, not elsewhere classified (R26.2)     Time: 4696-2952 PT Time Calculation (min) (ACUTE ONLY): 8 min  Charges:    PT Evaluation $PT Eval Low Complexity: 1 Low     Kati PT, DPT Physical Therapist Acute Rehabilitation Services Office: [phone]   Janan Halter [place]  08/20/2023, 3:36 PM

## 2023-08-20 NOTE — Progress Notes (Signed)
Subjective: [name].  String of bottles collected overnight with probably 10 samples in the bathroom this morning.  None of which showed any signs of blood or blood clots.  Reviewed case with her and her son.  All questions were answered to their satisfaction.  Objective: Vital signs in last 24 hours: Temp:  [98.2 F (36.8 C)-99.8 F (37.7 C)] 98.6 F (37 C) (10/11 0542) Pulse Rate:  [57-69] 63 (10/11 0542) Resp:  [16-17] 17 (10/11 0542) BP: (101-154)/(61-70) 154/62 (10/11 0542) SpO2:  [95 %-98 %] 95 % (10/11 0542)  Assessment/Plan: #hematuria- resolved No bleeding in any samples collected overnight  # Hydronephrosis Possible obstructive uropathy from recurrent bladder cancer versus reflux.  Gina Harrell has been made aware and our clinic will contact her to schedule cystoscopy and further workup. Urology will sign off at this time.  Please call with questions  Intake/Output from previous day: 10/10 0701 - 10/11 0700 In: 660 [P.O.:660] Out: 400 [Urine:400]  Intake/Output this shift: No intake/output data recorded.  Physical Exam:  General: Alert and oriented CV: No cyanosis Lungs: equal chest rise Gu: Clear yellow urine  Lab Results: Recent Labs    08/18/23 1842 08/19/23 0401 08/20/23 0320  HGB 15.0 13.8 13.3  HCT 45.1 42.1 42.9   BMET Recent Labs    08/19/23 0401 08/20/23 0320  NA 135 136  K 3.1* 3.8  CL 103 105  CO2 20* 22  GLUCOSE 142* 112*  BUN 17 20  CREATININE 0.95 0.90  CALCIUM 9.1 8.5*     Studies/Results: CT Renal Stone Study  Result Date: 08/18/2023 CLINICAL DATA:  Abdominal/flank pain, stone suspected left flank, hematuria. Technologist notes state: Patient states she was referred from urgent care where she had a CT scan. EXAM: CT ABDOMEN AND PELVIS WITHOUT CONTRAST TECHNIQUE: Multidetector CT imaging of the abdomen and pelvis was performed following the standard protocol without IV contrast. RADIATION DOSE REDUCTION: This exam was  performed according to the departmental dose-optimization program which includes automated exposure control, adjustment of the mA and/or kV according to patient size and/or use of iterative reconstruction technique. COMPARISON:  CT 09/15/2021 FINDINGS: Lower chest: Clear lung bases.  Coronary artery calcifications Hepatobiliary: Chronic calcified granuloma anterior to the left hepatic lobe. Unenhanced liver otherwise unremarkable. Gallbladder physiologically distended, no calcified stone. No biliary dilatation. Pancreas: No ductal dilatation or inflammation. Spleen: Normal in size without focal abnormality. Adrenals/Urinary Tract: No adrenal nodule. Left hydroureteronephrosis is in part chronic, but progressed from prior exam. Left ureteral dilatation is also chronic, but mildly progressed. Low insertion of the ureter in the bladder in the low pelvis. No renal ureteral calculi. There may be some soft tissue thickening in the region of the ureteral insertion of the bladder, although not well assessed on this unenhanced exam. Insert prominence of the right renal collecting system without hydronephrosis. No right hydroureter. The small hypodense lesions in the right kidney on prior grossly stable, but not as well delineated on the current exam in the absence of contrast. No further follow-up imaging is recommended. Bladder slightly deviated into the right pelvis, low lying. No bladder stone or bladder inflammation. Stomach/Bowel: The stomach is nondistended. Small posterior gastric diverticulum without inflammation. 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 Small bowel is otherwise unremarkable without obstruction or inflammation. The appendix is not well-defined on the current exam. No evidence of appendicitis. Small to moderate volume of colonic stool. No colonic inflammation. Vascular/Lymphatic: Aortic and branch atherosclerosis. No aneurysm. No bulky abdominopelvic adenopathy. Reproductive:  Status post  hysterectomy. No adnexal masses. Other: No ascites.  Small fat containing bilateral inguinal hernias. Musculoskeletal: Scoliosis and degenerative change in the spine. There are no acute or suspicious osseous abnormalities. IMPRESSION: 1. Left hydroureteronephrosis is in part chronic, but progressed from November 2023 exam. Left ureteral dilatation is also chronic, but mildly progressed. The left ureteral insertion is low within the bladder. There may be some soft tissue thickening in the region of the ureteral insertion, not well assessed on this unenhanced exam. Recommend urologic follow-up. 2. No urolithiasis. Aortic Atherosclerosis (ICD10-I70.0). Electronically Signed   By: Narda Rutherford M.D.   On: 08/18/2023 21:35   ECHOCARDIOGRAM COMPLETE  Result Date: 08/18/2023    ECHOCARDIOGRAM REPORT   Patient Name:   Gina Harrell Date of Exam: 08/18/2023 Medical Rec #:  [id number]         Height:       59.0 in Accession #:    [id number]        Weight:       105.0 lb Date of Birth:  1941/09/22        BSA:          1.403 m Patient Age:    81 years          BP:           159/78 mmHg Patient Gender: F                 HR:           71 bpm. 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 Risk Factors:Hypertension and HLD.  Sonographer:    Clearence Ped RCS Referring Phys: [phone] MIHAI CROITORU IMPRESSIONS  1. Left ventricular ejection fraction, by estimation, is 65 to 70%. The left ventricle has normal function. The left ventricle has no regional wall motion abnormalities. There is moderate asymmetric left ventricular hypertrophy of the septal segment. Left ventricular diastolic parameters are consistent with Grade I diastolic dysfunction (impaired relaxation). The average left ventricular  global longitudinal strain is -21.9 %. The global longitudinal strain is normal.  2. Right ventricular systolic function is normal. The right ventricular size is normal. Tricuspid regurgitation signal is inadequate for assessing PA pressure.  3. The mitral valve is grossly normal. Trivial mitral valve regurgitation. No evidence of mitral stenosis.  4. The aortic valve is abnormal. There is moderate calcification of the aortic valve. Aortic valve regurgitation is mild. Mild to moderate aortic valve stenosis. Aortic valve area, by VTI measures 0.73 cm. Aortic valve mean gradient measures 19.0 mmHg.  Aortic valve Vmax measures 2.86 m/s.  5. The inferior vena cava is normal in size with greater than 50% respiratory variability, suggesting right atrial pressure of 3 mmHg.  6. Cannot exclude a small PFO. FINDINGS  Left Ventricle: Left ventricular ejection fraction, by estimation, is 65 to 70%. The left ventricle has normal function. The left ventricle has no regional wall motion abnormalities. The average left ventricular global longitudinal strain is -21.9 %. The global longitudinal strain is normal. 3D ejection fraction reviewed and evaluated as part of the interpretation. Alternate measurement of EF is felt to be most reflective of LV function. The left  ventricular internal cavity size was normal in size. There is moderate asymmetric left ventricular hypertrophy of the septal segment. Left ventricular diastolic parameters are consistent with Grade I diastolic dysfunction (impaired relaxation). Right Ventricle: The right ventricular size is normal. No increase in right ventricular wall thickness. Right ventricular systolic function is normal. Tricuspid regurgitation signal is inadequate for assessing PA pressure. Left Atrium: Left atrial size was normal in size. Right Atrium: Right atrial size was normal in size. Pericardium: Trivial pericardial effusion is present. Mitral Valve: The mitral valve is grossly normal.  Trivial mitral valve regurgitation. No evidence of mitral valve stenosis. Tricuspid Valve: The tricuspid valve is normal in structure. Tricuspid valve regurgitation is trivial. No evidence of tricuspid stenosis. Aortic Valve: The aortic valve is abnormal. There is moderate calcification of the aortic valve. Aortic valve regurgitation is mild. Aortic regurgitation PHT measures 559 msec. Mild to moderate aortic stenosis is present. Aortic valve mean gradient measures 19.0 mmHg. Aortic valve peak gradient measures 32.7 mmHg. Aortic valve area, by VTI measures 0.73 cm. Pulmonic Valve: The pulmonic valve was normal in structure. Pulmonic valve regurgitation is trivial. No evidence of pulmonic stenosis. Aorta: The aortic root is normal in size and structure. Venous: The inferior vena cava is normal in size with greater than 50% respiratory variability, suggesting right atrial pressure of 3 mmHg. IAS/Shunts: Cannot exclude a small PFO.  LEFT VENTRICLE PLAX 2D LVIDd:         3.40 cm   Diastology LVIDs:         2.10 cm   LV e' medial:    6.31 cm/s LV PW:         1.00 cm   LV E/e' medial:  8.6 LV IVS:        1.30 cm   LV e' lateral:   9.68 cm/s LVOT diam:     1.70 cm   LV E/e' lateral: 5.6 LV SV:         45 LV SV Index:   32        2D Longitudinal Strain LVOT Area:     2.27 cm  2D Strain GLS Avg:     -21.9 %                           3D Volume EF:                          3D EF:        57 %                          LV EDV:       64 ml                          LV ESV:       27 ml                          LV SV:        36 ml RIGHT VENTRICLE RV Basal diam:  2.40 cm RV S prime:     17.20 cm/s TAPSE (M-mode): 1.5 cm LEFT ATRIUM             Index        RIGHT ATRIUM  Index LA diam:        2.90 cm 2.07 cm/m   RA Area:     8.36 cm LA Vol (A2C):   29.5 ml 21.03 ml/m  RA Volume:   13.30 ml 9.48 ml/m LA Vol (A4C):   23.3 ml 16.61 ml/m LA Biplane Vol: 26.9 ml 19.18 ml/m  AORTIC VALVE AV Area (Vmax):    0.71 cm AV Area  (Vmean):   0.71 cm AV Area (VTI):     0.73 cm AV Vmax:           286.00 cm/s AV Vmean:          206.000 cm/s AV VTI:            0.625 m AV Peak Grad:      32.7 mmHg AV Mean Grad:      19.0 mmHg LVOT Vmax:         89.60 cm/s LVOT Vmean:        64.500 cm/s LVOT VTI:          0.200 m LVOT/AV VTI ratio: 0.32 AI PHT:            559 msec  AORTA Ao Root diam: 2.70 cm Ao Asc diam:  2.90 cm MITRAL VALVE MV Area (PHT):             SHUNTS MV Decel Time:             Systemic VTI:  0.20 m MV E velocity: 54.00 cm/s  Systemic Diam: 1.70 cm MV A velocity: 80.70 cm/s MV E/A ratio:  0.67 Weston Brass MD Electronically signed by Weston Brass MD Signature Date/Time: 08/18/2023/12:38:59 PM    Final       LOS: 1 day   Elmon Kirschner, NP [facility] Pager: [phone]  08/20/2023, 10:10 AM

## 2023-08-20 NOTE — Progress Notes (Signed)
Reviewed written D/C summary with pt and all questions answered. Pt verbalized understanding. Pt left in stable condition with all personal belongings.

## 2023-08-20 NOTE — Plan of Care (Signed)
  Problem: Education: Goal: Knowledge of General Education information will improve Description Including pain rating scale, medication(s)/side effects and non-pharmacologic comfort measures Outcome: Progressing   Problem: Health Behavior/Discharge Planning: Goal: Ability to manage health-related needs will improve Outcome: Progressing   

## 2023-08-24 LAB — CULTURE, BLOOD (ROUTINE X 2)
Culture: NO GROWTH
Culture: NO GROWTH
Special Requests: ADEQUATE
Special Requests: ADEQUATE

## 2023-09-16 ENCOUNTER — Other Ambulatory Visit (HOSPITAL_COMMUNITY): Payer: Self-pay

## 2023-09-20 ENCOUNTER — Ambulatory Visit: Payer: Medicare Other | Attending: Physician Assistant | Admitting: Physician Assistant

## 2023-09-20 ENCOUNTER — Encounter: Payer: Self-pay | Admitting: Physician Assistant

## 2023-09-20 VITALS — BP 132/82 | HR 59 | Ht 59.0 in | Wt 106.4 lb

## 2023-09-20 DIAGNOSIS — N12 Tubulo-interstitial nephritis, not specified as acute or chronic: Secondary | ICD-10-CM

## 2023-09-20 DIAGNOSIS — E785 Hyperlipidemia, unspecified: Secondary | ICD-10-CM | POA: Diagnosis not present

## 2023-09-20 DIAGNOSIS — I6523 Occlusion and stenosis of bilateral carotid arteries: Secondary | ICD-10-CM | POA: Diagnosis not present

## 2023-09-20 DIAGNOSIS — I700 Atherosclerosis of aorta: Secondary | ICD-10-CM

## 2023-09-20 DIAGNOSIS — I10 Essential (primary) hypertension: Secondary | ICD-10-CM

## 2023-09-20 NOTE — Patient Instructions (Addendum)
Medication Instructions:  NO CHANGES *If you need a refill on your cardiac medications before your next appointment, please call your pharmacy*   Lab Work: NO LABS If you have labs (blood work) drawn today and your tests are completely normal, you will receive your results only by: MyChart Message (if you have MyChart) OR A paper copy in the mail If you have any lab test that is abnormal or we need to change your treatment, we will call you to review the results.   Testing/Procedures: IN OCTOBER 2025 Your physician has requested that you have a carotid duplex. This test is an ultrasound of the carotid arteries in your neck. It looks at blood flow through these arteries that supply the brain with blood. Allow one hour for this exam. There are no restrictions or special instructions.    Follow-Up: At [facility], you and your health needs are our priority.  As part of our continuing mission to provide you with exceptional heart care, we have created designated Provider Care Teams.  These Care Teams include your primary Cardiologist (physician) and Advanced Practice Providers (APPs -  Physician Assistants and Nurse Practitioners) who all work together to provide you with the care you need, when you need it.  Your next appointment:   1 year(s)  Provider:   Thurmon Fair, MD

## 2023-09-20 NOTE — Progress Notes (Signed)
Cardiology Office Note:  .   Date:  09/20/2023  ID:  Gina Harrell, DOB 26-Feb-1941, MRN [id number] PCP: [name], MD  [facility] Providers Cardiologist:  Thurmon Fair, MD     History of Present Illness: .   Gina Harrell is a 82 y.o. female with past medical history of aortic stenosis, mild carotid artery disease, tobacco use, Raynaud's syndrome, hypertension and hyperlipidemia.  Carotid Doppler obtained on 07/22/2022 showed 40 to 59% stenosis in the left internal carotid artery, minimal plaque on the right side.  She was last seen by Dr. Royann Shivers in November 2023 at which time she was doing well.  Repeat echocardiogram obtained on 08/18/2023 showed EF 65 to 70%, grade 1 diastolic dysfunction, moderate asymmetric LVH of the septal segment, no regional wall motion abnormality, trivial MR, mild to moderate aortic stenosis, mild AI, cannot exclude a small PFO.  Patient was recently admitted in October 2024 due to left-sided pyelonephritis complicated UTI.  Sepsis was ruled out.  Patient was treated with IV Rocephin and oral Keflex.  Patient presents today for follow-up.  She is recovering well since the recent hospitalization.  She denies any chest pain or shortness of breath.  She has no lower extremity edema, orthopnea or PND.  She is due for carotid Doppler in 1 year.  ROS:   She denies chest pain, palpitations, dyspnea, pnd, orthopnea, n, v, dizziness, syncope, edema, weight gain, or early satiety. All other systems reviewed and are otherwise negative except as noted above.    Studies Reviewed: .        Cardiac Studies & Procedures       ECHOCARDIOGRAM  ECHOCARDIOGRAM COMPLETE 08/18/2023  Narrative ECHOCARDIOGRAM REPORT    Patient Name:   Gina Harrell Date of Exam: 08/18/2023 Medical Rec #:  [id number]         Height:       59.0 in Accession #:    [id number]        Weight:       105.0 lb Date of Birth:  Jun 26, 1941        BSA:          1.403  m Patient Age:    81 years          BP:           159/78 mmHg Patient Gender: F                 HR:           71 bpm. Exam Location:  [address]  Procedure: 2D Echo, 3D Echo, Cardiac Doppler, Color Doppler and Strain Analysis  Indications:    I35.0 Nonrheumatic aortic (valve) stenosis  History:        Patient has prior history of Echocardiogram examinations, most recent 07/11/2020. Risk Factors:Hypertension and HLD.  Sonographer:    Clearence Ped RCS Referring Phys: [phone] MIHAI CROITORU  IMPRESSIONS   1. Left ventricular ejection fraction, by estimation, is 65 to 70%. The left ventricle has normal function. The left ventricle has no regional wall motion abnormalities. There is moderate asymmetric left ventricular hypertrophy of the septal segment. Left ventricular diastolic parameters are consistent with Grade I diastolic dysfunction (impaired relaxation). The average left ventricular global longitudinal strain is -21.9 %. The global longitudinal strain is normal. 2. Right ventricular systolic function is normal. The right ventricular size is normal. Tricuspid regurgitation signal is inadequate for assessing PA pressure. 3. The mitral valve is grossly normal.  Trivial mitral valve regurgitation. No evidence of mitral stenosis. 4. The aortic valve is abnormal. There is moderate calcification of the aortic valve. Aortic valve regurgitation is mild. Mild to moderate aortic valve stenosis. Aortic valve area, by VTI measures 0.73 cm. Aortic valve mean gradient measures 19.0 mmHg. Aortic valve Vmax measures 2.86 m/s. 5. The inferior vena cava is normal in size with greater than 50% respiratory variability, suggesting right atrial pressure of 3 mmHg. 6. Cannot exclude a small PFO.  FINDINGS Left Ventricle: Left ventricular ejection fraction, by estimation, is 65 to 70%. The left ventricle has normal function. The left ventricle has no regional wall motion abnormalities. The average left  ventricular global longitudinal strain is -21.9 %. The global longitudinal strain is normal. 3D ejection fraction reviewed and evaluated as part of the interpretation. Alternate measurement of EF is felt to be most reflective of LV function. The left ventricular internal cavity size was normal in size. There is moderate asymmetric left ventricular hypertrophy of the septal segment. Left ventricular diastolic parameters are consistent with Grade I diastolic dysfunction (impaired relaxation).  Right Ventricle: The right ventricular size is normal. No increase in right ventricular wall thickness. Right ventricular systolic function is normal. Tricuspid regurgitation signal is inadequate for assessing PA pressure.  Left Atrium: Left atrial size was normal in size.  Right Atrium: Right atrial size was normal in size.  Pericardium: Trivial pericardial effusion is present.  Mitral Valve: The mitral valve is grossly normal. Trivial mitral valve regurgitation. No evidence of mitral valve stenosis.  Tricuspid Valve: The tricuspid valve is normal in structure. Tricuspid valve regurgitation is trivial. No evidence of tricuspid stenosis.  Aortic Valve: The aortic valve is abnormal. There is moderate calcification of the aortic valve. Aortic valve regurgitation is mild. Aortic regurgitation PHT measures 559 msec. Mild to moderate aortic stenosis is present. Aortic valve mean gradient measures 19.0 mmHg. Aortic valve peak gradient measures 32.7 mmHg. Aortic valve area, by VTI measures 0.73 cm.  Pulmonic Valve: The pulmonic valve was normal in structure. Pulmonic valve regurgitation is trivial. No evidence of pulmonic stenosis.  Aorta: The aortic root is normal in size and structure.  Venous: The inferior vena cava is normal in size with greater than 50% respiratory variability, suggesting right atrial pressure of 3 mmHg.  IAS/Shunts: Cannot exclude a small PFO.   LEFT VENTRICLE PLAX 2D LVIDd:          3.40 cm   Diastology LVIDs:         2.10 cm   LV e' medial:    6.31 cm/s LV PW:         1.00 cm   LV E/e' medial:  8.6 LV IVS:        1.30 cm   LV e' lateral:   9.68 cm/s LVOT diam:     1.70 cm   LV E/e' lateral: 5.6 LV SV:         45 LV SV Index:   32        2D Longitudinal Strain LVOT Area:     2.27 cm  2D Strain GLS Avg:     -21.9 %  3D Volume EF: 3D EF:        57 % LV EDV:       64 ml LV ESV:       27 ml LV SV:        36 ml  RIGHT VENTRICLE RV Basal diam:  2.40 cm RV S prime:  17.20 cm/s TAPSE (M-mode): 1.5 cm  LEFT ATRIUM             Index        RIGHT ATRIUM          Index LA diam:        2.90 cm 2.07 cm/m   RA Area:     8.36 cm LA Vol (A2C):   29.5 ml 21.03 ml/m  RA Volume:   13.30 ml 9.48 ml/m LA Vol (A4C):   23.3 ml 16.61 ml/m LA Biplane Vol: 26.9 ml 19.18 ml/m AORTIC VALVE AV Area (Vmax):    0.71 cm AV Area (Vmean):   0.71 cm AV Area (VTI):     0.73 cm AV Vmax:           286.00 cm/s AV Vmean:          206.000 cm/s AV VTI:            0.625 m AV Peak Grad:      32.7 mmHg AV Mean Grad:      19.0 mmHg LVOT Vmax:         89.60 cm/s LVOT Vmean:        64.500 cm/s LVOT VTI:          0.200 m LVOT/AV VTI ratio: 0.32 AI PHT:            559 msec  AORTA Ao Root diam: 2.70 cm Ao Asc diam:  2.90 cm  MITRAL VALVE MV Area (PHT):             SHUNTS MV Decel Time:             Systemic VTI:  0.20 m MV E velocity: 54.00 cm/s  Systemic Diam: 1.70 cm MV A velocity: 80.70 cm/s MV E/A ratio:  0.67  Weston Brass MD Electronically signed by Weston Brass MD Signature Date/Time: 08/18/2023/12:38:59 PM    Final             Risk Assessment/Calculations:             Physical Exam:   VS:  BP 132/82 (BP Location: Left Arm, Patient Position: Sitting, Cuff Size: Normal)   Pulse (!) 59   Ht 4\' 11"  (1.499 m)   Wt 106 lb 6.4 oz (48.3 kg)   SpO2 96%   BMI 21.49 kg/m    Wt Readings from Last 3 Encounters:  09/20/23 106 lb 6.4 oz (48.3 kg)  08/18/23 103  lb 9.9 oz (47 kg)  06/12/23 105 lb (47.6 kg)    GEN: Well nourished, well developed in no acute distress NECK: No JVD; No carotid bruits CARDIAC: RRR, no murmurs, rubs, gallops RESPIRATORY:  Clear to auscultation without rales, wheezing or rhonchi  ABDOMEN: Soft, non-tender, non-distended EXTREMITIES:  No edema; No deformity   ASSESSMENT AND PLAN: .    Aortic Stenosis Mild to moderate aortic stenosis with no symptoms of heart failure or chest pain. No intervention required at this time. -Continue current management and monitor for any changes in symptoms.  Carotid Artery Disease Stable carotid artery disease with 40-59% stenosis in the left internal carotid artery. Bruit heard on examination likely due to aortic stenosis. -Repeat carotid ultrasound in October 2025.  Hypertension and Hyperlipidemia Well controlled on Losartan and Rosuvastatin. -Continue current medications.  Recent Hospitalization for Pyelonephritis Recovered from recent hospitalization for pyelonephritis and urinary tract infection. Treated with IV Rocephin and oral Keflex. -No further action required at this time.  Dispo: Follow-up in 1 year  Signed, Azalee Course, [place]

## 2023-10-18 ENCOUNTER — Other Ambulatory Visit (HOSPITAL_COMMUNITY): Payer: Self-pay

## 2023-11-17 ENCOUNTER — Other Ambulatory Visit (HOSPITAL_COMMUNITY): Payer: Self-pay

## 2023-12-01 ENCOUNTER — Other Ambulatory Visit (HOSPITAL_BASED_OUTPATIENT_CLINIC_OR_DEPARTMENT_OTHER): Payer: Self-pay

## 2023-12-09 ENCOUNTER — Other Ambulatory Visit (HOSPITAL_COMMUNITY): Payer: Self-pay

## 2023-12-09 ENCOUNTER — Other Ambulatory Visit: Payer: Self-pay

## 2023-12-22 ENCOUNTER — Telehealth: Payer: Self-pay | Admitting: *Deleted

## 2023-12-22 NOTE — Telephone Encounter (Signed)
Pt returned call, f/u scheduled 02/11/24 with Dr Royann Shivers

## 2023-12-22 NOTE — Telephone Encounter (Signed)
-----   Message from [facility] sent at 12/21/2023  6:09 PM EST ----- Regarding: Appt  Can I please see her in clinic in March or April at the latest?

## 2023-12-22 NOTE — Telephone Encounter (Signed)
Left message for pt to call to make a follow up appointment.

## 2024-01-03 ENCOUNTER — Encounter: Payer: Self-pay | Admitting: Cardiovascular Disease

## 2024-01-03 ENCOUNTER — Ambulatory Visit: Payer: Medicare Other | Attending: Cardiovascular Disease | Admitting: Cardiovascular Disease

## 2024-01-03 VITALS — BP 140/76 | HR 58 | Ht 59.0 in | Wt 109.0 lb

## 2024-01-03 DIAGNOSIS — I700 Atherosclerosis of aorta: Secondary | ICD-10-CM

## 2024-01-03 DIAGNOSIS — I6522 Occlusion and stenosis of left carotid artery: Secondary | ICD-10-CM

## 2024-01-03 DIAGNOSIS — I10 Essential (primary) hypertension: Secondary | ICD-10-CM | POA: Diagnosis not present

## 2024-01-03 DIAGNOSIS — I350 Nonrheumatic aortic (valve) stenosis: Secondary | ICD-10-CM | POA: Diagnosis not present

## 2024-01-03 DIAGNOSIS — F17200 Nicotine dependence, unspecified, uncomplicated: Secondary | ICD-10-CM

## 2024-01-03 DIAGNOSIS — E7800 Pure hypercholesterolemia, unspecified: Secondary | ICD-10-CM

## 2024-01-03 NOTE — Patient Instructions (Addendum)
 Medication Instructions:  No changes *If you need a refill on your cardiac medications before your next appointment, please call your pharmacy*   Lab Work: BMET, CBC  If you have labs (blood work) drawn today and your tests are completely normal, you will receive your results only by: MyChart Message (if you have MyChart) OR A paper copy in the mail If you have any lab test that is abnormal or we need to change your treatment, we will call you to review the results.   Testing/Procedures:       Cardiac/Peripheral Catheterization   You are scheduled for a on January 31, 2024  , with Dr. Lynnette Caffey .  1. Please arrive at the [facility] (Main Entrance A) at [facility]: [address] [place], [place] [address] at 7:00 am (This time is 2 hour(s) before your procedure to ensure your preparation).   Free valet parking service is available. You will check in at ADMITTING. The support person will be asked to wait in the waiting room.  It is OK to have someone drop you off and come back when you are ready to be discharged.        Special note: Every effort is made to have your procedure done on time. Please understand that emergencies sometimes delay scheduled procedures.  2. Diet: Do not eat solid foods after midnight.  You may have clear liquids until 5 AM the day of the procedure.  3. Labs: You will need to have blood drawn on , at . You do not need to be fasting. 4. Medication instructions in preparation for your procedure:   Contrast Allergy: no     Do not take Losartan the morning procedure.     On the morning of your procedure, take  Aspirin 81 mg  ( ONLY That MORNING per Dr Royann Shivers) and any morning medicines NOT listed above.  You may use sips of water.  5. Plan to go home the same day, you will only stay overnight if medically necessary. 6. You MUST have a responsible adult to drive you home. 7. An adult MUST be with you the first 24 hours after you arrive  home. 8. Bring a current list of your medications, and the last time and date medication taken. 9. Bring ID and current insurance cards. 10.Please wear clothes that are easy to get on and off and wear slip-on shoes.  Thank you for allowing [place] to care for you!   -- [facility]    Follow-Up: At [facility], you and your health needs are our priority.  As part of our continuing mission to provide you with exceptional heart care, we have created designated Provider Care Teams.  These Care Teams include your primary Cardiologist (physician) and Advanced Practice Providers (APPs -  Physician Assistants and Nurse Practitioners) who all work together to provide you with the care you need, when you need it.  We recommend signing up for the patient portal called "MyChart".  Sign up information is provided on this After Visit Summary.  MyChart is used to connect with patients for Virtual Visits (Telemedicine).  Patients are able to view lab/test results, encounter notes, upcoming appointments, etc.  Non-urgent messages can be sent to your provider as well.   To learn more about what you can do with MyChart, go to [email or web address].    Your next appointment:   2 months   Provider:    Dr Royann Shivers

## 2024-01-03 NOTE — H&P (View-Only) (Signed)
 Patient ID: Gina Harrell, female   DOB: 09-29-1941, 83 y.o.   MRN: [id number]     Cardiology Office Note    Date:  01/03/2024   ID:  Gina Harrell, DOB 01-20-1941, MRN [id number]  PCP:  [name], MD  Cardiologist:   Thurmon Fair, MD   Chief Complaint  Patient presents with   Shortness of Breath     History of Present Illness:  Gina Harrell is a 83 y.o. female with moderate aortic stenosis, mild carotid atherosclerosis, hypertension and hyperlipidemia who returns with complaints of exertional dyspnea and exertional chest discomfort.  Her symptoms became noticeable just over the last month or so.  They were particularly prominent during the week leading up to Valentine's Day, which is very busy for her, working in the [employer].  She has to climb 16 steps to get to her apartment and she he gets very short of breath when she gets to the top.  The symptoms are worse and she gets some chest discomfort if she carries grocery bags to the top as well.  Instead of carrying all of them at the same time she is taking just 2 bags at a time so that she does not have to stop to catch her breath.  She denies dizziness with exertion and has not experienced syncope.  She denies palpitations.  She occasionally has a "ping" sharp sensation in her precordial area that is not exertional.  She does not have orthopnea or PND.  She is strongly considering cutting back on her 5-day work week.  She has cut back on smoking but still smokes a few cigarettes every day.  Her echocardiogram performed last October was interpreted as showing mild to moderate aortic stenosis with a mean gradient of 19 mmHg, dimensionless valve index of 0.32.  The calculated aortic valve area was only 0.7 cm, but Gina Harrell is quite petite with a BSA of only 1.4 m.  The indexed aortic valve area is 0.5 cm/m.  Her stroke-volume index is only 32 mL.  She has normal left ventricular ejection fraction 65-7%.  Findings  were therefore borderline for paradoxical low flow low gradient severe aortic stenosis.  She was hospitalized 08/18/2023 with pyelonephritis.  Her most recent carotid Doppler shows stable findings with 40-59% left carotid stenosis.   Past Medical History:  Diagnosis Date   Cancer [facility])    Bladder cancer   Elevated cholesterol    GERD (gastroesophageal reflux disease)    Heart murmur    Heart murmur    Hypertension    Hypothyroidism    Osteoporosis    Uterine prolapse     Past Surgical History:  Procedure Laterality Date   BLADDER TUMOR EXCISION     BREAST EXCISIONAL BIOPSY Right    CAROTID DOPPLER  2013   COLONOSCOPY WITH PROPOFOL N/A 02/17/2016   Procedure: COLONOSCOPY WITH PROPOFOL;  Surgeon: Charolett Bumpers, MD;  Location: [facility];  Service: Endoscopy;  Laterality: N/A;   DOPPLER ECHOCARDIOGRAPHY  2013   ESOPHAGOGASTRODUODENOSCOPY (EGD) WITH PROPOFOL N/A 02/17/2016   Procedure: ESOPHAGOGASTRODUODENOSCOPY (EGD) WITH PROPOFOL;  Surgeon: Charolett Bumpers, MD;  Location: [facility];  Service: Endoscopy;  Laterality: N/A;   VAGINAL HYSTERECTOMY      Current Medications: Outpatient Medications Prior to Visit  Medication Sig Dispense Refill   ALPRAZolam (XANAX) 0.5 MG tablet Take 0.5 mg by mouth at bedtime.      Cholecalciferol 50 MCG (2000 UT) CAPS Take 1 capsule by mouth daily  at 6 (six) AM.     CRANBERRY PO Take 2 capsules by mouth daily.     diphenhydramine-acetaminophen (TYLENOL PM) 25-500 MG TABS Take 2 tablets by mouth at bedtime. Reported on 12/26/2015     escitalopram (LEXAPRO) 10 MG tablet Take 10 mg by mouth daily.     levothyroxine (SYNTHROID, LEVOTHROID) 50 MCG tablet Take 50 mcg by mouth daily before breakfast. Reported on 12/26/2015     losartan (COZAAR) 25 MG tablet Take 1 tablet (25 mg total) by mouth daily. 30 tablet 3   rosuvastatin (CRESTOR) 20 MG tablet Take 20 mg by mouth daily.     albuterol (VENTOLIN HFA) 108 (90 Base) MCG/ACT inhaler Inhale 2  puffs into the lungs every 4 (four) hours as needed. (Patient not taking: Reported on 01/03/2024)     amoxicillin (AMOXIL) 500 MG capsule Take 500 mg by mouth as needed. Before dental visit (Patient not taking: Reported on 01/03/2024)     Cholecalciferol (VITAMIN D-3) 1000 UNITS CAPS Take by mouth daily. Reported on 12/26/2015 (Patient not taking: Reported on 01/03/2024)     meloxicam (MOBIC) 7.5 MG tablet Take 7.5 mg by mouth daily. (Patient not taking: Reported on 01/03/2024)     ondansetron (ZOFRAN) 4 MG tablet Take 1 tablet (4 mg total) by mouth every 6 (six) hours as needed for nausea or vomiting. 20 tablet 0   No facility-administered medications prior to visit.     Allergies:   Patient has no known allergies.   Social History   Socioeconomic History   Marital status: Divorced    Spouse name: Not on file   Number of children: Not on file   Years of education: Not on file   Highest education level: Not on file  Occupational History   Not on file  Tobacco Use   Smoking status: Every Day    Current packs/day: 0.50    Average packs/day: 0.5 packs/day for 50.0 years (25.0 ttl pk-yrs)    Types: Cigarettes   Smokeless tobacco: Never   Tobacco comments:    01/03/2024 Patient states one pack last about three days  Substance and Sexual Activity   Alcohol use: Yes    Alcohol/week: 7.0 standard drinks of alcohol    Types: 7 Standard drinks or equivalent per week   Drug use: No   Sexual activity: Never    Birth control/protection: Surgical  Other Topics Concern   Not on file  Social History Narrative   Not on file   Social Drivers of Health   Financial Resource Strain: Not on file  Food Insecurity: No Food Insecurity (08/19/2023)   Hunger Vital Sign    Worried About Running Out of Food in the Last Year: Never true    Ran Out of Food in the Last Year: Never true  Transportation Needs: No Transportation Needs (08/19/2023)   PRAPARE - Administrator, Civil Service  (Medical): No    Lack of Transportation (Non-Medical): No  Physical Activity: Not on file  Stress: Not on file  Social Connections: Not on file     Family History:  The patient's  family history includes Breast cancer in her maternal aunt; Cancer in her mother; Diabetes in her maternal grandmother; Heart disease in her father; Hypertension in her mother.   ROS:   Please see the history of present illness.    All other systems are reviewed and are negative.   PHYSICAL EXAM:   VS:  BP (!) 140/76  Pulse (!) 58   Ht 4\' 11"  (1.499 m)   Wt 109 lb (49.4 kg)   SpO2 93%   BMI 22.02 kg/m      General: Alert, oriented x3, no distress, she appears younger than stated age.  She is very petite and lean. Head: no evidence of trauma, PERRL, EOMI, no exophtalmos or lid lag, no myxedema, no xanthelasma; normal ears, nose and oropharynx Neck: normal jugular venous pulsations and no hepatojugular reflux; brisk carotid pulses without delay and unchanged bilateral carotid bruits Chest: clear to auscultation, no signs of consolidation by percussion or palpation, normal fremitus, symmetrical and full respiratory excursions Cardiovascular: normal position and quality of the apical impulse, regular rhythm, normal first and still has a distinct second heart sounds, 2-3/6 aortic ejection murmur is mid peaking and radiates towards both carotids no diastolic murmurs, rubs or gallops Abdomen: no tenderness or distention, no masses by palpation, no abnormal pulsatility or arterial bruits, normal bowel sounds, no hepatosplenomegaly Extremities: no clubbing, cyanosis or edema; 2+ radial, ulnar and brachial pulses bilaterally; 2+ right femoral, posterior tibial and dorsalis pedis pulses; 2+ left femoral, posterior tibial and dorsalis pedis pulses; no subclavian or femoral bruits Neurological: grossly nonfocal Psych: Normal mood and affect   Wt Readings from Last 3 Encounters:  01/03/24 109 lb (49.4 kg)  09/20/23  106 lb 6.4 oz (48.3 kg)  08/18/23 103 lb 9.9 oz (47 kg)    Studies/Labs Reviewed:   ECHO 08/18/2023:   1. Left ventricular ejection fraction, by estimation, is 65 to 70%. The  left ventricle has normal function. The left ventricle has no regional  wall motion abnormalities. There is moderate asymmetric left ventricular  hypertrophy of the septal segment.  Left ventricular diastolic parameters are consistent with Grade I  diastolic dysfunction (impaired relaxation). The average left ventricular  global longitudinal strain is -21.9 %. The global longitudinal strain is  normal.   2. Right ventricular systolic function is normal. The right ventricular  size is normal. Tricuspid regurgitation signal is inadequate for assessing  PA pressure.   3. The mitral valve is grossly normal. Trivial mitral valve  regurgitation. No evidence of mitral stenosis.   4. The aortic valve is abnormal. There is moderate calcification of the  aortic valve. Aortic valve regurgitation is mild. Mild to moderate aortic  valve stenosis. Aortic valve area, by VTI measures 0.73 cm. Aortic valve  mean gradient measures 19.0 mmHg.   Aortic valve Vmax measures 2.86 m/s.   5. The inferior vena cava is normal in size with greater than 50%  respiratory variability, suggesting right atrial pressure of 3 mmHg.   6. Cannot exclude a small PFO.    AV Area (VTI):     0.73 cm  AV Vmax:           286.00 cm/s  AV Peak Grad:      32.7 mmHg  AV Mean Grad:      19.0 mmHg  LVOT/AV VTI ratio: 0.32  AI PHT:            559 msec     Carotid Doppler 07/26/2023: Right Carotid: Velocities in the right ICA are consistent with a 1-39%  stenosis.   Left Carotid: Velocities in the left ICA are consistent with a 40-59%  stenosis. Non-hemodynamically significant plaque <50% noted in the  CCA. LICA  stenosis based on peak systolic velocities.   Vertebrals:  Bilateral vertebral arteries demonstrate antegrade flow.  Subclavians:  Normal flow hemodynamics were seen  in bilateral subclavian  arteries.    EKG: Personally reviewed the tracing from 09/20/2023 which shows sinus rhythm and questionable left atrial abnormality, otherwise normal tracing  EKG Interpretation Date/Time:    Ventricular Rate:    PR Interval:    QRS Duration:    QT Interval:    QTC Calculation:   R Axis:      Text Interpretation:            Latest Ref Rng & Units 08/20/2023    3:20 AM 08/19/2023    4:01 AM 08/18/2023    6:42 PM  BMP  Glucose 70 - 99 mg/dL 130  865  784   BUN 8 - 23 mg/dL 20  17  18    Creatinine 0.44 - 1.00 mg/dL 6.96  2.95  2.84   Sodium 135 - 145 mmol/L 136  135  131   Potassium 3.5 - 5.1 mmol/L 3.8  3.1  3.5   Chloride 98 - 111 mmol/L 105  103  97   CO2 22 - 32 mmol/L 22  20  22    Calcium 8.9 - 10.3 mg/dL 8.5  9.1  9.2     CBC:    Component Value Date/Time   WBC 6.3 08/20/2023 0320   HGB 13.3 08/20/2023 0320   HCT 42.9 08/20/2023 0320   PLT 119 (L) 08/20/2023 0320   MCV 96.4 08/20/2023 0320   NEUTROABS 13.3 (H) 08/18/2023 1842   LYMPHSABS 1.1 08/18/2023 1842   MONOABS 1.3 (H) 08/18/2023 1842   EOSABS 0.0 08/18/2023 1842   BASOSABS 0.1 08/18/2023 1842    Lipid Panel  No results found for: "CHOL", "TRIG", "HDL", "CHOLHDL", "VLDL", "LDLCALC", "LDLDIRECT", "LABVLDL"   Recent Labs: February 28, 2020  creatinine 1.2,  hemoglobin 15.3 normal liver function tests cholesterol 148, triglycerides 56, LDL 78, HDL 59.  03/18/2022 Cholesterol 113, HDL 44, LDL 49, triglycerides 101 Hemoglobin 13.4, creatinine 1.2, potassium 3.8, ALT 22, TSH 1.29  04/01/2023 Cholesterol 159, HDL 63, LDL 76, triglycerides 111  ASSESSMENT:    1. Nonrheumatic aortic valve stenosis   2. Carotid stenosis, asymptomatic, left   3. Essential hypertension   4. Hypercholesteremia   5. Aortic atherosclerosis (HCC)   6. Smoking        PLAN:  In order of problems listed above:  AS: She seems to be becoming symptomatic.  None  of her symptoms suggest an unstable coronary syndrome.  She has exertional dyspnea, climbing stairs to her apartment and occasionally also seems to have exertional angina.  Her echocardiogram in October was interpreted as showing mild-moderate aortic stenosis based on the gradients, but in fact stroke-volume index 32 , dimensionless valve index 0.32  and calculated aortic valve area 0.7 suggest paradoxical low-flow low gradient moderate-severe aortic stenosis.  Again reviewed the options of TAVR versus SAVR depending on whether or not she has coronary disease and/or aortic dilation and good peripheral access for stent valve.  Since she is symptomatic I think we should start her workup with a right and left heart catheterization to better characterize the aortic stenosis and to see whether her symptoms could be due to concomitant coronary artery disease.  Carotid stenosis: Carotid Doppler findings have been very stable over the last 4 years.  No neurological complaints. HTN: Blood pressure is a little high today but she was nervous.  Just a few days ago her blood pressure was 114/72. HLP: Lipid parameters are very close to target.  Continue statin. Aortic atherosclerosis: Imaging studies show  evidence of extensive atherosclerotic plaque in the aorta, iliac arteries and abdominal visceral arteries but she has had no symptoms related to this or any intermittent claudication.   Raynaud's syndrome: She has not had any symptoms of this in years. Smoking:  once again strongly recommend complete and permanent smoking cessation  Informed Consent   Shared Decision Making/Informed Consent The risks [stroke (1 in 1000), death (1 in 1000), kidney failure [usually temporary] (1 in 500), bleeding (1 in 200), allergic reaction [possibly serious] (1 in 200)], benefits (diagnostic support and management of coronary artery disease) and alternatives of a cardiac catheterization were discussed in detail with Ms. Kuiken and she  is willing to proceed.       Medication Adjustments/Labs and Tests Ordered: Current medicines are reviewed at length with the patient today.  Concerns regarding medicines are outlined above.  Medication changes, Labs and Tests ordered today are listed in the Patient Instructions below. Patient Instructions  Medication Instructions:  No changes *If you need a refill on your cardiac medications before your next appointment, please call your pharmacy*   Lab Work: BMET, CBC  If you have labs (blood work) drawn today and your tests are completely normal, you will receive your results only by: MyChart Message (if you have MyChart) OR A paper copy in the mail If you have any lab test that is abnormal or we need to change your treatment, we will call you to review the results.   Testing/Procedures:       Cardiac/Peripheral Catheterization   You are scheduled for a on January 31, 2024  , with Dr. Lynnette Caffey .  1. Please arrive at the [facility] (Main Entrance A) at [facility]: [address] [place], [place] [address] at 7:00 am (This time is 2 hour(s) before your procedure to ensure your preparation).   Free valet parking service is available. You will check in at ADMITTING. The support person will be asked to wait in the waiting room.  It is OK to have someone drop you off and come back when you are ready to be discharged.        Special note: Every effort is made to have your procedure done on time. Please understand that emergencies sometimes delay scheduled procedures.  2. Diet: Do not eat solid foods after midnight.  You may have clear liquids until 5 AM the day of the procedure.  3. Labs: You will need to have blood drawn on , at . You do not need to be fasting. 4. Medication instructions in preparation for your procedure:   Contrast Allergy: no     Do not take Losartan the morning procedure.     On the morning of your procedure, take  Aspirin 81 mg  ( ONLY That  MORNING per Dr Royann Shivers) and any morning medicines NOT listed above.  You may use sips of water.  5. Plan to go home the same day, you will only stay overnight if medically necessary. 6. You MUST have a responsible adult to drive you home. 7. An adult MUST be with you the first 24 hours after you arrive home. 8. Bring a current list of your medications, and the last time and date medication taken. 9. Bring ID and current insurance cards. 10.Please wear clothes that are easy to get on and off and wear slip-on shoes.  Thank you for allowing [place] to care for you!   -- [facility]    Follow-Up:  At [facility], you and your health needs are our priority.  As part of our continuing mission to provide you with exceptional heart care, we have created designated Provider Care Teams.  These Care Teams include your primary Cardiologist (physician) and Advanced Practice Providers (APPs -  Physician Assistants and Nurse Practitioners) who all work together to provide you with the care you need, when you need it.  We recommend signing up for the patient portal called "MyChart".  Sign up information is provided on this After Visit Summary.  MyChart is used to connect with patients for Virtual Visits (Telemedicine).  Patients are able to view lab/test results, encounter notes, upcoming appointments, etc.  Non-urgent messages can be sent to your provider as well.   To learn more about what you can do with MyChart, go to [email or web address].    Your next appointment:   2 months   Provider:    Dr Royann Shivers   Signed, Thurmon Fair, MD  01/03/2024 11:44 AM    [facility] [address] [place], [place], [place]  [address] Phone: [phone]; Fax: [phone]

## 2024-01-03 NOTE — Progress Notes (Signed)
 Patient ID: Gina Harrell, female   DOB: 09-29-1941, 83 y.o.   MRN: [id number]     Cardiology Office Note    Date:  01/03/2024   ID:  Gina Harrell, DOB 01-20-1941, MRN [id number]  PCP:  [name], MD  Cardiologist:   Thurmon Fair, MD   Chief Complaint  Patient presents with   Shortness of Breath     History of Present Illness:  Gina Harrell is a 83 y.o. female with moderate aortic stenosis, mild carotid atherosclerosis, hypertension and hyperlipidemia who returns with complaints of exertional dyspnea and exertional chest discomfort.  Her symptoms became noticeable just over the last month or so.  They were particularly prominent during the week leading up to Valentine's Day, which is very busy for her, working in the [employer].  She has to climb 16 steps to get to her apartment and she he gets very short of breath when she gets to the top.  The symptoms are worse and she gets some chest discomfort if she carries grocery bags to the top as well.  Instead of carrying all of them at the same time she is taking just 2 bags at a time so that she does not have to stop to catch her breath.  She denies dizziness with exertion and has not experienced syncope.  She denies palpitations.  She occasionally has a "ping" sharp sensation in her precordial area that is not exertional.  She does not have orthopnea or PND.  She is strongly considering cutting back on her 5-day work week.  She has cut back on smoking but still smokes a few cigarettes every day.  Her echocardiogram performed last October was interpreted as showing mild to moderate aortic stenosis with a mean gradient of 19 mmHg, dimensionless valve index of 0.32.  The calculated aortic valve area was only 0.7 cm, but Gina Harrell is quite petite with a BSA of only 1.4 m.  The indexed aortic valve area is 0.5 cm/m.  Her stroke-volume index is only 32 mL.  She has normal left ventricular ejection fraction 65-7%.  Findings  were therefore borderline for paradoxical low flow low gradient severe aortic stenosis.  She was hospitalized 08/18/2023 with pyelonephritis.  Her most recent carotid Doppler shows stable findings with 40-59% left carotid stenosis.   Past Medical History:  Diagnosis Date   Cancer [facility])    Bladder cancer   Elevated cholesterol    GERD (gastroesophageal reflux disease)    Heart murmur    Heart murmur    Hypertension    Hypothyroidism    Osteoporosis    Uterine prolapse     Past Surgical History:  Procedure Laterality Date   BLADDER TUMOR EXCISION     BREAST EXCISIONAL BIOPSY Right    CAROTID DOPPLER  2013   COLONOSCOPY WITH PROPOFOL N/A 02/17/2016   Procedure: COLONOSCOPY WITH PROPOFOL;  Surgeon: Charolett Bumpers, MD;  Location: [facility];  Service: Endoscopy;  Laterality: N/A;   DOPPLER ECHOCARDIOGRAPHY  2013   ESOPHAGOGASTRODUODENOSCOPY (EGD) WITH PROPOFOL N/A 02/17/2016   Procedure: ESOPHAGOGASTRODUODENOSCOPY (EGD) WITH PROPOFOL;  Surgeon: Charolett Bumpers, MD;  Location: [facility];  Service: Endoscopy;  Laterality: N/A;   VAGINAL HYSTERECTOMY      Current Medications: Outpatient Medications Prior to Visit  Medication Sig Dispense Refill   ALPRAZolam (XANAX) 0.5 MG tablet Take 0.5 mg by mouth at bedtime.      Cholecalciferol 50 MCG (2000 UT) CAPS Take 1 capsule by mouth daily  at 6 (six) AM.     CRANBERRY PO Take 2 capsules by mouth daily.     diphenhydramine-acetaminophen (TYLENOL PM) 25-500 MG TABS Take 2 tablets by mouth at bedtime. Reported on 12/26/2015     escitalopram (LEXAPRO) 10 MG tablet Take 10 mg by mouth daily.     levothyroxine (SYNTHROID, LEVOTHROID) 50 MCG tablet Take 50 mcg by mouth daily before breakfast. Reported on 12/26/2015     losartan (COZAAR) 25 MG tablet Take 1 tablet (25 mg total) by mouth daily. 30 tablet 3   rosuvastatin (CRESTOR) 20 MG tablet Take 20 mg by mouth daily.     albuterol (VENTOLIN HFA) 108 (90 Base) MCG/ACT inhaler Inhale 2  puffs into the lungs every 4 (four) hours as needed. (Patient not taking: Reported on 01/03/2024)     amoxicillin (AMOXIL) 500 MG capsule Take 500 mg by mouth as needed. Before dental visit (Patient not taking: Reported on 01/03/2024)     Cholecalciferol (VITAMIN D-3) 1000 UNITS CAPS Take by mouth daily. Reported on 12/26/2015 (Patient not taking: Reported on 01/03/2024)     meloxicam (MOBIC) 7.5 MG tablet Take 7.5 mg by mouth daily. (Patient not taking: Reported on 01/03/2024)     ondansetron (ZOFRAN) 4 MG tablet Take 1 tablet (4 mg total) by mouth every 6 (six) hours as needed for nausea or vomiting. 20 tablet 0   No facility-administered medications prior to visit.     Allergies:   Patient has no known allergies.   Social History   Socioeconomic History   Marital status: Divorced    Spouse name: Not on file   Number of children: Not on file   Years of education: Not on file   Highest education level: Not on file  Occupational History   Not on file  Tobacco Use   Smoking status: Every Day    Current packs/day: 0.50    Average packs/day: 0.5 packs/day for 50.0 years (25.0 ttl pk-yrs)    Types: Cigarettes   Smokeless tobacco: Never   Tobacco comments:    01/03/2024 Patient states one pack last about three days  Substance and Sexual Activity   Alcohol use: Yes    Alcohol/week: 7.0 standard drinks of alcohol    Types: 7 Standard drinks or equivalent per week   Drug use: No   Sexual activity: Never    Birth control/protection: Surgical  Other Topics Concern   Not on file  Social History Narrative   Not on file   Social Drivers of Health   Financial Resource Strain: Not on file  Food Insecurity: No Food Insecurity (08/19/2023)   Hunger Vital Sign    Worried About Running Out of Food in the Last Year: Never true    Ran Out of Food in the Last Year: Never true  Transportation Needs: No Transportation Needs (08/19/2023)   PRAPARE - Administrator, Civil Service  (Medical): No    Lack of Transportation (Non-Medical): No  Physical Activity: Not on file  Stress: Not on file  Social Connections: Not on file     Family History:  The patient's  family history includes Breast cancer in her maternal aunt; Cancer in her mother; Diabetes in her maternal grandmother; Heart disease in her father; Hypertension in her mother.   ROS:   Please see the history of present illness.    All other systems are reviewed and are negative.   PHYSICAL EXAM:   VS:  BP (!) 140/76  Pulse (!) 58   Ht 4\' 11"  (1.499 m)   Wt 109 lb (49.4 kg)   SpO2 93%   BMI 22.02 kg/m      General: Alert, oriented x3, no distress, she appears younger than stated age.  She is very petite and lean. Head: no evidence of trauma, PERRL, EOMI, no exophtalmos or lid lag, no myxedema, no xanthelasma; normal ears, nose and oropharynx Neck: normal jugular venous pulsations and no hepatojugular reflux; brisk carotid pulses without delay and unchanged bilateral carotid bruits Chest: clear to auscultation, no signs of consolidation by percussion or palpation, normal fremitus, symmetrical and full respiratory excursions Cardiovascular: normal position and quality of the apical impulse, regular rhythm, normal first and still has a distinct second heart sounds, 2-3/6 aortic ejection murmur is mid peaking and radiates towards both carotids no diastolic murmurs, rubs or gallops Abdomen: no tenderness or distention, no masses by palpation, no abnormal pulsatility or arterial bruits, normal bowel sounds, no hepatosplenomegaly Extremities: no clubbing, cyanosis or edema; 2+ radial, ulnar and brachial pulses bilaterally; 2+ right femoral, posterior tibial and dorsalis pedis pulses; 2+ left femoral, posterior tibial and dorsalis pedis pulses; no subclavian or femoral bruits Neurological: grossly nonfocal Psych: Normal mood and affect   Wt Readings from Last 3 Encounters:  01/03/24 109 lb (49.4 kg)  09/20/23  106 lb 6.4 oz (48.3 kg)  08/18/23 103 lb 9.9 oz (47 kg)    Studies/Labs Reviewed:   ECHO 08/18/2023:   1. Left ventricular ejection fraction, by estimation, is 65 to 70%. The  left ventricle has normal function. The left ventricle has no regional  wall motion abnormalities. There is moderate asymmetric left ventricular  hypertrophy of the septal segment.  Left ventricular diastolic parameters are consistent with Grade I  diastolic dysfunction (impaired relaxation). The average left ventricular  global longitudinal strain is -21.9 %. The global longitudinal strain is  normal.   2. Right ventricular systolic function is normal. The right ventricular  size is normal. Tricuspid regurgitation signal is inadequate for assessing  PA pressure.   3. The mitral valve is grossly normal. Trivial mitral valve  regurgitation. No evidence of mitral stenosis.   4. The aortic valve is abnormal. There is moderate calcification of the  aortic valve. Aortic valve regurgitation is mild. Mild to moderate aortic  valve stenosis. Aortic valve area, by VTI measures 0.73 cm. Aortic valve  mean gradient measures 19.0 mmHg.   Aortic valve Vmax measures 2.86 m/s.   5. The inferior vena cava is normal in size with greater than 50%  respiratory variability, suggesting right atrial pressure of 3 mmHg.   6. Cannot exclude a small PFO.    AV Area (VTI):     0.73 cm  AV Vmax:           286.00 cm/s  AV Peak Grad:      32.7 mmHg  AV Mean Grad:      19.0 mmHg  LVOT/AV VTI ratio: 0.32  AI PHT:            559 msec     Carotid Doppler 07/26/2023: Right Carotid: Velocities in the right ICA are consistent with a 1-39%  stenosis.   Left Carotid: Velocities in the left ICA are consistent with a 40-59%  stenosis. Non-hemodynamically significant plaque <50% noted in the  CCA. LICA  stenosis based on peak systolic velocities.   Vertebrals:  Bilateral vertebral arteries demonstrate antegrade flow.  Subclavians:  Normal flow hemodynamics were seen  in bilateral subclavian  arteries.    EKG: Personally reviewed the tracing from 09/20/2023 which shows sinus rhythm and questionable left atrial abnormality, otherwise normal tracing  EKG Interpretation Date/Time:    Ventricular Rate:    PR Interval:    QRS Duration:    QT Interval:    QTC Calculation:   R Axis:      Text Interpretation:            Latest Ref Rng & Units 08/20/2023    3:20 AM 08/19/2023    4:01 AM 08/18/2023    6:42 PM  BMP  Glucose 70 - 99 mg/dL 130  865  784   BUN 8 - 23 mg/dL 20  17  18    Creatinine 0.44 - 1.00 mg/dL 6.96  2.95  2.84   Sodium 135 - 145 mmol/L 136  135  131   Potassium 3.5 - 5.1 mmol/L 3.8  3.1  3.5   Chloride 98 - 111 mmol/L 105  103  97   CO2 22 - 32 mmol/L 22  20  22    Calcium 8.9 - 10.3 mg/dL 8.5  9.1  9.2     CBC:    Component Value Date/Time   WBC 6.3 08/20/2023 0320   HGB 13.3 08/20/2023 0320   HCT 42.9 08/20/2023 0320   PLT 119 (L) 08/20/2023 0320   MCV 96.4 08/20/2023 0320   NEUTROABS 13.3 (H) 08/18/2023 1842   LYMPHSABS 1.1 08/18/2023 1842   MONOABS 1.3 (H) 08/18/2023 1842   EOSABS 0.0 08/18/2023 1842   BASOSABS 0.1 08/18/2023 1842    Lipid Panel  No results found for: "CHOL", "TRIG", "HDL", "CHOLHDL", "VLDL", "LDLCALC", "LDLDIRECT", "LABVLDL"   Recent Labs: February 28, 2020  creatinine 1.2,  hemoglobin 15.3 normal liver function tests cholesterol 148, triglycerides 56, LDL 78, HDL 59.  03/18/2022 Cholesterol 113, HDL 44, LDL 49, triglycerides 101 Hemoglobin 13.4, creatinine 1.2, potassium 3.8, ALT 22, TSH 1.29  04/01/2023 Cholesterol 159, HDL 63, LDL 76, triglycerides 111  ASSESSMENT:    1. Nonrheumatic aortic valve stenosis   2. Carotid stenosis, asymptomatic, left   3. Essential hypertension   4. Hypercholesteremia   5. Aortic atherosclerosis (HCC)   6. Smoking        PLAN:  In order of problems listed above:  AS: She seems to be becoming symptomatic.  None  of her symptoms suggest an unstable coronary syndrome.  She has exertional dyspnea, climbing stairs to her apartment and occasionally also seems to have exertional angina.  Her echocardiogram in October was interpreted as showing mild-moderate aortic stenosis based on the gradients, but in fact stroke-volume index 32 , dimensionless valve index 0.32  and calculated aortic valve area 0.7 suggest paradoxical low-flow low gradient moderate-severe aortic stenosis.  Again reviewed the options of TAVR versus SAVR depending on whether or not she has coronary disease and/or aortic dilation and good peripheral access for stent valve.  Since she is symptomatic I think we should start her workup with a right and left heart catheterization to better characterize the aortic stenosis and to see whether her symptoms could be due to concomitant coronary artery disease.  Carotid stenosis: Carotid Doppler findings have been very stable over the last 4 years.  No neurological complaints. HTN: Blood pressure is a little high today but she was nervous.  Just a few days ago her blood pressure was 114/72. HLP: Lipid parameters are very close to target.  Continue statin. Aortic atherosclerosis: Imaging studies show  evidence of extensive atherosclerotic plaque in the aorta, iliac arteries and abdominal visceral arteries but she has had no symptoms related to this or any intermittent claudication.   Raynaud's syndrome: She has not had any symptoms of this in years. Smoking:  once again strongly recommend complete and permanent smoking cessation  Informed Consent   Shared Decision Making/Informed Consent The risks [stroke (1 in 1000), death (1 in 1000), kidney failure [usually temporary] (1 in 500), bleeding (1 in 200), allergic reaction [possibly serious] (1 in 200)], benefits (diagnostic support and management of coronary artery disease) and alternatives of a cardiac catheterization were discussed in detail with Gina Harrell and she  is willing to proceed.       Medication Adjustments/Labs and Tests Ordered: Current medicines are reviewed at length with the patient today.  Concerns regarding medicines are outlined above.  Medication changes, Labs and Tests ordered today are listed in the Patient Instructions below. Patient Instructions  Medication Instructions:  No changes *If you need a refill on your cardiac medications before your next appointment, please call your pharmacy*   Lab Work: BMET, CBC  If you have labs (blood work) drawn today and your tests are completely normal, you will receive your results only by: MyChart Message (if you have MyChart) OR A paper copy in the mail If you have any lab test that is abnormal or we need to change your treatment, we will call you to review the results.   Testing/Procedures:       Cardiac/Peripheral Catheterization   You are scheduled for a on January 31, 2024  , with Dr. Lynnette Caffey .  1. Please arrive at the [facility] (Main Entrance A) at [facility]: [address] [place], [place] [address] at 7:00 am (This time is 2 hour(s) before your procedure to ensure your preparation).   Free valet parking service is available. You will check in at ADMITTING. The support person will be asked to wait in the waiting room.  It is OK to have someone drop you off and come back when you are ready to be discharged.        Special note: Every effort is made to have your procedure done on time. Please understand that emergencies sometimes delay scheduled procedures.  2. Diet: Do not eat solid foods after midnight.  You may have clear liquids until 5 AM the day of the procedure.  3. Labs: You will need to have blood drawn on , at . You do not need to be fasting. 4. Medication instructions in preparation for your procedure:   Contrast Allergy: no     Do not take Losartan the morning procedure.     On the morning of your procedure, take  Aspirin 81 mg  ( ONLY That  MORNING per Dr Royann Shivers) and any morning medicines NOT listed above.  You may use sips of water.  5. Plan to go home the same day, you will only stay overnight if medically necessary. 6. You MUST have a responsible adult to drive you home. 7. An adult MUST be with you the first 24 hours after you arrive home. 8. Bring a current list of your medications, and the last time and date medication taken. 9. Bring ID and current insurance cards. 10.Please wear clothes that are easy to get on and off and wear slip-on shoes.  Thank you for allowing [place] to care for you!   -- [facility]    Follow-Up:  At [facility], you and your health needs are our priority.  As part of our continuing mission to provide you with exceptional heart care, we have created designated Provider Care Teams.  These Care Teams include your primary Cardiologist (physician) and Advanced Practice Providers (APPs -  Physician Assistants and Nurse Practitioners) who all work together to provide you with the care you need, when you need it.  We recommend signing up for the patient portal called "MyChart".  Sign up information is provided on this After Visit Summary.  MyChart is used to connect with patients for Virtual Visits (Telemedicine).  Patients are able to view lab/test results, encounter notes, upcoming appointments, etc.  Non-urgent messages can be sent to your provider as well.   To learn more about what you can do with MyChart, go to [email or web address].    Your next appointment:   2 months   Provider:    Dr Royann Shivers   Signed, Thurmon Fair, MD  01/03/2024 11:44 AM    [facility] [address] [place], [place], [place]  [address] Phone: [phone]; Fax: [phone]

## 2024-01-04 ENCOUNTER — Encounter: Payer: Self-pay | Admitting: Cardiovascular Disease

## 2024-01-05 LAB — CBC
Hematocrit: 45.3 % (ref 34.0–46.6)
Hemoglobin: 15.2 g/dL (ref 11.1–15.9)
MCH: 30 pg (ref 26.6–33.0)
MCHC: 33.6 g/dL (ref 31.5–35.7)
MCV: 90 fL (ref 79–97)
Platelets: 152 10*3/uL (ref 150–450)
RBC: 5.06 x10E6/uL (ref 3.77–5.28)
RDW: 12.2 % (ref 11.7–15.4)
WBC: 8.7 10*3/uL (ref 3.4–10.8)

## 2024-01-05 LAB — BASIC METABOLIC PANEL
BUN/Creatinine Ratio: 18 (ref 12–28)
BUN: 20 mg/dL (ref 8–27)
Calcium: 10 mg/dL (ref 8.7–10.3)
Chloride: 104 mmol/L (ref 96–106)
Creatinine, Ser: 1.1 mg/dL — ABNORMAL HIGH (ref 0.57–1.00)
Glucose: 82 mg/dL (ref 70–99)
Potassium: 4.6 mmol/L (ref 3.5–5.2)
Sodium: 142 mmol/L (ref 134–144)
eGFR: 50 mL/min/{1.73_m2} — ABNORMAL LOW (ref >59)

## 2024-01-27 ENCOUNTER — Telehealth: Payer: Self-pay | Admitting: *Deleted

## 2024-01-27 NOTE — Telephone Encounter (Signed)
 Cardiac Catheterization scheduled at [facility] for: Monday January 31, 2024 7:30 AM Arrival time [facility] Main Entrance A at: 5:30 AM  Nothing to eat after midnight prior to procedure, clear liquids until 5 AM day of procedure.  Medication instructions: -Hold:  Losartan-day before and day of procedure -per protocol GFR <60 (50)  Mobic-day before and day of procedure-per protocol GFR < 60 pt has not taken -Other usual morning medications can be taken with sips of water including aspirin 81 mg.  Plan to go home the same day, you will only stay overnight if medically necessary.  You must have responsible adult to drive you home.  Someone must be with you the first 24 hours after you arrive home.  Reviewed procedure instructions with patient.

## 2024-01-31 ENCOUNTER — Encounter (HOSPITAL_COMMUNITY)
Admission: RE | Disposition: A | Discharge status: Home / Self Care | Payer: Self-pay | Source: Home / Self Care | Attending: Internal Medicine

## 2024-01-31 ENCOUNTER — Other Ambulatory Visit: Payer: Self-pay

## 2024-01-31 ENCOUNTER — Ambulatory Visit (HOSPITAL_COMMUNITY)
Admission: RE | Admit: 2024-01-31 | Discharge: 2024-01-31 | Disposition: A | Discharge status: Home / Self Care | Payer: Medicare Other | Attending: Internal Medicine | Admitting: Internal Medicine

## 2024-01-31 DIAGNOSIS — Z79899 Other long term (current) drug therapy: Secondary | ICD-10-CM | POA: Insufficient documentation

## 2024-01-31 DIAGNOSIS — E7800 Pure hypercholesterolemia, unspecified: Secondary | ICD-10-CM | POA: Insufficient documentation

## 2024-01-31 DIAGNOSIS — I350 Nonrheumatic aortic (valve) stenosis: Secondary | ICD-10-CM | POA: Insufficient documentation

## 2024-01-31 DIAGNOSIS — I700 Atherosclerosis of aorta: Secondary | ICD-10-CM | POA: Diagnosis not present

## 2024-01-31 DIAGNOSIS — I10 Essential (primary) hypertension: Secondary | ICD-10-CM | POA: Diagnosis not present

## 2024-01-31 DIAGNOSIS — I6522 Occlusion and stenosis of left carotid artery: Secondary | ICD-10-CM | POA: Insufficient documentation

## 2024-01-31 DIAGNOSIS — I7300 Raynaud's syndrome without gangrene: Secondary | ICD-10-CM | POA: Insufficient documentation

## 2024-01-31 DIAGNOSIS — E785 Hyperlipidemia, unspecified: Secondary | ICD-10-CM | POA: Insufficient documentation

## 2024-01-31 DIAGNOSIS — I2510 Atherosclerotic heart disease of native coronary artery without angina pectoris: Secondary | ICD-10-CM | POA: Diagnosis not present

## 2024-01-31 DIAGNOSIS — F17210 Nicotine dependence, cigarettes, uncomplicated: Secondary | ICD-10-CM | POA: Insufficient documentation

## 2024-01-31 DIAGNOSIS — I272 Pulmonary hypertension, unspecified: Secondary | ICD-10-CM | POA: Insufficient documentation

## 2024-01-31 HISTORY — PX: CORONARY PRESSURE/FFR WITH 3D MAPPING: CATH118309

## 2024-01-31 HISTORY — PX: RIGHT/LEFT HEART CATH AND CORONARY ANGIOGRAPHY: CATH118266

## 2024-01-31 LAB — POCT I-STAT 7, (LYTES, BLD GAS, ICA,H+H)
Acid-base deficit: 5 mmol/L — ABNORMAL HIGH (ref 0.0–2.0)
Bicarbonate: 19.7 mmol/L — ABNORMAL LOW (ref 20.0–28.0)
Calcium, Ion: 1.17 mmol/L (ref 1.15–1.40)
HCT: 38 % (ref 36.0–46.0)
Hemoglobin: 12.9 g/dL (ref 12.0–15.0)
O2 Saturation: 92 %
Potassium: 3.6 mmol/L (ref 3.5–5.1)
Sodium: 138 mmol/L (ref 135–145)
TCO2: 21 mmol/L — ABNORMAL LOW (ref 22–32)
pCO2 arterial: 36.5 mmHg (ref 32–48)
pH, Arterial: 7.34 — ABNORMAL LOW (ref 7.35–7.45)
pO2, Arterial: 68 mmHg — ABNORMAL LOW (ref 83–108)

## 2024-01-31 LAB — POCT I-STAT EG7
Acid-base deficit: 4 mmol/L — ABNORMAL HIGH (ref 0.0–2.0)
Acid-base deficit: 9 mmol/L — ABNORMAL HIGH (ref 0.0–2.0)
Bicarbonate: 18.6 mmol/L — ABNORMAL LOW (ref 20.0–28.0)
Bicarbonate: 22.5 mmol/L (ref 20.0–28.0)
Calcium, Ion: 1.01 mmol/L — ABNORMAL LOW (ref 1.15–1.40)
Calcium, Ion: 1.23 mmol/L (ref 1.15–1.40)
HCT: 36 % (ref 36.0–46.0)
HCT: 38 % (ref 36.0–46.0)
Hemoglobin: 12.2 g/dL (ref 12.0–15.0)
Hemoglobin: 12.9 g/dL (ref 12.0–15.0)
O2 Saturation: 55 %
O2 Saturation: 61 %
Potassium: 3.1 mmol/L — ABNORMAL LOW (ref 3.5–5.1)
Potassium: 3.8 mmol/L (ref 3.5–5.1)
Sodium: 130 mmol/L — ABNORMAL LOW (ref 135–145)
Sodium: 138 mmol/L (ref 135–145)
TCO2: 20 mmol/L — ABNORMAL LOW (ref 22–32)
TCO2: 24 mmol/L (ref 22–32)
pCO2, Ven: 46.1 mmHg (ref 44–60)
pCO2, Ven: 47.4 mmHg (ref 44–60)
pH, Ven: 7.214 — ABNORMAL LOW (ref 7.25–7.43)
pH, Ven: 7.284 (ref 7.25–7.43)
pO2, Ven: 35 mmHg (ref 32–45)
pO2, Ven: 36 mmHg (ref 32–45)

## 2024-01-31 SURGERY — RIGHT/LEFT HEART CATH AND CORONARY ANGIOGRAPHY
Anesthesia: LOCAL

## 2024-01-31 MED ORDER — MIDAZOLAM HCL 2 MG/2ML IJ SOLN
INTRAMUSCULAR | Status: AC
Start: 2024-01-31 — End: ?
  Filled 2024-01-31: qty 2

## 2024-01-31 MED ORDER — MIDAZOLAM HCL 2 MG/2ML IJ SOLN
INTRAMUSCULAR | Status: AC
  Filled 2024-01-31: qty 2

## 2024-01-31 MED ORDER — HEPARIN (PORCINE) IN NACL 1000-0.9 UT/500ML-% IV SOLN
INTRAVENOUS | Status: DC | PRN
  Administered 2024-01-31: 1000 mL

## 2024-01-31 MED ORDER — HEPARIN SODIUM (PORCINE) 1000 UNIT/ML IJ SOLN
INTRAMUSCULAR | Status: DC | PRN
  Administered 2024-01-31: 5000 [IU] via INTRA_ARTERIAL

## 2024-01-31 MED ORDER — LABETALOL HCL 5 MG/ML IV SOLN: 10.0000 mg | INTRAVENOUS | Status: DC | PRN

## 2024-01-31 MED ORDER — LIDOCAINE HCL (PF) 1 % IJ SOLN
INTRAMUSCULAR | Status: DC | PRN
  Administered 2024-01-31: 5 mL
  Administered 2024-01-31: 2 mL

## 2024-01-31 MED ORDER — FUROSEMIDE 20 MG PO TABS: 20.0000 mg | ORAL_TABLET | Freq: Two times a day (BID) | ORAL | 11 refills | Status: DC

## 2024-01-31 MED ORDER — HEPARIN SODIUM (PORCINE) 1000 UNIT/ML IJ SOLN
INTRAMUSCULAR | Status: AC
  Filled 2024-01-31: qty 10

## 2024-01-31 MED ORDER — ACETAMINOPHEN 325 MG PO TABS: 650.0000 mg | ORAL_TABLET | ORAL | Status: DC | PRN

## 2024-01-31 MED ORDER — IOHEXOL 350 MG/ML SOLN
INTRAVENOUS | Status: DC | PRN
  Administered 2024-01-31: 73 mL

## 2024-01-31 MED ORDER — ASPIRIN 81 MG PO CHEW: 81.0000 mg | CHEWABLE_TABLET | ORAL | Status: DC

## 2024-01-31 MED ORDER — VERAPAMIL HCL 2.5 MG/ML IV SOLN
INTRAVENOUS | Status: AC
  Filled 2024-01-31: qty 2

## 2024-01-31 MED ORDER — SODIUM CHLORIDE 0.9 % WEIGHT BASED INFUSION: 1.0000 mL/kg/h | INTRAVENOUS | Status: DC

## 2024-01-31 MED ORDER — IOPAMIDOL (ISOVUE-370) INJECTION 76%
INTRAVENOUS | Status: DC | PRN
Start: 2024-01-31 — End: 2024-01-31

## 2024-01-31 MED ORDER — SODIUM CHLORIDE 0.9 % WEIGHT BASED INFUSION
3.0000 mL/kg/h | INTRAVENOUS | Status: AC
  Administered 2024-01-31: 3 mL/kg/h via INTRAVENOUS

## 2024-01-31 MED ORDER — SODIUM CHLORIDE 0.9% FLUSH: 3.0000 mL | Freq: Two times a day (BID) | INTRAVENOUS | Status: DC

## 2024-01-31 MED ORDER — SODIUM CHLORIDE 0.9% FLUSH: 3.0000 mL | INTRAVENOUS | Status: DC | PRN

## 2024-01-31 MED ORDER — HYDRALAZINE HCL 20 MG/ML IJ SOLN: 10.0000 mg | INTRAMUSCULAR | Status: DC | PRN

## 2024-01-31 MED ORDER — FENTANYL CITRATE (PF) 100 MCG/2ML IJ SOLN
INTRAMUSCULAR | Status: AC
  Filled 2024-01-31: qty 2

## 2024-01-31 MED ORDER — LIDOCAINE HCL (PF) 1 % IJ SOLN
INTRAMUSCULAR | Status: AC
  Filled 2024-01-31: qty 30

## 2024-01-31 MED ORDER — SODIUM CHLORIDE 0.9 % IV SOLN: 250.0000 mL | INTRAVENOUS | Status: DC | PRN

## 2024-01-31 MED ORDER — VERAPAMIL HCL 2.5 MG/ML IV SOLN
INTRAVENOUS | Status: DC | PRN
  Administered 2024-01-31: 10 mL via INTRA_ARTERIAL

## 2024-01-31 MED ORDER — FENTANYL CITRATE (PF) 100 MCG/2ML IJ SOLN
INTRAMUSCULAR | Status: DC | PRN
  Administered 2024-01-31 (×6): 25 ug via INTRAVENOUS

## 2024-01-31 MED ORDER — MIDAZOLAM HCL 2 MG/2ML IJ SOLN
INTRAMUSCULAR | Status: DC | PRN
  Administered 2024-01-31 (×6): 1 mg via INTRAVENOUS

## 2024-01-31 MED ORDER — ONDANSETRON HCL 4 MG/2ML IJ SOLN: 4.0000 mg | Freq: Four times a day (QID) | INTRAMUSCULAR | Status: DC | PRN

## 2024-01-31 SURGICAL SUPPLY — 18 items
CARD KEY FFR CATHWORX (MISCELLANEOUS) IMPLANT
CATH DIAG 6FR JR4 (CATHETERS) IMPLANT
CATH INFINITI AMBI 6FR TG (CATHETERS) IMPLANT
CATH LANGSTON DUAL LUM PIG 6FR (CATHETERS) IMPLANT
CATH SWAN GANZ 7F STRAIGHT (CATHETERS) IMPLANT
DEVICE RAD COMP TR BAND LRG (VASCULAR PRODUCTS) IMPLANT
FFR CATHWORX KEY CARD (MISCELLANEOUS) ×1 IMPLANT
GLIDESHEATH SLEND SS 6F .021 (SHEATH) IMPLANT
GLIDESHEATH SLENDER 7FR .021G (SHEATH) IMPLANT
GUIDEWIRE .025 260CM (WIRE) IMPLANT
PACK CARDIAC CATHETERIZATION (CUSTOM PROCEDURE TRAY) ×1 IMPLANT
SET ATX-X65L (MISCELLANEOUS) IMPLANT
SHEATH PROBE COVER 6X72 (BAG) IMPLANT
TRANSDUCER W/STOPCOCK (MISCELLANEOUS) IMPLANT
TUBING ART PRESS 72 MALE/FEM (TUBING) IMPLANT
WIRE EMERALD 3MM-J .035X260CM (WIRE) IMPLANT
WIRE EMERALD ST .035X150CM (WIRE) IMPLANT
WIRE MICRO SET SILHO 5FR 7 (SHEATH) IMPLANT

## 2024-01-31 NOTE — Discharge Instructions (Signed)

## 2024-01-31 NOTE — Interval H&P Note (Signed)
 History and Physical Interval Note:  01/31/2024 7:14 AM  Gina Harrell  has presented today for surgery, with the diagnosis of aortic stenosis.  The various methods of treatment have been discussed with the patient and family. After consideration of risks, benefits and other options for treatment, the patient has consented to  Procedure(s): RIGHT/LEFT HEART CATH AND CORONARY ANGIOGRAPHY (N/A) as a surgical intervention.  The patient's history has been reviewed, patient examined, no change in status, stable for surgery.  I have reviewed the patient's chart and labs.  Questions were answered to the patient's satisfaction.     Orbie Pyo

## 2024-01-31 NOTE — Progress Notes (Signed)
 Patient and son was given discharge instructions. Both verbalized understanding.

## 2024-02-01 ENCOUNTER — Encounter (HOSPITAL_COMMUNITY): Payer: Self-pay | Admitting: Internal Medicine

## 2024-02-03 NOTE — Progress Notes (Signed)
 Patient ID: Gina Harrell MRN: [id number] DOB/AGE: 12/06/1940 83 y.o.  Primary Care Physician:Aronson, Richard, MD Primary Cardiologist: Croitoru  CC:  Aortic valvular disease management     FOCUSED PROBLEM LIST:   Aortic stenosis Indexed aortic valve area of 0.5 cm/m by catheterization March 2025 EKG sinus rhythm without bundle-branch blocks Coronary artery disease Moderate LAD and RCA disease by FFR angiography March 2025 Pulmonary hypertension Mixed; elevated wedge pressure and elevated PVR of 5.6 Woods units Hypertension Hyperlipidemia Aortic atherosclerosis CT renal stone study 2024 CKD stage IIIa BMI 22/BSA 1.11 January 2024:   The patient is here today for recommendations regarding her severe aortic stenosis.  She was last seen by her primary cardiologist in February and was endorsing shortness of breath with exertion as well as some chest discomfort.  She has 18 stairs to walk up to get to her home.  She notices that on walking up the stairs she does get short of breath.  She has noticed that she is slowing down over the last several months.  Initially she believed that this was due to smoking but things have gotten progressively worse over the last several months.  She underwent coronary angiography and right heart catheterization in response to an echocardiogram which suggested progression of aortic stenosis.  She does endorse a chest tightness with exertion at times.  This is not routine.  She occasionally gets dizzy when she goes from sitting to standing quickly but interestingly she feels dizzy just standing.  She denies any paroxysmal nocturnal dyspnea, orthopnea.  She on admission is very high functioning and high energy.  She would not like to experience any limitation in her activities of daily living if possible.  She lives by herself.  She works at [employer].  She has had some dental work done in the form of a root canal a few weeks ago.  She  has recovered from this.  She has no further dental work planned.         Past Medical History:  Diagnosis Date   Cancer [facility])    Bladder cancer   Elevated cholesterol    GERD (gastroesophageal reflux disease)    Heart murmur    Heart murmur    Hypertension    Hypothyroidism    Osteoporosis    Uterine prolapse     Past Surgical History:  Procedure Laterality Date   BLADDER TUMOR EXCISION     BREAST EXCISIONAL BIOPSY Right    CAROTID DOPPLER  2013   COLONOSCOPY WITH PROPOFOL N/A 02/17/2016   Procedure: COLONOSCOPY WITH PROPOFOL;  Surgeon: Charolett Bumpers, MD;  Location: [facility];  Service: Endoscopy;  Laterality: N/A;   CORONARY PRESSURE/FFR WITH 3D MAPPING N/A 01/31/2024   Procedure: Coronary Pressure/FFR w/3D Mapping;  Surgeon: Orbie Pyo, MD;  Location: [facility];  Service: Cardiovascular;  Laterality: N/A;   DOPPLER ECHOCARDIOGRAPHY  2013   ESOPHAGOGASTRODUODENOSCOPY (EGD) WITH PROPOFOL N/A 02/17/2016   Procedure: ESOPHAGOGASTRODUODENOSCOPY (EGD) WITH PROPOFOL;  Surgeon: Charolett Bumpers, MD;  Location: [facility];  Service: Endoscopy;  Laterality: N/A;   RIGHT/LEFT HEART CATH AND CORONARY ANGIOGRAPHY N/A 01/31/2024   Procedure: RIGHT/LEFT HEART CATH AND CORONARY ANGIOGRAPHY;  Surgeon: Orbie Pyo, MD;  Location: [facility] CV [facility];  Service: Cardiovascular;  Laterality: N/A;   VAGINAL HYSTERECTOMY      Family History  Problem Relation Age of Onset   Cancer Mother        Colon cancer  Hypertension Mother    Heart disease Father    Breast cancer Maternal Aunt        Age 59's   Diabetes Maternal Grandmother     Social History   Socioeconomic History   Marital status: Divorced    Spouse name: Not on file   Number of children: Not on file   Years of education: Not on file   Highest education level: Not on file  Occupational History   Not on file  Tobacco Use   Smoking status: Every Day    Current packs/day: 0.50    Average packs/day:  0.5 packs/day for 50.0 years (25.0 ttl pk-yrs)    Types: Cigarettes   Smokeless tobacco: Never   Tobacco comments:    01/03/2024 Patient states one pack last about three days  Substance and Sexual Activity   Alcohol use: Not Currently   Drug use: No   Sexual activity: Never    Birth control/protection: Surgical  Other Topics Concern   Not on file  Social History Narrative   Not on file   Social Drivers of Health   Financial Resource Strain: Not on file  Food Insecurity: No Food Insecurity (08/19/2023)   Hunger Vital Sign    Worried About Running Out of Food in the Last Year: Never true    Ran Out of Food in the Last Year: Never true  Transportation Needs: No Transportation Needs (08/19/2023)   PRAPARE - Administrator, Civil Service (Medical): No    Lack of Transportation (Non-Medical): No  Physical Activity: Not on file  Stress: Not on file  Social Connections: Not on file  Intimate Partner Violence: Not At Risk (08/19/2023)   Humiliation, Afraid, Rape, and Kick questionnaire    Fear of Current or Ex-Partner: No    Emotionally Abused: No    Physically Abused: No    Sexually Abused: No     Prior to Admission medications   Medication Sig Start Date End Date Taking? Authorizing Provider  albuterol (VENTOLIN HFA) 108 (90 Base) MCG/ACT inhaler Inhale 1 puff into the lungs every 4 (four) hours as needed for shortness of breath. 06/04/23   [provider]  ALPRAZolam Prudy Feeler) 0.5 MG tablet Take 0.5 mg by mouth at bedtime.     [provider]  amoxicillin (AMOXIL) 500 MG capsule Take 2,000 mg by mouth See admin instructions. Take 1 hour prior to dental visit 12/13/23   [provider]  Cholecalciferol 50 MCG (2000 UT) CAPS Take 2,000 Units by mouth daily.    [provider]  CRANBERRY-VITAMIN C PO Take 2 tablets by mouth daily.    [provider]  diphenhydramine-acetaminophen (TYLENOL PM) 25-500 MG TABS Take 2 tablets by mouth  at bedtime. Reported on 12/26/2015    [provider]  escitalopram (LEXAPRO) 10 MG tablet Take 5 mg by mouth at bedtime. 08/02/22   [provider]  furosemide (LASIX) 20 MG tablet Take 1 tablet (20 mg total) by mouth 2 (two) times daily. 01/31/24 01/30/25  Orbie Pyo, MD  levothyroxine (SYNTHROID, LEVOTHROID) 50 MCG tablet Take 50 mcg by mouth daily before breakfast.    [provider]  losartan (COZAAR) 25 MG tablet Take 1 tablet (25 mg total) by mouth daily. 08/20/23   Rai, Ripudeep K, MD  meloxicam (MOBIC) 7.5 MG tablet Take 7.5 mg by mouth daily as needed for pain. 12/27/23   [provider]  rosuvastatin (CRESTOR) 20 MG tablet Take 20 mg  by mouth daily.    [provider]    No Known Allergies  REVIEW OF SYSTEMS:  General: no fevers/chills/night sweats Eyes: no blurry vision, diplopia, or amaurosis ENT: no sore throat or hearing loss Resp: no cough, wheezing, or hemoptysis CV: no edema or palpitations GI: no abdominal pain, nausea, vomiting, diarrhea, or constipation GU: no dysuria, frequency, or hematuria Skin: no rash Neuro: no headache, numbness, tingling, or weakness of extremities Musculoskeletal: no joint pain or swelling Heme: no bleeding, DVT, or easy bruising Endo: no polydipsia or polyuria  BP 120/76   Pulse 74   Ht 4' 11.5" (1.511 m)   Wt 103 lb (46.7 kg)   SpO2 97%   BMI 20.46 kg/m   PHYSICAL EXAM: GEN:  AO x 3 in no acute distress HEENT: normal Dentition: Normal Neck: JVP normal. +2 carotid upstrokes without bruits. No thyromegaly. Lungs: equal expansion, clear bilaterally CV: Apex is discrete and nondisplaced, RRR with 3 out of 6 systolic murmur Abd: soft, non-tender, non-distended; no bruit; positive bowel sounds Ext: no edema, ecchymoses, or cyanosis Vascular: 2+ femoral pulses, 2+ radial pulses       Skin: warm and dry without rash Neuro: CN II-XII grossly intact; motor and sensory grossly  intact    DATA AND STUDIES:  EKG: March 2025 EKG demonstrates sinus rhythm without bundle-branch blocks  EKG Interpretation Date/Time:    Ventricular Rate:    PR Interval:    QRS Duration:    QT Interval:    QTC Calculation:   R Axis:      Text Interpretation:          Cardiac Studies & Procedures   ______________________________________________________________________________________________ CARDIAC CATHETERIZATION  CARDIAC CATHETERIZATION 01/31/2024  Narrative   Prox LAD lesion is 50% stenosed.   Mid LAD lesion is 30% stenosed.   Prox RCA lesion is 50% stenosed.  1.  Moderate disease of the proximal LAD and right coronary arteries.  FFR angiography (3D mapping) assessment of the proximal LAD was 0.81 and of the proximal RCA was 0.93 so further interventions were deferred. 2.  Fick cardiac output of 3.0 L/min and Fick cardiac index of 2.1 L/min/m with the following hemodynamics: Right atrial pressure mean of 12 mmHg Right ventricular pressure 45/11 with an end-diastolic pressure of 19 mmHg Wedge pressure mean of 13 mmHg with V waves to 23 mmHg PA pressure 40/23 with a mean of 30 mmHg PVR 5.6 Woods units PA pulsatility index of 1.4 3.  Mixed pulmonary hypertension with elevated wedge pressure and elevated PVR consistent with pulmonary arterial hypertension. 4.  Simultaneous aortic and left ventricular pressures were assessed with a Langston catheter resulting in a mean gradient of 26 mmHg, aortic valve area of 0.71 cm, and indexed aortic valve area of 0.5 cm/m consistent with severe aortic stenosis. 5.  Capacious iliofemoral vessels bilaterally; of note the bifurcations bilaterally are quite high and if a transcatheter aortic valve replacement is pursued the right femoral approach would be preferred.  Recommendation: Continue evaluation for aortic valve intervention.   Will start Lasix 20 mg daily for elevated filling pressures.  The results were reviewed with Dr.  Royann Shivers.  Findings Coronary Findings Diagnostic  Dominance: Right  Left Anterior Descending The vessel exhibits minimal luminal irregularities. Prox LAD lesion is 50% stenosed. Mid LAD lesion is 30% stenosed.  Right Coronary Artery Prox RCA lesion is 50% stenosed.  Intervention  No interventions have been documented.     ECHOCARDIOGRAM  ECHOCARDIOGRAM COMPLETE 08/18/2023  Narrative  ECHOCARDIOGRAM REPORT    Patient Name:   Gina Harrell Date of Exam: 08/18/2023 Medical Rec #:  [id number]         Height:       59.0 in Accession #:    [id number]        Weight:       105.0 lb Date of Birth:  1940-12-02        BSA:          1.403 m Patient Age:    81 years          BP:           159/78 mmHg Patient Gender: F                 HR:           71 bpm. Exam Location:  [address]  Procedure: 2D Echo, 3D Echo, Cardiac Doppler, Color Doppler and Strain Analysis  Indications:    I35.0 Nonrheumatic aortic (valve) stenosis  History:        Patient has prior history of Echocardiogram examinations, most recent 07/11/2020. Risk Factors:Hypertension and HLD.  Sonographer:    Clearence Ped RCS Referring Phys: [phone] MIHAI CROITORU  IMPRESSIONS   1. Left ventricular ejection fraction, by estimation, is 65 to 70%. The left ventricle has normal function. The left ventricle has no regional wall motion abnormalities. There is moderate asymmetric left ventricular hypertrophy of the septal segment. Left ventricular diastolic parameters are consistent with Grade I diastolic dysfunction (impaired relaxation). The average left ventricular global longitudinal strain is -21.9 %. The global longitudinal strain is normal. 2. Right ventricular systolic function is normal. The right ventricular size is normal. Tricuspid regurgitation signal is inadequate for assessing PA pressure. 3. The mitral valve is grossly normal. Trivial mitral valve regurgitation. No evidence of mitral stenosis. 4. The aortic  valve is abnormal. There is moderate calcification of the aortic valve. Aortic valve regurgitation is mild. Mild to moderate aortic valve stenosis. Aortic valve area, by VTI measures 0.73 cm. Aortic valve mean gradient measures 19.0 mmHg. Aortic valve Vmax measures 2.86 m/s. 5. The inferior vena cava is normal in size with greater than 50% respiratory variability, suggesting right atrial pressure of 3 mmHg. 6. Cannot exclude a small PFO.  FINDINGS Left Ventricle: Left ventricular ejection fraction, by estimation, is 65 to 70%. The left ventricle has normal function. The left ventricle has no regional wall motion abnormalities. The average left ventricular global longitudinal strain is -21.9 %. The global longitudinal strain is normal. 3D ejection fraction reviewed and evaluated as part of the interpretation. Alternate measurement of EF is felt to be most reflective of LV function. The left ventricular internal cavity size was normal in size. There is moderate asymmetric left ventricular hypertrophy of the septal segment. Left ventricular diastolic parameters are consistent with Grade I diastolic dysfunction (impaired relaxation).  Right Ventricle: The right ventricular size is normal. No increase in right ventricular wall thickness. Right ventricular systolic function is normal. Tricuspid regurgitation signal is inadequate for assessing PA pressure.  Left Atrium: Left atrial size was normal in size.  Right Atrium: Right atrial size was normal in size.  Pericardium: Trivial pericardial effusion is present.  Mitral Valve: The mitral valve is grossly normal. Trivial mitral valve regurgitation. No evidence of mitral valve stenosis.  Tricuspid Valve: The tricuspid valve is normal in structure. Tricuspid valve regurgitation is trivial. No evidence of tricuspid stenosis.  Aortic Valve: The aortic valve is abnormal.  There is moderate calcification of the aortic valve. Aortic valve regurgitation is  mild. Aortic regurgitation PHT measures 559 msec. Mild to moderate aortic stenosis is present. Aortic valve mean gradient measures 19.0 mmHg. Aortic valve peak gradient measures 32.7 mmHg. Aortic valve area, by VTI measures 0.73 cm.  Pulmonic Valve: The pulmonic valve was normal in structure. Pulmonic valve regurgitation is trivial. No evidence of pulmonic stenosis.  Aorta: The aortic root is normal in size and structure.  Venous: The inferior vena cava is normal in size with greater than 50% respiratory variability, suggesting right atrial pressure of 3 mmHg.  IAS/Shunts: Cannot exclude a small PFO.   LEFT VENTRICLE PLAX 2D LVIDd:         3.40 cm   Diastology LVIDs:         2.10 cm   LV e' medial:    6.31 cm/s LV PW:         1.00 cm   LV E/e' medial:  8.6 LV IVS:        1.30 cm   LV e' lateral:   9.68 cm/s LVOT diam:     1.70 cm   LV E/e' lateral: 5.6 LV SV:         45 LV SV Index:   32        2D Longitudinal Strain LVOT Area:     2.27 cm  2D Strain GLS Avg:     -21.9 %  3D Volume EF: 3D EF:        57 % LV EDV:       64 ml LV ESV:       27 ml LV SV:        36 ml  RIGHT VENTRICLE RV Basal diam:  2.40 cm RV S prime:     17.20 cm/s TAPSE (M-mode): 1.5 cm  LEFT ATRIUM             Index        RIGHT ATRIUM          Index LA diam:        2.90 cm 2.07 cm/m   RA Area:     8.36 cm LA Vol (A2C):   29.5 ml 21.03 ml/m  RA Volume:   13.30 ml 9.48 ml/m LA Vol (A4C):   23.3 ml 16.61 ml/m LA Biplane Vol: 26.9 ml 19.18 ml/m AORTIC VALVE AV Area (Vmax):    0.71 cm AV Area (Vmean):   0.71 cm AV Area (VTI):     0.73 cm AV Vmax:           286.00 cm/s AV Vmean:          206.000 cm/s AV VTI:            0.625 m AV Peak Grad:      32.7 mmHg AV Mean Grad:      19.0 mmHg LVOT Vmax:         89.60 cm/s LVOT Vmean:        64.500 cm/s LVOT VTI:          0.200 m LVOT/AV VTI ratio: 0.32 AI PHT:            559 msec  AORTA Ao Root diam: 2.70 cm Ao Asc diam:  2.90 cm  MITRAL  VALVE MV Area (PHT):             SHUNTS MV Decel Time:  Systemic VTI:  0.20 m MV E velocity: 54.00 cm/s  Systemic Diam: 1.70 cm MV A velocity: 80.70 cm/s MV E/A ratio:  0.67  Weston Brass MD Electronically signed by Weston Brass MD Signature Date/Time: 08/18/2023/12:38:59 PM    Final          ______________________________________________________________________________________________      08/18/2023: ALT 18 01/03/2024: BUN 20; Creatinine, Ser 1.10; Platelets 152 01/31/2024: Hemoglobin 12.2; Potassium 3.1; Sodium 130   STS RISK CALCULATOR: Pending  NHYA CLASS: 2    ASSESSMENT AND PLAN:   1. Nonrheumatic aortic valve stenosis   2. Coronary artery disease involving native coronary artery of native heart without angina pectoris   3. Pulmonary hypertension, unspecified (HCC)   4. Essential hypertension   5. Hyperlipidemia LDL goal <70   6. Aortic atherosclerosis (HCC)   7. Stage 3a chronic kidney disease (HCC)   8. Pre-procedure lab exam     Aortic stenosis: The patient has developed symptoms attributable to her aortic stenosis.  Her indexed aortic valve area by invasive measure was consistent with severe aortic stenosis.  Will refer the patient for a TAVR protocol CTA and surgical evaluation. CAD:  Moderate, patient is asymptomatic.   Pulmonary hypertension: Mixed etiology with elevated PVR and elevated wedge pressure.  May require diuretics.  Could consider referral to advanced heart failure after aortic stenosis has been managed. Hypertension: Continue losartan 25mg .  Blood pressure is well-controlled today. Hyperlipidemia:  Continue crestor 20mg . Aortic atherosclerosisis: Continue Crestor 20 mg daily. CKDIIIa:  Continue losartan 25mg  qday.   I have personally reviewed the patients imaging data as summarized above.  I have reviewed the natural history of aortic stenosis with the patient and family members who are present today. We have discussed  the limitations of medical therapy and the poor prognosis associated with symptomatic aortic stenosis. We have also reviewed potential treatment options, including palliative medical therapy, conventional surgical aortic valve replacement, and transcatheter aortic valve replacement. We discussed treatment options in the context of this patient's specific comorbid medical conditions.   All of the patient's questions were answered today. Will make further recommendations based on the results of studies outlined above.   I spent 55 minutes reviewing all clinical data during and prior to this visit including all relevant imaging studies, laboratories, clinical information from other health systems and prior notes from both Cardiology and other specialties, interviewing the patient, conducting a complete physical examination, and coordinating care in order to formulate a comprehensive and personalized evaluation and treatment plan.   Orbie Pyo, MD  02/07/2024 5:12 PM    [facility] [address] [place], [place], [place]  [address] Phone: [phone]; Fax: [phone]

## 2024-02-07 ENCOUNTER — Ambulatory Visit: Attending: Internal Medicine | Admitting: Internal Medicine

## 2024-02-07 ENCOUNTER — Encounter: Payer: Self-pay | Admitting: Internal Medicine

## 2024-02-07 VITALS — BP 120/76 | HR 74 | Ht 59.5 in | Wt 103.0 lb

## 2024-02-07 DIAGNOSIS — Z01812 Encounter for preprocedural laboratory examination: Secondary | ICD-10-CM

## 2024-02-07 DIAGNOSIS — I272 Pulmonary hypertension, unspecified: Secondary | ICD-10-CM

## 2024-02-07 DIAGNOSIS — I2510 Atherosclerotic heart disease of native coronary artery without angina pectoris: Secondary | ICD-10-CM | POA: Diagnosis not present

## 2024-02-07 DIAGNOSIS — I350 Nonrheumatic aortic (valve) stenosis: Secondary | ICD-10-CM | POA: Diagnosis not present

## 2024-02-07 DIAGNOSIS — I10 Essential (primary) hypertension: Secondary | ICD-10-CM | POA: Diagnosis not present

## 2024-02-07 DIAGNOSIS — E785 Hyperlipidemia, unspecified: Secondary | ICD-10-CM

## 2024-02-07 DIAGNOSIS — I700 Atherosclerosis of aorta: Secondary | ICD-10-CM

## 2024-02-07 DIAGNOSIS — N1831 Chronic kidney disease, stage 3a: Secondary | ICD-10-CM

## 2024-02-07 NOTE — Patient Instructions (Addendum)
 Medication Instructions:  Your physician recommends that you continue on your current medications as directed. Please refer to the Current Medication list given to you today.  *If you need a refill on your cardiac medications before your next appointment, please call your pharmacy*  Lab Work: This week at [facility]: [employer] may go to any of these [facility] locations:   [facility] - [id number] ([facility]) - [address] [phone]  If you have labs (blood work) drawn today and your tests are completely normal, you will receive your results only by: [employer] (if you have MyChart) OR A paper copy in the mail If you have any lab test that is abnormal or we need to change your treatment, we will call you to review the results.  Testing/Procedures: None ordered today.  Follow-Up: At [facility], you and your health needs are our priority.  As part of our continuing mission to provide you with exceptional heart care, our providers are all part of one team.  This team includes your primary Cardiologist (physician) and Advanced Practice Providers or APPs (Physician Assistants and Nurse Practitioners) who all work together to provide you with the care you need, when you need it.  Your next appointment:   Will be determined, the structural heart team will follow-up with you.  Provider:   Alverda Skeans, MD  We recommend signing up for the patient portal called "MyChart".  Sign up information is provided on this After Visit Summary.  MyChart is used to connect with patients for Virtual Visits (Telemedicine).  Patients are able to view lab/test results, encounter notes, upcoming appointments, etc.  Non-urgent messages can be sent to your provider as well.   To learn more about what you can do with MyChart, go to [email or web address].   Other Instructions      1st Floor: - Lobby - Registration  - Pharmacy   - Lab - Cafe  2nd Floor: - PV Lab - Diagnostic Testing (echo, CT, nuclear med)  3rd Floor: - Vacant  4th Floor: - TCTS (cardiothoracic surgery) - AFib Clinic - Structural Heart Clinic - Vascular Surgery  - Vascular Ultrasound  5th Floor: - HeartCare Cardiology (general and EP) - Clinical Pharmacy for coumadin, hypertension, lipid, weight-loss medications, and med management appointments    Valet parking services will be available as well.

## 2024-02-07 NOTE — Progress Notes (Addendum)
 Pre Surgical Assessment: 5 M Walk Test  50M=16.65ft  5 Meter Walk Test- trial 1: 3.61 seconds 5 Meter Walk Test- trial 2: 2.98 seconds 5 Meter Walk Test- trial 3: 3.42 seconds 5 Meter Walk Test Average: 3.34 seconds  _______________________    Procedure Type: Isolated AVR Perioperative Outcome Estimate % Operative Mortality 3.8% Morbidity & Mortality 8.76% Stroke 1.18% Renal Failure 1.47% Reoperation 3.04% Prolonged Ventilation 4.97% Deep Sternal Wound Infection 0.045% Long Hospital Stay (>14 days) 4.38% Short Hospital Stay (<6 days)* 38.8%

## 2024-02-10 ENCOUNTER — Encounter: Payer: Self-pay | Admitting: Internal Medicine

## 2024-02-10 ENCOUNTER — Other Ambulatory Visit: Payer: Self-pay

## 2024-02-10 ENCOUNTER — Telehealth: Payer: Self-pay | Admitting: Internal Medicine

## 2024-02-10 DIAGNOSIS — T148XXA Other injury of unspecified body region, initial encounter: Secondary | ICD-10-CM

## 2024-02-10 LAB — BASIC METABOLIC PANEL WITH GFR
BUN/Creatinine Ratio: 25 (ref 12–28)
BUN: 32 mg/dL — ABNORMAL HIGH (ref 8–27)
CO2: 16 mmol/L — ABNORMAL LOW (ref 20–29)
Calcium: 9.8 mg/dL (ref 8.7–10.3)
Chloride: 101 mmol/L (ref 96–106)
Creatinine, Ser: 1.27 mg/dL — ABNORMAL HIGH (ref 0.57–1.00)
Glucose: 75 mg/dL (ref 70–99)
Potassium: 5.3 mmol/L — ABNORMAL HIGH (ref 3.5–5.2)
Sodium: 139 mmol/L (ref 134–144)
eGFR: 42 mL/min/{1.73_m2} — ABNORMAL LOW (ref >59)

## 2024-02-10 NOTE — Telephone Encounter (Signed)
 Pt c/o medication issue:  1. Name of Medication: furosemide (LASIX) 20 MG tablet   2. How are you currently taking this medication (dosage and times per day)? furosemide (LASIX) 20 MG tablet   3. Are you having a reaction (difficulty breathing--STAT)? No  4. What is your medication issue? Pt requesting cb to clarify how she is supposed to be taking it

## 2024-02-10 NOTE — Telephone Encounter (Signed)
 Spoke with pt over the phone and explained that after speaking with Dr. Anne Fu (DOD 4/3), we are going to order an upper extremity arterial duplex for possible hematoma formation. Gina Harrell is able to schedule this patient for tomorrow 4/4 to be seen. Explained to pt that if this seems to increase in size or pain gets worse, she needs to be evaluated at the ED. Pt verbalized understanding and had no further questions.

## 2024-02-10 NOTE — Telephone Encounter (Signed)
 Pt has also developed a knot on right forearm. Had cath on 3/24 slight discomfort

## 2024-02-10 NOTE — Progress Notes (Signed)
 Right upper extremity arterial duplex ordered for possible hematoma formation post- heart cath on 3/24 per DOD

## 2024-02-11 ENCOUNTER — Ambulatory Visit (HOSPITAL_COMMUNITY)
Admission: RE | Admit: 2024-02-11 | Discharge: 2024-02-11 | Disposition: A | Discharge status: Home / Self Care | Source: Ambulatory Visit | Attending: Cardiology | Admitting: Cardiology

## 2024-02-11 ENCOUNTER — Ambulatory Visit: Payer: Medicare Other | Admitting: Cardiovascular Disease

## 2024-02-11 DIAGNOSIS — T148XXA Other injury of unspecified body region, initial encounter: Secondary | ICD-10-CM | POA: Insufficient documentation

## 2024-02-11 DIAGNOSIS — R2231 Localized swelling, mass and lump, right upper limb: Secondary | ICD-10-CM | POA: Insufficient documentation

## 2024-02-15 ENCOUNTER — Encounter: Payer: Self-pay | Admitting: Cardiology

## 2024-02-26 ENCOUNTER — Other Ambulatory Visit (HOSPITAL_COMMUNITY): Payer: Self-pay

## 2024-03-01 ENCOUNTER — Other Ambulatory Visit: Payer: Self-pay

## 2024-03-01 DIAGNOSIS — I350 Nonrheumatic aortic (valve) stenosis: Secondary | ICD-10-CM

## 2024-03-08 ENCOUNTER — Ambulatory Visit: Payer: Medicare Other | Admitting: Cardiovascular Disease

## 2024-03-14 ENCOUNTER — Ambulatory Visit (HOSPITAL_COMMUNITY)
Admission: RE | Admit: 2024-03-14 | Discharge: 2024-03-14 | Disposition: A | Discharge status: Home / Self Care | Source: Ambulatory Visit | Attending: Cardiology | Admitting: Cardiology

## 2024-03-14 DIAGNOSIS — I350 Nonrheumatic aortic (valve) stenosis: Secondary | ICD-10-CM | POA: Diagnosis present

## 2024-03-14 MED ORDER — IOHEXOL 350 MG/ML SOLN
100.0000 mL | Freq: Once | INTRAVENOUS | Status: AC | PRN
  Administered 2024-03-14: 100 mL via INTRAVENOUS

## 2024-03-16 ENCOUNTER — Other Ambulatory Visit: Payer: Self-pay | Admitting: Physician Assistant

## 2024-03-19 NOTE — Progress Notes (Unsigned)
 Patient ID: Gina Harrell, female   DOB: Sep 27, 1941, 83 y.o.   MRN: [id number]     Cardiology Office Note    Date:  03/22/2024   ID:  Gina Harrell, DOB July 16, 1941, MRN [id number]  PCP:  Suan Elm, MD  Cardiologist:   Luana Rumple, MD   Chief Complaint  Patient presents with   Cardiac Valve Problem     History of Present Illness:  Gina Harrell is a 83 y.o. female with severe paradoxical low-flow low gradient aortic stenosis, mild carotid atherosclerosis, hypertension and hyperlipidemia who returns in follow-up.  Since her last appointment she has also undergone CT and preparation for TAVR.  She has been less physically active and has not had much in the way of dyspnea or chest pain.  Her echocardiogram performed last October was interpreted as showing mild to moderate aortic stenosis with a mean gradient of 19 mmHg, dimensionless valve index of 0.32.  The calculated aortic valve area was only 0.7 cm, but Gina Harrell is quite petite with a BSA of only 1.4 m.  The indexed aortic valve area is 0.5 cm/m.  Her stroke-volume index is only 32 mL.  She has normal left ventricular ejection fraction 65-70%.  Findings were therefore borderline for paradoxical low flow low gradient severe aortic stenosis.  Cardiac catheterization 01/31/2024 showed moderate nonobstructive CAD (proximal LAD 50%, proximal RCA 50%) and hemodynamic measurements confirm the impression of low-flow low gradient aortic stenosis.  The cardiac index was 2.1 L/min meter squared, the mean transaortic valve gradient was 26 mmHg and the calculated valve area 0.7 cm (indexed for body surface area 0.5 cm/m).  The pulmonary artery wedge pressure was 13 mmHg.  Cardiac CT showed aortic annulus measurements compatible with a 23 mm/lower end of 26 mm Edwards SAPIEN TAVR stent valve.  The sinuses were fairly small and would not probably allow a 29 mm Medtronic supra annular valve.  The aorta was normal in caliber and the  iliofemoral circulation appeared to allow TAVR.  She was hospitalized 08/18/2023 with pyelonephritis.  Her most recent carotid Doppler shows stable findings with 40-59% left carotid stenosis.  After cardiac catheterization she did have a hematoma of the right radial artery, but ultrasound did not show any evidence of pseudoaneurysm.   Past Medical History:  Diagnosis Date   Cancer [facility])    Bladder cancer   Elevated cholesterol    GERD (gastroesophageal reflux disease)    Heart murmur    Heart murmur    Hypertension    Hypothyroidism    Osteoporosis    Uterine prolapse     Past Surgical History:  Procedure Laterality Date   BLADDER TUMOR EXCISION     BREAST EXCISIONAL BIOPSY Right    CAROTID DOPPLER  2013   COLONOSCOPY WITH PROPOFOL  N/A 02/17/2016   Procedure: COLONOSCOPY WITH PROPOFOL ;  Surgeon: Garrett Kallman, MD;  Location: [facility];  Service: Endoscopy;  Laterality: N/A;   CORONARY PRESSURE/FFR WITH 3D MAPPING N/A 01/31/2024   Procedure: Coronary Pressure/FFR w/3D Mapping;  Surgeon: Kyra Phy, MD;  [facility];  Service: Cardiovascular;  Laterality: N/A;   DOPPLER ECHOCARDIOGRAPHY  2013   ESOPHAGOGASTRODUODENOSCOPY (EGD) WITH PROPOFOL  N/A 02/17/2016   Procedure: ESOPHAGOGASTRODUODENOSCOPY (EGD) WITH PROPOFOL ;  Surgeon: Garrett Kallman, MD;  Location: [facility];  Service: Endoscopy;  Laterality: N/A;   RIGHT/LEFT HEART CATH AND CORONARY ANGIOGRAPHY N/A 01/31/2024   Procedure: RIGHT/LEFT HEART CATH AND CORONARY ANGIOGRAPHY;  Surgeon: Kyra Phy, MD;  [facility];  Service: Cardiovascular;  Laterality: N/A;   VAGINAL HYSTERECTOMY      Current Medications: Outpatient Medications Prior to Visit  Medication Sig Dispense Refill   ALPRAZolam  (XANAX ) 0.5 MG tablet Take 0.5 mg by mouth at bedtime.      Cholecalciferol 50 MCG (2000 UT) CAPS Take 2,000 Units by mouth daily.     diphenhydramine-acetaminophen  (TYLENOL  PM) 25-500 MG  TABS Take 2 tablets by mouth at bedtime. Reported on 12/26/2015     escitalopram  (LEXAPRO ) 10 MG tablet Take 5 mg by mouth at bedtime.     furosemide  (LASIX ) 20 MG tablet Take 1 tablet (20 mg total) by mouth 2 (two) times daily. 60 tablet 11   levothyroxine  (SYNTHROID , LEVOTHROID) 50 MCG tablet Take 50 mcg by mouth daily before breakfast.     losartan  (COZAAR ) 25 MG tablet Take 1 tablet (25 mg total) by mouth daily. 30 tablet 3   rosuvastatin  (CRESTOR ) 20 MG tablet Take 20 mg by mouth daily.     albuterol  (VENTOLIN  HFA) 108 (90 Base) MCG/ACT inhaler Inhale 1 puff into the lungs every 4 (four) hours as needed for shortness of breath. (Patient not taking: Reported on 03/20/2024)     amoxicillin (AMOXIL) 500 MG capsule Take 2,000 mg by mouth See admin instructions. Take 1 hour prior to dental visit (Patient not taking: Reported on 03/20/2024)     No facility-administered medications prior to visit.     Allergies:   Patient has no known allergies.   Family History:  The patient's  family history includes Breast cancer in her maternal aunt; Cancer in her mother; Diabetes in her maternal grandmother; Heart disease in her father; Hypertension in her mother.   ROS:   Please see the history of present illness.    All other systems are reviewed and are negative.   PHYSICAL EXAM:   VS:  BP 138/84 (BP Location: Left Arm, Patient Position: Sitting)   Pulse 63   Ht 4' 11.5" (1.511 m)   Wt 49.3 kg   BMI 21.57 kg/m      General: Alert, oriented x3, no distress, lean and very slim body build. Head: no evidence of trauma, PERRL, EOMI, no exophtalmos or lid lag, no myxedema, no xanthelasma; normal ears, nose and oropharynx Neck: normal jugular venous pulsations and no hepatojugular reflux; brisk carotid pulses without delay and no carotid bruits Chest: clear to auscultation, no signs of consolidation by percussion or palpation, normal fremitus, symmetrical and full respiratory excursions Cardiovascular:  normal position and quality of the apical impulse, regular rhythm, normal first and second heart sounds, 3/6 mid peaking aortic ejection murmur, no diastolic murmurs, rubs or gallops Abdomen: no tenderness or distention, no masses by palpation, no abnormal pulsatility or arterial bruits, normal bowel sounds, no hepatosplenomegaly Extremities: no clubbing, cyanosis or edema; 2+ radial, ulnar and brachial pulses bilaterally; 2+ right femoral, posterior tibial and dorsalis pedis pulses; 2+ left femoral, posterior tibial and dorsalis pedis pulses; no subclavian or femoral bruits Neurological: grossly nonfocal Psych: Normal mood and affect    Wt Readings from Last 3 Encounters:  03/20/24 49.3 kg  02/07/24 46.7 kg  01/31/24 48.5 kg    Studies/Labs Reviewed:   ECHO 08/18/2023:   1. Left ventricular ejection fraction, by estimation, is 65 to 70%. The  left ventricle has normal function. The left ventricle has no regional  wall motion abnormalities. There is moderate asymmetric left ventricular  hypertrophy of the septal segment.  Left ventricular  diastolic parameters are consistent with Grade I  diastolic dysfunction (impaired relaxation). The average left ventricular  global longitudinal strain is -21.9 %. The global longitudinal strain is  normal.   2. Right ventricular systolic function is normal. The right ventricular  size is normal. Tricuspid regurgitation signal is inadequate for assessing  PA pressure.   3. The mitral valve is grossly normal. Trivial mitral valve  regurgitation. No evidence of mitral stenosis.   4. The aortic valve is abnormal. There is moderate calcification of the  aortic valve. Aortic valve regurgitation is mild. Mild to moderate aortic  valve stenosis. Aortic valve area, by VTI measures 0.73 cm. Aortic valve  mean gradient measures 19.0 mmHg.   Aortic valve Vmax measures 2.86 m/s.   5. The inferior vena cava is normal in size with greater than 50%   respiratory variability, suggesting right atrial pressure of 3 mmHg.   6. Cannot exclude a small PFO.    AV Area (VTI):     0.73 cm  AV Vmax:           286.00 cm/s  AV Peak Grad:      32.7 mmHg  AV Mean Grad:      19.0 mmHg  LVOT/AV VTI ratio: 0.32  AI PHT:            559 msec     Carotid Doppler 07/26/2023: Right Carotid: Velocities in the right ICA are consistent with a 1-39%  stenosis.   Left Carotid: Velocities in the left ICA are consistent with a 40-59%  stenosis. Non-hemodynamically significant plaque <50% noted in the  CCA. LICA  stenosis based on peak systolic velocities.   Vertebrals:  Bilateral vertebral arteries demonstrate antegrade flow.  Subclavians: Normal flow hemodynamics were seen in bilateral subclavian  arteries.    EKG:   EKG Interpretation Date/Time:  Monday Mar 20 2024 08:17:32 EDT Ventricular Rate:  63 PR Interval:  148 QRS Duration:  82 QT Interval:  418 QTC Calculation: 427 R Axis:   51  Text Interpretation: Normal sinus rhythm Normal ECG When compared with ECG of 31-Jan-2024 07:36, Nonspecific T wave abnormality now evident in Lateral leads Confirmed by Rochella Benner ([id number]) on 03/20/2024 8:25:48 AM          Latest Ref Rng & Units 02/09/2024    2:49 PM 01/31/2024    9:32 AM 01/31/2024    9:30 AM  BMP  Glucose 70 - 99 mg/dL 75     BUN 8 - 27 mg/dL 32     Creatinine 4.09 - 1.00 mg/dL 8.11     BUN/Creat Ratio 12 - 28 25     Sodium 134 - 144 mmol/L 139  130  138   Potassium 3.5 - 5.2 mmol/L 5.3  3.1  3.8   Chloride 96 - 106 mmol/L 101     CO2 20 - 29 mmol/L 16     Calcium  8.7 - 10.3 mg/dL 9.8       CBC:    Component Value Date/Time   WBC 8.7 01/03/2024 1128   WBC 6.3 08/20/2023 0320   HGB 12.2 01/31/2024 0932   HGB 15.2 01/03/2024 1128   HCT 36.0 01/31/2024 0932   HCT 45.3 01/03/2024 1128   PLT 152 01/03/2024 1128   MCV 90 01/03/2024 1128   NEUTROABS 13.3 (H) 08/18/2023 1842   LYMPHSABS 1.1 08/18/2023 1842   MONOABS 1.3 (H)  08/18/2023 1842   EOSABS 0.0 08/18/2023 1842   BASOSABS 0.1 08/18/2023 1842  Lipid Panel  No results found for: "CHOL", "TRIG", "HDL", "CHOLHDL", "VLDL", "LDLCALC", "LDLDIRECT", "LABVLDL"   Recent Labs: February 28, 2020  creatinine 1.2,  hemoglobin 15.3 normal liver function tests cholesterol 148, triglycerides 56, LDL 78, HDL 59.  03/18/2022 Cholesterol 113, HDL 44, LDL 49, triglycerides 101 Hemoglobin 13.4, creatinine 1.2, potassium 3.8, ALT 22, TSH 1.29  04/01/2023 Cholesterol 159, HDL 63, LDL 76, triglycerides 111  ASSESSMENT:    1. Nonrheumatic aortic valve stenosis   2. Carotid stenosis, asymptomatic, left   3. Essential hypertension   4. Hypercholesteremia   5. Aortic atherosclerosis (HCC)   6. Smoker         PLAN:  In order of problems listed above:  AS: Symptoms remain mild.  Both echo and cardiac catheterization are consistent with paradoxical low-flow low gradient aortic stenosis and she is a woman with small body habitus which would fit the clinical tableau.  Tentatively scheduled for TAVR next month.  Still has to meet with the cardiovascular surgeon.  Carotid stenosis: Carotid Doppler findings have been very stable over the last 4 years.  No neurological complaints. HTN: Adequate control.  Continue same medications. HLP: Continue statin, target LDL less than 70 (close to that last year; to have repeat labs with Dr. Delorise Few soon). Aortic atherosclerosis: Imaging studies show evidence of extensive atherosclerotic plaque in the aorta, iliac arteries and abdominal visceral arteries but she has had no symptoms related to this or any intermittent claudication.   Raynaud's syndrome: She has not had any symptoms of this in years. Smoking:  once again strongly recommend complete and permanent smoking cessation   Medication Adjustments/Labs and Tests Ordered: Current medicines are reviewed at length with the patient today.  Concerns regarding medicines are outlined  above.  Medication changes, Labs and Tests ordered today are listed in the Patient Instructions below. Patient Instructions  Medication Instructions:  No changes *If you need a refill on your cardiac medications before your next appointment, please call your pharmacy*  Follow-Up: At [facility], you and your health needs are our priority.  As part of our continuing mission to provide you with exceptional heart care, our providers are all part of one team.  This team includes your primary Cardiologist (physician) and Advanced Practice Providers or APPs (Physician Assistants and Nurse Practitioners) who all work together to provide you with the care you need, when you need it.  Your next appointment:   4 month(s)  Provider:   Luana Rumple, MD    We recommend signing up for the patient portal called "MyChart".  Sign up information is provided on this After Visit Summary.  MyChart is used to connect with patients for Virtual Visits (Telemedicine).  Patients are able to view lab/test results, encounter notes, upcoming appointments, etc.  Non-urgent messages can be sent to your provider as well.   To learn more about what you can do with MyChart, go to [email or web address].         Signed, Luana Rumple, MD  03/22/2024 3:55 PM    [facility] [address] [place], [place], [place]  [address] Phone: [phone]; Fax: [phone]

## 2024-03-20 ENCOUNTER — Ambulatory Visit: Attending: Cardiovascular Disease | Admitting: Cardiovascular Disease

## 2024-03-20 ENCOUNTER — Encounter: Payer: Self-pay | Admitting: Cardiovascular Disease

## 2024-03-20 VITALS — BP 138/84 | HR 63 | Ht 59.5 in | Wt 108.6 lb

## 2024-03-20 DIAGNOSIS — E7800 Pure hypercholesterolemia, unspecified: Secondary | ICD-10-CM

## 2024-03-20 DIAGNOSIS — I350 Nonrheumatic aortic (valve) stenosis: Secondary | ICD-10-CM | POA: Diagnosis not present

## 2024-03-20 DIAGNOSIS — I700 Atherosclerosis of aorta: Secondary | ICD-10-CM

## 2024-03-20 DIAGNOSIS — I6522 Occlusion and stenosis of left carotid artery: Secondary | ICD-10-CM

## 2024-03-20 DIAGNOSIS — I10 Essential (primary) hypertension: Secondary | ICD-10-CM

## 2024-03-20 DIAGNOSIS — F17200 Nicotine dependence, unspecified, uncomplicated: Secondary | ICD-10-CM

## 2024-03-20 NOTE — Patient Instructions (Signed)
 Medication Instructions:  No changes *If you need a refill on your cardiac medications before your next appointment, please call your pharmacy*  Follow-Up: At [facility], you and your health needs are our priority.  As part of our continuing mission to provide you with exceptional heart care, our providers are all part of one team.  This team includes your primary Cardiologist (physician) and Advanced Practice Providers or APPs (Physician Assistants and Nurse Practitioners) who all work together to provide you with the care you need, when you need it.  Your next appointment:   4 month(s)  Provider:   Luana Rumple, MD    We recommend signing up for the patient portal called "MyChart".  Sign up information is provided on this After Visit Summary.  MyChart is used to connect with patients for Virtual Visits (Telemedicine).  Patients are able to view lab/test results, encounter notes, upcoming appointments, etc.  Non-urgent messages can be sent to your provider as well.   To learn more about what you can do with MyChart, go to [email or web address].

## 2024-03-30 NOTE — Progress Notes (Signed)
 [address]       [place] [address]             [phone]        Gina Harrell [facility] Medical Record #[id number] Date of Birth: July 26, 1941  Referring: Luana Rumple, MD Primary Care: Suan Elm, MD Primary Cardiologist:Mihai Croitoru, MD  Chief Complaint:    Chief Complaint  Patient presents with   Aortic Stenosis    TAVR consult, review all studies    History of Present Illness:     Gina Harrell is a 83 y.o. female presents for surgical evaluation of severe aortic stenosis.  She has severe paradoxical low-flow low gradient aortic stenosis, hypertension and hyperlipidemia.  She continues to have some exertional shortness of breath.  She occasionally has some chest pain.   Past Medical History:  Diagnosis Date   Cancer [facility])    Bladder cancer   Elevated cholesterol    GERD (gastroesophageal reflux disease)    Heart murmur    Heart murmur    Hypertension    Hypothyroidism    Osteoporosis    Uterine prolapse     Past Surgical History:  Procedure Laterality Date   BLADDER TUMOR EXCISION     BREAST EXCISIONAL BIOPSY Right    CAROTID DOPPLER  2013   COLONOSCOPY WITH PROPOFOL  N/A 02/17/2016   Procedure: COLONOSCOPY WITH PROPOFOL ;  Surgeon: Garrett Kallman, MD;  Location: [facility];  Service: Endoscopy;  Laterality: N/A;   CORONARY PRESSURE/FFR WITH 3D MAPPING N/A 01/31/2024   Procedure: Coronary Pressure/FFR w/3D Mapping;  Surgeon: Kyra Phy, MD;  [facility];  Service: Cardiovascular;  Laterality: N/A;   DOPPLER ECHOCARDIOGRAPHY  2013   ESOPHAGOGASTRODUODENOSCOPY (EGD) WITH PROPOFOL  N/A 02/17/2016   Procedure: ESOPHAGOGASTRODUODENOSCOPY (EGD) WITH PROPOFOL ;  Surgeon: Garrett Kallman, MD;  Location: [facility];  Service: Endoscopy;  Laterality: N/A;   RIGHT/LEFT HEART CATH AND CORONARY ANGIOGRAPHY N/A 01/31/2024   Procedure: RIGHT/LEFT HEART CATH AND CORONARY ANGIOGRAPHY;  Surgeon: Kyra Phy, MD;  [facility];  Service: Cardiovascular;  Laterality: N/A;   VAGINAL HYSTERECTOMY      Social History:  Social History   Tobacco Use  Smoking Status Every Day   Current packs/day: 0.50   Average packs/day: 0.5 packs/day for 50.0 years (25.0 ttl pk-yrs)   Types: Cigarettes  Smokeless Tobacco Never  Tobacco Comments   03/20/2024 Patient smokes 8 cigarettes daily   01/03/2024 Patient states one pack last about three days    Social History   Substance and Sexual Activity  Alcohol Use Not Currently     No Known Allergies    Current Outpatient Medications  Medication Sig Dispense Refill   albuterol  (VENTOLIN  HFA) 108 (90 Base) MCG/ACT inhaler Inhale 1 puff into the lungs every 4 (four) hours as needed for shortness of breath.     ALPRAZolam  (XANAX ) 0.5 MG tablet Take 0.5 mg by mouth at bedtime.      amoxicillin (AMOXIL) 500 MG capsule Take 2,000 mg by mouth See admin instructions. Take 1 hour prior to dental visit     Cholecalciferol 50 MCG (2000 UT) CAPS Take 2,000 Units by mouth daily.     diphenhydramine-acetaminophen  (TYLENOL  PM) 25-500 MG TABS Take 2 tablets by mouth at bedtime. Reported on 12/26/2015     escitalopram  (LEXAPRO ) 10 MG tablet Take 5 mg by mouth at bedtime.     furosemide  (LASIX ) 20 MG tablet  Take 1 tablet (20 mg total) by mouth 2 (two) times daily. 60 tablet 11   levothyroxine  (SYNTHROID , LEVOTHROID) 50 MCG tablet Take 50 mcg by mouth daily before breakfast.     losartan  (COZAAR ) 25 MG tablet Take 1 tablet (25 mg total) by mouth daily. 30 tablet 3   rosuvastatin  (CRESTOR ) 20 MG tablet Take 20 mg by mouth daily.     No current facility-administered medications for this visit.    (Not in a hospital admission)   Family History  Problem Relation Age of Onset   Cancer Mother        Colon cancer   Hypertension Mother    Heart disease Father    Breast cancer Maternal Aunt        Age 70's   Diabetes Maternal Grandmother       Review of Systems:   Review of Systems  Constitutional:  Positive for malaise/fatigue.  Respiratory:  Positive for shortness of breath.   Cardiovascular:  Positive for chest pain.  Neurological: Negative.       Physical Exam: BP (!) 153/85 (BP Location: Left Arm, Patient Position: Sitting, Cuff Size: Normal)   Pulse 72   Resp 20   Ht 4' 11.5" (1.511 m)   Wt 108 lb (49 kg)   SpO2 98% Comment: RA  BMI 21.45 kg/m  Physical Exam Constitutional:      General: She is not in acute distress.    Appearance: She is not ill-appearing.  HENT:     Head: Normocephalic and atraumatic.  Eyes:     Extraocular Movements: Extraocular movements intact.  Cardiovascular:     Rate and Rhythm: Normal rate.  Pulmonary:     Effort: Pulmonary effort is normal. No respiratory distress.  Abdominal:     General: Abdomen is flat. There is no distension.  Musculoskeletal:        General: Normal range of motion.     Cervical back: Normal range of motion.  Skin:    General: Skin is warm and dry.  Neurological:     General: No focal deficit present.     Mental Status: She is alert and oriented to person, place, and time.       Diagnostic Studies & Laboratory data:    Left Heart Catherization:   Intervention Echo: IMPRESSIONS     1. Left ventricular ejection fraction, by estimation, is 65 to 70%. The  left ventricle has normal function. The left ventricle has no regional  wall motion abnormalities. There is moderate asymmetric left ventricular  hypertrophy of the septal segment.  Left ventricular diastolic parameters are consistent with Grade I  diastolic dysfunction (impaired relaxation). The average left ventricular  global longitudinal strain is -21.9 %. The global longitudinal strain is  normal.   2. Right ventricular systolic function is normal. The right ventricular  size is normal. Tricuspid regurgitation signal is inadequate for assessing  PA pressure.   3. The mitral  valve is grossly normal. Trivial mitral valve  regurgitation. No evidence of mitral stenosis.   4. The aortic valve is abnormal. There is moderate calcification of the  aortic valve. Aortic valve regurgitation is mild. Mild to moderate aortic  valve stenosis. Aortic valve area, by VTI measures 0.73 cm. Aortic valve  mean gradient measures 19.0 mmHg.   Aortic valve Vmax measures 2.86 m/s.   5. The inferior vena cava is normal in size with greater than 50%  respiratory variability, suggesting right atrial pressure of 3 mmHg.  6. Cannot exclude a small PFO.     EKG: Sinus  CT Scan: RIGHT: Proximal common iliac artery:9 mm Mid common iliac artery:7 mm Distal common iliac artery:7 mm Proximal external iliac artery:6 mm Mid external iliac artery:6 mm Distal external iliac artery:5 mm Common femoral artery:6 mm   LEFT: Proximal common iliac artery:10 mm Mid common iliac artery:9 mm Distal common iliac artery:8 mm Proximal external iliac artery:7 mm Mid external iliac artery:6 mm Distal external iliac artery:6 mm Common femoral artery:6 mm  Motion corrected 25% measurements as follows   Diameter 24.6 mm x 22.5 mm average diameter annulus 23.4 mm   Perimeter 73.9 mm   Area 429 mm 2   This would suggest that a soft 26 mm Sapien minus 1 cc may be suitable for deployment I have independently reviewed the above radiologic studies and discussed with the patient   Recent Lab Findings: Lab Results  Component Value Date   WBC 8.7 01/03/2024   HGB 12.2 01/31/2024   HCT 36.0 01/31/2024   PLT 152 01/03/2024   GLUCOSE 75 02/09/2024   ALT 18 08/18/2023   AST 20 08/18/2023   NA 139 02/09/2024   K 5.3 (H) 02/09/2024   CL 101 02/09/2024   CREATININE 1.27 (H) 02/09/2024   BUN 32 (H) 02/09/2024   CO2 16 (L) 02/09/2024      Assessment / Plan:   83 y.o. female with severe aortic stenosis.  STS score: 3.8.  NYHA Class 2.  The risks and benefits of transfemoral TAVR were  discussed in detail.  She appears younger than her stated age and is in good shape.  She is very small statured plus think that a smaller valve would do well with her.  We also discussed possibility of an emergent sternotomy to address any procedural complications.  Based on our discussion, we collectively decided that an emergent sternotomy would be indicated.  The patient is agreeable to proceed.  Based on my review of her LHC, echo, and CTA, I agree with the multidisciplinary plan to proceed with a 23mm Sapien 3 TAVR via a right femoral approach.      I  spent 30 minutes counseling the patient face to face.   Gina Harrell 03/31/2024 3:02 PM

## 2024-03-30 NOTE — H&P (View-Only) (Signed)
 [address]       [place] [address]             [phone]        Gina Harrell [facility] Medical Record #[id number] Date of Birth: July 26, 1941  Referring: Luana Rumple, MD Primary Care: Suan Elm, MD Primary Cardiologist:Mihai Croitoru, MD  Chief Complaint:    Chief Complaint  Patient presents with   Aortic Stenosis    TAVR consult, review all studies    History of Present Illness:     Gina Harrell is a 83 y.o. female presents for surgical evaluation of severe aortic stenosis.  She has severe paradoxical low-flow low gradient aortic stenosis, hypertension and hyperlipidemia.  She continues to have some exertional shortness of breath.  She occasionally has some chest pain.   Past Medical History:  Diagnosis Date   Cancer [facility])    Bladder cancer   Elevated cholesterol    GERD (gastroesophageal reflux disease)    Heart murmur    Heart murmur    Hypertension    Hypothyroidism    Osteoporosis    Uterine prolapse     Past Surgical History:  Procedure Laterality Date   BLADDER TUMOR EXCISION     BREAST EXCISIONAL BIOPSY Right    CAROTID DOPPLER  2013   COLONOSCOPY WITH PROPOFOL  N/A 02/17/2016   Procedure: COLONOSCOPY WITH PROPOFOL ;  Surgeon: Garrett Kallman, MD;  Location: [facility] ENDOS[facility];  Service: Endoscopy;  Laterality: N/A;   CORONARY PRESSURE/FFR WITH 3D MAPPING N/A 01/31/2024   Procedure: Coronary Pressure/FFR w/3D Mapping;  Surgeon: Kyra Phy, MD;  [facility]: [facility] INV[facility];  Service: Cardiovascular;  Laterality: N/A;   DOPPLER ECHOCARDIOGRAPHY  2013   ESOPHAGOGASTRODUODENOSCOPY (EGD) WITH PROPOFOL  N/A 02/17/2016   Procedure: ESOPHAGOGASTRODUODENOSCOPY (EGD) WITH PROPOFOL ;  Surgeon: Garrett Kallman, MD;  Location: [facility]OS[facility];  Service: Endoscopy;  Laterality: N/A;   RIGHT/LEFT HEART CATH AND CORONARY ANGIOGRAPHY N/A 01/31/2024   Procedure: RIGHT/LEFT HEART CATH AND CORONARY ANGIOGRAPHY;  Surgeon: Kyra Phy, MD;  [facility]: [facility] INV[facility] CV LAB;  Service: Cardiovascular;  Laterality: N/A;   VAGINAL HYSTERECTOMY      Social History:  Social History   Tobacco Use  Smoking Status Every Day   Current packs/day: 0.50   Average packs/day: 0.5 packs/day for 50.0 years (25.0 ttl pk-yrs)   Types: Cigarettes  Smokeless Tobacco Never  Tobacco Comments   03/20/2024 Patient smokes 8 cigarettes daily   01/03/2024 Patient states one pack last about three days    Social History   Substance and Sexual Activity  Alcohol Use Not Currently     No Known Allergies    Current Outpatient Medications  Medication Sig Dispense Refill   albuterol  (VENTOLIN  HFA) 108 (90 Base) MCG/ACT inhaler Inhale 1 puff into the lungs every 4 (four) hours as needed for shortness of breath.     ALPRAZolam  (XANAX ) 0.5 MG tablet Take 0.5 mg by mouth at bedtime.      amoxicillin (AMOXIL) 500 MG capsule Take 2,000 mg by mouth See admin instructions. Take 1 hour prior to dental visit     Cholecalciferol 50 MCG (2000 UT) CAPS Take 2,000 Units by mouth daily.     diphenhydramine-acetaminophen  (TYLENOL  PM) 25-500 MG TABS Take 2 tablets by mouth at bedtime. Reported on 12/26/2015     escitalopram  (LEXAPRO ) 10 MG tablet Take 5 mg by mouth at bedtime.     furosemide  (LASIX ) 20 MG tablet  Take 1 tablet (20 mg total) by mouth 2 (two) times daily. 60 tablet 11   levothyroxine  (SYNTHROID , LEVOTHROID) 50 MCG tablet Take 50 mcg by mouth daily before breakfast.     losartan  (COZAAR ) 25 MG tablet Take 1 tablet (25 mg total) by mouth daily. 30 tablet 3   rosuvastatin  (CRESTOR ) 20 MG tablet Take 20 mg by mouth daily.     No current facility-administered medications for this visit.    (Not in a hospital admission)   Family History  Problem Relation Age of Onset   Cancer Mother        Colon cancer   Hypertension Mother    Heart disease Father    Breast cancer Maternal Aunt        Age 70's   Diabetes Maternal Grandmother       Review of Systems:   Review of Systems  Constitutional:  Positive for malaise/fatigue.  Respiratory:  Positive for shortness of breath.   Cardiovascular:  Positive for chest pain.  Neurological: Negative.       Physical Exam: BP (!) 153/85 (BP Location: Left Arm, Patient Position: Sitting, Cuff Size: Normal)   Pulse 72   Resp 20   Ht 4' 11.5" (1.511 m)   Wt 108 lb (49 kg)   SpO2 98% Comment: RA  BMI 21.45 kg/m  Physical Exam Constitutional:      General: She is not in acute distress.    Appearance: She is not ill-appearing.  HENT:     Head: Normocephalic and atraumatic.  Eyes:     Extraocular Movements: Extraocular movements intact.  Cardiovascular:     Rate and Rhythm: Normal rate.  Pulmonary:     Effort: Pulmonary effort is normal. No respiratory distress.  Abdominal:     General: Abdomen is flat. There is no distension.  Musculoskeletal:        General: Normal range of motion.     Cervical back: Normal range of motion.  Skin:    General: Skin is warm and dry.  Neurological:     General: No focal deficit present.     Mental Status: She is alert and oriented to person, place, and time.       Diagnostic Studies & Laboratory data:    Left Heart Catherization:   Intervention Echo: IMPRESSIONS     1. Left ventricular ejection fraction, by estimation, is 65 to 70%. The  left ventricle has normal function. The left ventricle has no regional  wall motion abnormalities. There is moderate asymmetric left ventricular  hypertrophy of the septal segment.  Left ventricular diastolic parameters are consistent with Grade I  diastolic dysfunction (impaired relaxation). The average left ventricular  global longitudinal strain is -21.9 %. The global longitudinal strain is  normal.   2. Right ventricular systolic function is normal. The right ventricular  size is normal. Tricuspid regurgitation signal is inadequate for assessing  PA pressure.   3. The mitral  valve is grossly normal. Trivial mitral valve  regurgitation. No evidence of mitral stenosis.   4. The aortic valve is abnormal. There is moderate calcification of the  aortic valve. Aortic valve regurgitation is mild. Mild to moderate aortic  valve stenosis. Aortic valve area, by VTI measures 0.73 cm. Aortic valve  mean gradient measures 19.0 mmHg.   Aortic valve Vmax measures 2.86 m/s.   5. The inferior vena cava is normal in size with greater than 50%  respiratory variability, suggesting right atrial pressure of 3 mmHg.  6. Cannot exclude a small PFO.     EKG: Sinus  CT Scan: RIGHT: Proximal common iliac artery:9 mm Mid common iliac artery:7 mm Distal common iliac artery:7 mm Proximal external iliac artery:6 mm Mid external iliac artery:6 mm Distal external iliac artery:5 mm Common femoral artery:6 mm   LEFT: Proximal common iliac artery:10 mm Mid common iliac artery:9 mm Distal common iliac artery:8 mm Proximal external iliac artery:7 mm Mid external iliac artery:6 mm Distal external iliac artery:6 mm Common femoral artery:6 mm  Motion corrected 25% measurements as follows   Diameter 24.6 mm x 22.5 mm average diameter annulus 23.4 mm   Perimeter 73.9 mm   Area 429 mm 2   This would suggest that a soft 26 mm Sapien minus 1 cc may be suitable for deployment I have independently reviewed the above radiologic studies and discussed with the patient   Recent Lab Findings: Lab Results  Component Value Date   WBC 8.7 01/03/2024   HGB 12.2 01/31/2024   HCT 36.0 01/31/2024   PLT 152 01/03/2024   GLUCOSE 75 02/09/2024   ALT 18 08/18/2023   AST 20 08/18/2023   NA 139 02/09/2024   K 5.3 (H) 02/09/2024   CL 101 02/09/2024   CREATININE 1.27 (H) 02/09/2024   BUN 32 (H) 02/09/2024   CO2 16 (L) 02/09/2024      Assessment / Plan:   83 y.o. female with severe aortic stenosis.  STS score: 3.8.  NYHA Class 2.  The risks and benefits of transfemoral TAVR were  discussed in detail.  She appears younger than her stated age and is in good shape.  She is very small statured plus think that a smaller valve would do well with her.  We also discussed possibility of an emergent sternotomy to address any procedural complications.  Based on our discussion, we collectively decided that an emergent sternotomy would be indicated.  The patient is agreeable to proceed.  Based on my review of her LHC, echo, and CTA, I agree with the multidisciplinary plan to proceed with a 23mm Sapien 3 TAVR via a right femoral approach.      I  spent 30 minutes counseling the patient face to face.   Hilarie Lovely 03/31/2024 3:02 PM

## 2024-03-31 ENCOUNTER — Ambulatory Visit
Attending: Thoracic Surgery (Cardiothoracic Vascular Surgery) | Admitting: Thoracic Surgery (Cardiothoracic Vascular Surgery)

## 2024-03-31 ENCOUNTER — Encounter: Payer: Self-pay | Admitting: Thoracic Surgery (Cardiothoracic Vascular Surgery)

## 2024-03-31 VITALS — BP 153/85 | HR 72 | Resp 20 | Ht 59.5 in | Wt 108.0 lb

## 2024-03-31 DIAGNOSIS — I350 Nonrheumatic aortic (valve) stenosis: Secondary | ICD-10-CM

## 2024-04-10 ENCOUNTER — Other Ambulatory Visit: Payer: Self-pay

## 2024-04-10 DIAGNOSIS — I350 Nonrheumatic aortic (valve) stenosis: Secondary | ICD-10-CM

## 2024-04-14 ENCOUNTER — Ambulatory Visit (HOSPITAL_COMMUNITY)
Admission: RE | Admit: 2024-04-14 | Discharge: 2024-04-14 | Disposition: A | Discharge status: Home / Self Care | Source: Ambulatory Visit | Attending: Internal Medicine | Admitting: Internal Medicine

## 2024-04-14 ENCOUNTER — Other Ambulatory Visit: Payer: Self-pay

## 2024-04-14 ENCOUNTER — Encounter (HOSPITAL_COMMUNITY)
Admission: RE | Admit: 2024-04-14 | Discharge: 2024-04-14 | Disposition: A | Discharge status: Home / Self Care | Source: Ambulatory Visit | Attending: Internal Medicine | Admitting: Internal Medicine

## 2024-04-14 DIAGNOSIS — I350 Nonrheumatic aortic (valve) stenosis: Secondary | ICD-10-CM | POA: Diagnosis not present

## 2024-04-14 DIAGNOSIS — Z01812 Encounter for preprocedural laboratory examination: Secondary | ICD-10-CM | POA: Insufficient documentation

## 2024-04-14 DIAGNOSIS — Z01818 Encounter for other preprocedural examination: Secondary | ICD-10-CM

## 2024-04-14 LAB — PROTIME-INR
INR: 1 (ref 0.8–1.2)
Prothrombin Time: 13.5 s (ref 11.4–15.2)

## 2024-04-14 LAB — CBC
HCT: 46 % (ref 36.0–46.0)
Hemoglobin: 14.8 g/dL (ref 12.0–15.0)
MCH: 29.5 pg (ref 26.0–34.0)
MCHC: 32.2 g/dL (ref 30.0–36.0)
MCV: 91.8 fL (ref 80.0–100.0)
Platelets: 183 10*3/uL (ref 150–400)
RBC: 5.01 MIL/uL (ref 3.87–5.11)
RDW: 12.4 % (ref 11.5–15.5)
WBC: 7.4 10*3/uL (ref 4.0–10.5)
nRBC: 0 % (ref 0.0–0.2)

## 2024-04-14 LAB — URINALYSIS, ROUTINE W REFLEX MICROSCOPIC
Bilirubin Urine: NEGATIVE
Glucose, UA: NEGATIVE mg/dL
Hgb urine dipstick: NEGATIVE
Ketones, ur: NEGATIVE mg/dL
Leukocytes,Ua: NEGATIVE
Nitrite: NEGATIVE
Protein, ur: NEGATIVE mg/dL
Specific Gravity, Urine: 1.003 — ABNORMAL LOW (ref 1.005–1.030)
pH: 5 (ref 5.0–8.0)

## 2024-04-14 LAB — COMPREHENSIVE METABOLIC PANEL WITH GFR
ALT: 16 U/L (ref 0–44)
AST: 22 U/L (ref 15–41)
Albumin: 4.5 g/dL (ref 3.5–5.0)
Alkaline Phosphatase: 72 U/L (ref 38–126)
Anion gap: 12 (ref 5–15)
BUN: 22 mg/dL (ref 8–23)
CO2: 23 mmol/L (ref 22–32)
Calcium: 9.8 mg/dL (ref 8.9–10.3)
Chloride: 101 mmol/L (ref 98–111)
Creatinine, Ser: 1.12 mg/dL — ABNORMAL HIGH (ref 0.44–1.00)
GFR, Estimated: 49 mL/min — ABNORMAL LOW (ref >60)
Glucose, Bld: 102 mg/dL — ABNORMAL HIGH (ref 70–99)
Potassium: 3.9 mmol/L (ref 3.5–5.1)
Sodium: 136 mmol/L (ref 135–145)
Total Bilirubin: 1.3 mg/dL — ABNORMAL HIGH (ref 0.0–1.2)
Total Protein: 7.5 g/dL (ref 6.5–8.1)

## 2024-04-14 LAB — TYPE AND SCREEN
ABO/RH(D): AB POS
Antibody Screen: NEGATIVE

## 2024-04-14 LAB — SURGICAL PCR SCREEN
MRSA, PCR: NEGATIVE
Staphylococcus aureus: POSITIVE — AB

## 2024-04-14 NOTE — Progress Notes (Signed)
 Patient signed all consents at PAT lab appointment. CHG soap and instructions were given to patient. CHG surgical prep reviewed with patient and all questions answered.  Patients chart send to anesthesia for review. Pt denies any respiratory illness/infection in the last two months.

## 2024-04-17 MED ORDER — DEXMEDETOMIDINE HCL IN NACL 400 MCG/100ML IV SOLN
0.1000 ug/kg/h | INTRAVENOUS | Status: AC
  Administered 2024-04-18: 24.52 ug via INTRAVENOUS
  Administered 2024-04-18: 1 ug/kg/h via INTRAVENOUS
  Filled 2024-04-17: qty 100

## 2024-04-17 MED ORDER — MAGNESIUM SULFATE 50 % IJ SOLN
40.0000 meq | INTRAMUSCULAR | Status: DC
  Filled 2024-04-17: qty 9.85

## 2024-04-17 MED ORDER — NOREPINEPHRINE 4 MG/250ML-% IV SOLN
0.0000 ug/min | INTRAVENOUS | Status: AC
  Administered 2024-04-18: 2 ug/min via INTRAVENOUS
  Filled 2024-04-17: qty 250

## 2024-04-17 MED ORDER — CEFAZOLIN SODIUM-DEXTROSE 2-4 GM/100ML-% IV SOLN
2.0000 g | INTRAVENOUS | Status: AC
  Administered 2024-04-18: 2 g via INTRAVENOUS
  Filled 2024-04-17: qty 100

## 2024-04-17 MED ORDER — HEPARIN 30,000 UNITS/1000 ML (OHS) CELLSAVER SOLUTION
Status: DC
  Filled 2024-04-17 (×2): qty 1000

## 2024-04-17 MED ORDER — POTASSIUM CHLORIDE 2 MEQ/ML IV SOLN
80.0000 meq | INTRAVENOUS | Status: DC
  Filled 2024-04-17 (×2): qty 40

## 2024-04-18 ENCOUNTER — Encounter (HOSPITAL_COMMUNITY): Payer: Self-pay | Admitting: Internal Medicine

## 2024-04-18 ENCOUNTER — Inpatient Hospital Stay (HOSPITAL_COMMUNITY): Admitting: Physician Assistant

## 2024-04-18 ENCOUNTER — Encounter (HOSPITAL_COMMUNITY)
Admission: RE | Disposition: A | Discharge status: Home / Self Care | Payer: Self-pay | Source: Home / Self Care | Attending: Internal Medicine

## 2024-04-18 ENCOUNTER — Inpatient Hospital Stay (HOSPITAL_COMMUNITY): Admitting: Certified Registered Nurse Anesthetist

## 2024-04-18 ENCOUNTER — Inpatient Hospital Stay (HOSPITAL_COMMUNITY)
Admission: RE | Admit: 2024-04-18 | Discharge: 2024-04-19 | DRG: 267 | Disposition: A | Discharge status: Home / Self Care | Attending: Internal Medicine | Admitting: Internal Medicine

## 2024-04-18 ENCOUNTER — Inpatient Hospital Stay (HOSPITAL_COMMUNITY)

## 2024-04-18 ENCOUNTER — Other Ambulatory Visit: Payer: Self-pay

## 2024-04-18 DIAGNOSIS — Z006 Encounter for examination for normal comparison and control in clinical research program: Secondary | ICD-10-CM | POA: Diagnosis not present

## 2024-04-18 DIAGNOSIS — M810 Age-related osteoporosis without current pathological fracture: Secondary | ICD-10-CM | POA: Diagnosis present

## 2024-04-18 DIAGNOSIS — F17210 Nicotine dependence, cigarettes, uncomplicated: Secondary | ICD-10-CM | POA: Diagnosis present

## 2024-04-18 DIAGNOSIS — Z87440 Personal history of urinary (tract) infections: Secondary | ICD-10-CM

## 2024-04-18 DIAGNOSIS — R011 Cardiac murmur, unspecified: Secondary | ICD-10-CM | POA: Diagnosis present

## 2024-04-18 DIAGNOSIS — I6523 Occlusion and stenosis of bilateral carotid arteries: Secondary | ICD-10-CM | POA: Diagnosis present

## 2024-04-18 DIAGNOSIS — I2510 Atherosclerotic heart disease of native coronary artery without angina pectoris: Secondary | ICD-10-CM | POA: Diagnosis present

## 2024-04-18 DIAGNOSIS — K219 Gastro-esophageal reflux disease without esophagitis: Secondary | ICD-10-CM | POA: Diagnosis present

## 2024-04-18 DIAGNOSIS — Z79899 Other long term (current) drug therapy: Secondary | ICD-10-CM | POA: Diagnosis not present

## 2024-04-18 DIAGNOSIS — Z8249 Family history of ischemic heart disease and other diseases of the circulatory system: Secondary | ICD-10-CM

## 2024-04-18 DIAGNOSIS — J449 Chronic obstructive pulmonary disease, unspecified: Secondary | ICD-10-CM

## 2024-04-18 DIAGNOSIS — I350 Nonrheumatic aortic (valve) stenosis: Principal | ICD-10-CM

## 2024-04-18 DIAGNOSIS — I10 Essential (primary) hypertension: Secondary | ICD-10-CM | POA: Diagnosis present

## 2024-04-18 DIAGNOSIS — Z79890 Hormone replacement therapy (postmenopausal): Secondary | ICD-10-CM

## 2024-04-18 DIAGNOSIS — I130 Hypertensive heart and chronic kidney disease with heart failure and stage 1 through stage 4 chronic kidney disease, or unspecified chronic kidney disease: Secondary | ICD-10-CM | POA: Diagnosis present

## 2024-04-18 DIAGNOSIS — E039 Hypothyroidism, unspecified: Secondary | ICD-10-CM | POA: Diagnosis present

## 2024-04-18 DIAGNOSIS — I2721 Secondary pulmonary arterial hypertension: Secondary | ICD-10-CM | POA: Diagnosis present

## 2024-04-18 DIAGNOSIS — Z833 Family history of diabetes mellitus: Secondary | ICD-10-CM | POA: Diagnosis not present

## 2024-04-18 DIAGNOSIS — N1831 Chronic kidney disease, stage 3a: Secondary | ICD-10-CM | POA: Diagnosis present

## 2024-04-18 DIAGNOSIS — E7800 Pure hypercholesterolemia, unspecified: Secondary | ICD-10-CM | POA: Diagnosis present

## 2024-04-18 DIAGNOSIS — I5032 Chronic diastolic (congestive) heart failure: Secondary | ICD-10-CM | POA: Diagnosis present

## 2024-04-18 DIAGNOSIS — Z800 Family history of malignant neoplasm of digestive organs: Secondary | ICD-10-CM | POA: Diagnosis not present

## 2024-04-18 DIAGNOSIS — Z90710 Acquired absence of both cervix and uterus: Secondary | ICD-10-CM | POA: Diagnosis not present

## 2024-04-18 DIAGNOSIS — Z952 Presence of prosthetic heart valve: Principal | ICD-10-CM

## 2024-04-18 DIAGNOSIS — Z8551 Personal history of malignant neoplasm of bladder: Secondary | ICD-10-CM

## 2024-04-18 HISTORY — DX: Malignant neoplasm of bladder, unspecified: C67.9

## 2024-04-18 HISTORY — DX: Nonrheumatic aortic (valve) stenosis: I35.0

## 2024-04-18 HISTORY — PX: INTRAOPERATIVE TRANSTHORACIC ECHOCARDIOGRAM: SHX6523

## 2024-04-18 HISTORY — DX: Chronic obstructive pulmonary disease, unspecified: J44.9

## 2024-04-18 LAB — ECHOCARDIOGRAM LIMITED
AR max vel: 1.97 cm2
AV Area VTI: 2.19 cm2
AV Area mean vel: 1.96 cm2
AV Mean grad: 4 mmHg
AV Peak grad: 8.4 mmHg
Ao pk vel: 1.45 m/s
Area-P 1/2: 3.99 cm2
P 1/2 time: 451 ms
S' Lateral: 2.9 cm

## 2024-04-18 LAB — POCT I-STAT, CHEM 8
BUN: 22 mg/dL (ref 8–23)
Calcium, Ion: 1.24 mmol/L (ref 1.15–1.40)
Chloride: 106 mmol/L (ref 98–111)
Creatinine, Ser: 0.9 mg/dL (ref 0.44–1.00)
Glucose, Bld: 93 mg/dL (ref 70–99)
HCT: 35 % — ABNORMAL LOW (ref 36.0–46.0)
Hemoglobin: 11.9 g/dL — ABNORMAL LOW (ref 12.0–15.0)
Potassium: 3.9 mmol/L (ref 3.5–5.1)
Sodium: 138 mmol/L (ref 135–145)
TCO2: 22 mmol/L (ref 22–32)

## 2024-04-18 LAB — ABO/RH: ABO/RH(D): AB POS

## 2024-04-18 LAB — POCT ACTIVATED CLOTTING TIME: Activated Clotting Time: 268 s

## 2024-04-18 MED ORDER — SODIUM CHLORIDE 0.9 % IV SOLN: INTRAVENOUS | Status: DC

## 2024-04-18 MED ORDER — LIDOCAINE HCL (PF) 1 % IJ SOLN
INTRAMUSCULAR | Status: DC | PRN
  Administered 2024-04-18 (×2): 10 mL

## 2024-04-18 MED ORDER — ONDANSETRON HCL 4 MG/2ML IJ SOLN
4.0000 mg | Freq: Four times a day (QID) | INTRAMUSCULAR | Status: DC | PRN
  Administered 2024-04-19: 4 mg via INTRAVENOUS
  Filled 2024-04-18: qty 2

## 2024-04-18 MED ORDER — CHLORHEXIDINE GLUCONATE 0.12 % MT SOLN
15.0000 mL | Freq: Once | OROMUCOSAL | Status: AC
  Administered 2024-04-18: 15 mL via OROMUCOSAL
  Filled 2024-04-18: qty 15

## 2024-04-18 MED ORDER — OXYCODONE HCL 5 MG PO TABS
5.0000 mg | ORAL_TABLET | ORAL | Status: DC | PRN
  Administered 2024-04-18: 10 mg via ORAL
  Filled 2024-04-18: qty 2

## 2024-04-18 MED ORDER — LACTATED RINGERS IV SOLN: INTRAVENOUS | Status: DC

## 2024-04-18 MED ORDER — HEPARIN (PORCINE) IN NACL 2000-0.9 UNIT/L-% IV SOLN
INTRAVENOUS | Status: DC | PRN
  Administered 2024-04-18: 1000 mL

## 2024-04-18 MED ORDER — ESCITALOPRAM OXALATE 10 MG PO TABS
5.0000 mg | ORAL_TABLET | Freq: Every day | ORAL | Status: DC
  Administered 2024-04-18: 5 mg via ORAL
  Filled 2024-04-18: qty 1

## 2024-04-18 MED ORDER — NOREPINEPHRINE 4 MG/250ML-% IV SOLN: 0.0000 ug/min | INTRAVENOUS | Status: DC

## 2024-04-18 MED ORDER — CHLORHEXIDINE GLUCONATE 4 % EX SOLN: 60.0000 mL | Freq: Once | CUTANEOUS | Status: DC

## 2024-04-18 MED ORDER — PROTAMINE SULFATE 10 MG/ML IV SOLN
INTRAVENOUS | Status: DC | PRN
  Administered 2024-04-18: 70 mg via INTRAVENOUS

## 2024-04-18 MED ORDER — ACETAMINOPHEN 650 MG RE SUPP: 650.0000 mg | Freq: Four times a day (QID) | RECTAL | Status: DC | PRN

## 2024-04-18 MED ORDER — MORPHINE SULFATE (PF) 2 MG/ML IV SOLN
1.0000 mg | INTRAVENOUS | Status: DC | PRN
  Administered 2024-04-19: 2 mg via INTRAVENOUS
  Filled 2024-04-18: qty 1

## 2024-04-18 MED ORDER — ACETAMINOPHEN 325 MG PO TABS: 650.0000 mg | ORAL_TABLET | Freq: Four times a day (QID) | ORAL | Status: DC | PRN

## 2024-04-18 MED ORDER — SODIUM CHLORIDE 0.9% FLUSH: 3.0000 mL | INTRAVENOUS | Status: DC | PRN

## 2024-04-18 MED ORDER — CEFAZOLIN SODIUM-DEXTROSE 2-4 GM/100ML-% IV SOLN
2.0000 g | Freq: Three times a day (TID) | INTRAVENOUS | Status: AC
  Administered 2024-04-18 – 2024-04-19 (×2): 2 g via INTRAVENOUS
  Filled 2024-04-18 (×2): qty 100

## 2024-04-18 MED ORDER — HEPARIN SODIUM (PORCINE) 1000 UNIT/ML IJ SOLN
INTRAMUSCULAR | Status: DC | PRN
  Administered 2024-04-18: 7000 [IU] via INTRAVENOUS

## 2024-04-18 MED ORDER — IODIXANOL 320 MG/ML IV SOLN
INTRAVENOUS | Status: DC | PRN
  Administered 2024-04-18: 70 mL

## 2024-04-18 MED ORDER — ALBUMIN HUMAN 5 % IV SOLN: 12.5000 g | Freq: Once | INTRAVENOUS | Status: AC

## 2024-04-18 MED ORDER — LIDOCAINE 2% (20 MG/ML) 5 ML SYRINGE
INTRAMUSCULAR | Status: DC | PRN
  Administered 2024-04-18: 20 mg via INTRAVENOUS

## 2024-04-18 MED ORDER — ACETAMINOPHEN 10 MG/ML IV SOLN
INTRAVENOUS | Status: DC | PRN
  Administered 2024-04-18: 1000 mg via INTRAVENOUS

## 2024-04-18 MED ORDER — SODIUM CHLORIDE 0.9% FLUSH
3.0000 mL | Freq: Two times a day (BID) | INTRAVENOUS | Status: DC
  Administered 2024-04-19: 3 mL via INTRAVENOUS

## 2024-04-18 MED ORDER — ROSUVASTATIN CALCIUM 20 MG PO TABS
20.0000 mg | ORAL_TABLET | Freq: Every day | ORAL | Status: DC
  Administered 2024-04-18 – 2024-04-19 (×2): 20 mg via ORAL
  Filled 2024-04-18 (×2): qty 1

## 2024-04-18 MED ORDER — HEPARIN (PORCINE) IN NACL 1000-0.9 UT/500ML-% IV SOLN
INTRAVENOUS | Status: DC | PRN
  Administered 2024-04-18: 500 mL

## 2024-04-18 MED ORDER — ACETAMINOPHEN 10 MG/ML IV SOLN
INTRAVENOUS | Status: AC
  Filled 2024-04-18: qty 100

## 2024-04-18 MED ORDER — PROPOFOL 500 MG/50ML IV EMUL
INTRAVENOUS | Status: DC | PRN
Start: 2024-04-18 — End: 2024-04-18
  Administered 2024-04-18: 10 ug/kg/min via INTRAVENOUS

## 2024-04-18 MED ORDER — ACETAMINOPHEN 500 MG PO TABS: 1000.0000 mg | ORAL_TABLET | Freq: Once | ORAL | Status: DC

## 2024-04-18 MED ORDER — NITROGLYCERIN IN D5W 200-5 MCG/ML-% IV SOLN: 0.0000 ug/min | INTRAVENOUS | Status: DC

## 2024-04-18 MED ORDER — ALBUMIN HUMAN 5 % IV SOLN
INTRAVENOUS | Status: AC
  Administered 2024-04-18: 12.5 g via INTRAVENOUS
  Filled 2024-04-18: qty 250

## 2024-04-18 MED ORDER — CHLORHEXIDINE GLUCONATE 4 % EX SOLN: 30.0000 mL | CUTANEOUS | Status: DC

## 2024-04-18 MED ORDER — LEVOTHYROXINE SODIUM 50 MCG PO TABS
50.0000 ug | ORAL_TABLET | Freq: Every day | ORAL | Status: DC
Start: 2024-04-19 — End: 2024-04-19
  Administered 2024-04-19: 50 ug via ORAL
  Filled 2024-04-18: qty 1

## 2024-04-18 MED ORDER — FENTANYL CITRATE (PF) 100 MCG/2ML IJ SOLN
INTRAMUSCULAR | Status: DC | PRN
  Administered 2024-04-18 (×3): 25 ug via INTRAVENOUS

## 2024-04-18 MED ORDER — SODIUM CHLORIDE 0.9 % IV SOLN: 250.0000 mL | INTRAVENOUS | Status: DC | PRN

## 2024-04-18 MED ORDER — ALPRAZOLAM 0.5 MG PO TABS
0.5000 mg | ORAL_TABLET | Freq: Every day | ORAL | Status: DC
  Administered 2024-04-18: 0.5 mg via ORAL
  Filled 2024-04-18: qty 1

## 2024-04-18 MED ORDER — SODIUM CHLORIDE 0.9 % IV SOLN: INTRAVENOUS | Status: AC

## 2024-04-18 MED ORDER — FENTANYL CITRATE (PF) 100 MCG/2ML IJ SOLN
INTRAMUSCULAR | Status: AC
Start: 2024-04-18 — End: ?
  Filled 2024-04-18: qty 2

## 2024-04-18 MED ORDER — MORPHINE SULFATE (PF) 2 MG/ML IV SOLN
INTRAVENOUS | Status: AC
  Administered 2024-04-18: 2 mg via INTRAVENOUS
  Filled 2024-04-18: qty 1

## 2024-04-18 MED ORDER — PROPOFOL 10 MG/ML IV BOLUS
INTRAVENOUS | Status: DC | PRN
  Administered 2024-04-18 (×5): 20 mg via INTRAVENOUS

## 2024-04-18 MED ORDER — ASPIRIN 81 MG PO CHEW
81.0000 mg | CHEWABLE_TABLET | Freq: Every day | ORAL | Status: DC
  Administered 2024-04-19: 81 mg via ORAL
  Filled 2024-04-18: qty 1

## 2024-04-18 MED ORDER — TRAMADOL HCL 50 MG PO TABS: 50.0000 mg | ORAL_TABLET | ORAL | Status: DC | PRN

## 2024-04-18 NOTE — H&P (Signed)
 Cardiology Admission History and Physical   Patient ID: Gina Harrell MRN: [id number]; DOB: 1941/02/05   Admission date: 04/18/2024  PCP:  Suan Elm, MD   [facility] Providers Cardiologist:  Luana Rumple, MD       Chief Complaint: Dyspnea    History of Present Illness:   The patient is an 83 year old female with a history of severe arctic stenosis, moderate obstructive coronary artery disease, pulmonary hypertension, hypertension, and hyperlipidemia who presents for elective TAVR due to ongoing lifestyle limiting dyspnea.   Past Medical History:  Diagnosis Date   Cancer [facility])    Bladder cancer   Elevated cholesterol    GERD (gastroesophageal reflux disease)    Heart murmur    Heart murmur    Hypertension    Hypothyroidism    Osteoporosis    Uterine prolapse    Past Surgical History:  Procedure Laterality Date   BLADDER TUMOR EXCISION     BREAST EXCISIONAL BIOPSY Right    CAROTID DOPPLER  2013   COLONOSCOPY WITH PROPOFOL  N/A 02/17/2016   Procedure: COLONOSCOPY WITH PROPOFOL ;  Surgeon: Garrett Kallman, MD;  [facility];  Service: Endoscopy;  Laterality: N/A;   CORONARY PRESSURE/FFR WITH 3D MAPPING N/A 01/31/2024   Procedure: Coronary Pressure/FFR w/3D Mapping;  Surgeon: Kyra Phy, MD;  [facility];  Service: Cardiovascular;  Laterality: N/A;   DOPPLER ECHOCARDIOGRAPHY  2013   ESOPHAGOGASTRODUODENOSCOPY (EGD) WITH PROPOFOL  N/A 02/17/2016   Procedure: ESOPHAGOGASTRODUODENOSCOPY (EGD) WITH PROPOFOL ;  Surgeon: Garrett Kallman, MD;  Location: [facility];  Service: Endoscopy;  Laterality: N/A;   RIGHT/LEFT HEART CATH AND CORONARY ANGIOGRAPHY N/A 01/31/2024   Procedure: RIGHT/LEFT HEART CATH AND CORONARY ANGIOGRAPHY;  Surgeon: Kyra Phy, MD;  [facility]: [facility];  Service: Cardiovascular;  Laterality: N/A;   VAGINAL HYSTERECTOMY       Medications Prior to Admission: Prior to Admission medications    Medication Sig Start Date End Date Taking? Authorizing Provider  albuterol  (VENTOLIN  HFA) 108 (90 Base) MCG/ACT inhaler Inhale 1 puff into the lungs every 4 (four) hours as needed for shortness of breath. 06/04/23  Yes [provider]  ALPRAZolam  (XANAX ) 0.5 MG tablet Take 0.5 mg by mouth at bedtime.    Yes [provider]  amoxicillin (AMOXIL) 500 MG capsule Take 2,000 mg by mouth See admin instructions. Take 1 hour prior to dental visit 12/13/23  Yes [provider]  Cholecalciferol 50 MCG (2000 UT) CAPS Take 2,000 Units by mouth daily.   Yes [provider]  CRANBERRY EXTRACT PO Take 2 capsules by mouth daily.   Yes [provider]  diphenhydramine-acetaminophen  (TYLENOL  PM) 25-500 MG TABS Take 2 tablets by mouth at bedtime. Reported on 12/26/2015   Yes [provider]  escitalopram  (LEXAPRO ) 10 MG tablet Take 5 mg by mouth at bedtime. 08/02/22  Yes [provider]  furosemide  (LASIX ) 20 MG tablet Take 1 tablet (20 mg total) by mouth 2 (two) times daily. 01/31/24 01/30/25 Yes Calloway Andrus K, MD  levothyroxine  (SYNTHROID , LEVOTHROID) 50 MCG tablet Take 50 mcg by mouth daily before breakfast.   Yes [provider]  losartan  (COZAAR ) 25 MG tablet Take 1 tablet (25 mg total) by mouth daily. 08/20/23  Yes Rai, Ripudeep K, MD  rosuvastatin  (CRESTOR ) 20 MG tablet Take 20 mg by mouth daily.   Yes [provider]     Allergies:   No Known Allergies  Social History:   Social History  Socioeconomic History   Marital status: Divorced    Spouse name: Not on file   Number of children: Not on file   Years of education: Not on file   Highest education level: Not on file  Occupational History   Not on file  Tobacco Use   Smoking status: Every Day    Current packs/day: 0.50    Average packs/day: 0.5 packs/day for 50.0 years (25.0 ttl pk-yrs)    Types: Cigarettes   Smokeless tobacco: Never   Tobacco comments:     03/20/2024 Patient smokes 8 cigarettes daily    01/03/2024 Patient states one pack last about three days  Substance and Sexual Activity   Alcohol use: Not Currently   Drug use: No   Sexual activity: Never    Birth control/protection: Surgical  Other Topics Concern   Not on file  Social History Narrative   Not on file   Social Drivers of Health   Financial Resource Strain: Not on file  Food Insecurity: No Food Insecurity (08/19/2023)   Hunger Vital Sign    Worried About Running Out of Food in the Last Year: Never true    Ran Out of Food in the Last Year: Never true  Transportation Needs: No Transportation Needs (08/19/2023)   PRAPARE - Administrator, Civil Service (Medical): No    Lack of Transportation (Non-Medical): No  Physical Activity: Not on file  Stress: Not on file  Social Connections: Not on file  Intimate Partner Violence: Not At Risk (08/19/2023)   Humiliation, Afraid, Rape, and Kick questionnaire    Fear of Current or Ex-Partner: No    Emotionally Abused: No    Physically Abused: No    Sexually Abused: No     Family History:   The patient's family history includes Breast cancer in her maternal aunt; Cancer in her mother; Diabetes in her maternal grandmother; Heart disease in her father; Hypertension in her mother.    ROS:  Please see the history of present illness.  All other ROS reviewed and negative.     Physical Exam/Data: Vitals:   04/18/24 1126 04/18/24 1127  BP:  139/88  Pulse: (!) 58   Resp: 18   Temp: 98.2 F (36.8 C)   TempSrc: Oral   SpO2: 98%   Weight: 49 kg   Height: 4\' 11"  (1.499 m)    No intake or output data in the 24 hours ending 04/18/24 1132    04/18/2024   11:26 AM 03/31/2024    1:04 PM 03/20/2024    8:20 AM  Last 3 Weights  Weight (lbs) 108 lb 108 lb 108 lb 9.6 oz  Weight (kg) 48.988 kg 48.988 kg 49.261 kg     Body mass index is 21.81 kg/m.  General:  Well nourished, well developed, in no acute distress HEENT:  normal Neck: no JVD Vascular: No carotid bruits; Distal pulses 2+ bilaterally   Cardiac:  normal S1, S2; RRR 3/6 SEM Lungs:  clear to auscultation bilaterally, no wheezing, rhonchi or rales  Abd: soft, nontender, no hepatomegaly  Ext: no edema Musculoskeletal:  No deformities, BUE and BLE strength normal and equal Skin: warm and dry  Neuro:  CNs 2-12 intact, no focal abnormalities noted Psych:  Normal affect   EKG:  The ECG that was done May 2025 was personally reviewed and demonstrates SR with BBs  Relevant CV Studies: TTE October 2024 1. Left ventricular ejection fraction, by estimation, is 65 to 70%. The left ventricle  has normal function. The left ventricle has no regional wall motion abnormalities. There is moderate asymmetric left ventricular hypertrophy of the septal segment. Left ventricular diastolic parameters are consistent with Grade I diastolic dysfunction (impaired relaxation). The average left ventricular global longitudinal strain is -21.9 %. The global longitudinal strain is normal. 2. Right ventricular systolic function is normal. The right ventricular size is normal. Tricuspid regurgitation signal is inadequate for assessing PA pressure. 3. The mitral valve is grossly normal. Trivial mitral valve regurgitation. No evidence of mitral stenosis. 4. The aortic valve is abnormal. There is moderate calcification of the aortic valve. Aortic valve regurgitation is mild. Mild to moderate aortic valve stenosis. Aortic valve area, by VTI measures 0.73 cm. Aortic valve mean gradient measures 19.0 mmHg. Aortic valve Vmax measures 2.86 m/s. 5. The inferior vena cava is normal in size with greater than 50% respiratory variability, suggesting right atrial pressure of 3 mmHg. 6. Cannot exclude a small PFO.  Coronary angiography 2025 1.  Moderate disease of the proximal LAD and right coronary arteries.  FFR angiography (3D mapping) assessment of the proximal LAD was 0.81 and of the  proximal RCA was 0.93 so further interventions were deferred. 2.  Fick cardiac output of 3.0 L/min and Fick cardiac index of 2.1 L/min/m with the following hemodynamics: Right atrial pressure mean of 12 mmHg Right ventricular pressure 45/11 with an end-diastolic pressure of 19 mmHg Wedge pressure mean of 13 mmHg with V waves to 23 mmHg PA pressure 40/23 with a mean of 30 mmHg PVR 5.6 Woods units PA pulsatility index of 1.4 3.  Mixed pulmonary hypertension with elevated wedge pressure and elevated PVR consistent with pulmonary arterial hypertension. 4.  Simultaneous aortic and left ventricular pressures were assessed with a Langston catheter resulting in a mean gradient of 26 mmHg, aortic valve area of 0.71 cm, and indexed aortic valve area of 0.5 cm/m consistent with severe aortic stenosis. 5.  Capacious iliofemoral vessels bilaterally; of note the bifurcations bilaterally are quite high and if a transcatheter aortic valve replacement is pursued the right femoral approach would be preferred.   Recommendation: Continue evaluation for aortic valve intervention.   Will start Lasix  20 mg daily for elevated filling pressures.  The results were reviewed with Dr. Alvis Ba.    Laboratory Data: High Sensitivity Troponin:  No results for input(s): "TROPONINIHS" in the last 720 hours.    Chemistry Recent Labs  Lab 04/14/24 0855  NA 136  K 3.9  CL 101  CO2 23  GLUCOSE 102*  BUN 22  CREATININE 1.12*  CALCIUM  9.8  GFRNONAA 49*  ANIONGAP 12    Recent Labs  Lab 04/14/24 0855  PROT 7.5  ALBUMIN 4.5  AST 22  ALT 16  ALKPHOS 72  BILITOT 1.3*   Lipids No results for input(s): "CHOL", "TRIG", "HDL", "LABVLDL", "LDLCALC", "CHOLHDL" in the last 168 hours. Hematology Recent Labs  Lab 04/14/24 0855  WBC 7.4  RBC 5.01  HGB 14.8  HCT 46.0  MCV 91.8  MCH 29.5  MCHC 32.2  RDW 12.4  PLT 183   Thyroid No results for input(s): "TSH", "FREET4" in the last 168 hours. BNPNo results for  input(s): "BNP", "PROBNP" in the last 168 hours.  DDimer No results for input(s): "DDIMER" in the last 168 hours.  Radiology/Studies:  No results found.   Assessment and Plan: AS:  Elective TAVR today with 23 RTF today.   CAD:  Moderate, patient is asymptomatic.   Pulmonary hypertension: Mixed etiology with  elevated PVR and elevated wedge pressure.  May require diuretics.  Could consider referral to advanced heart failure after aortic stenosis has been managed. Hypertension: Continue losartan  25mg .   Hyperlipidemia:  Continue crestor  20mg . Aortic atherosclerosisis: Continue Crestor  20 mg daily. CKDIIIa:  Continue losartan  25mg  qday.  Risk Assessment/Risk Scores:      New [place]  Heart Association (NYHA) Functional Class NYHA Class II   Code Status: Full Code  Severity of Illness: The appropriate patient status for this patient is INPATIENT. Inpatient status is judged to be reasonable and necessary in order to provide the required intensity of service to ensure the patient's safety. The patient's presenting symptoms, physical exam findings, and initial radiographic and laboratory data in the context of their chronic comorbidities is felt to place them at high risk for further clinical deterioration. Furthermore, it is not anticipated that the patient will be medically stable for discharge from the hospital within 2 midnights of admission.   * I certify that at the point of admission it is my clinical judgment that the patient will require inpatient hospital care spanning beyond 2 midnights from the point of admission due to high intensity of service, high risk for further deterioration and high frequency of surveillance required.*  For questions or updates, please contact [facility] Please consult [email or web address] for contact info under     Signed, Vennesa Bastedo K Euan Wandler, MD  04/18/2024 11:32 AM

## 2024-04-18 NOTE — Op Note (Signed)
 [facility]  TAVR OPERATIVE NOTE   Date of Procedure:  04/18/2024  Preoperative Diagnosis: Severe Aortic Stenosis   Postoperative Diagnosis: Same   Procedure:   Transcatheter Aortic Valve Replacement - Transfemoral Approach  Edwards Sapien 3 Resilia THV (size 23 mm, model # [id number],)   Co-Surgeons:   Starleen Eastern, MD and Alyssa Backbone, MD Anesthesiologist:  Harwood Lingo  Echocardiographer:  Paulita Boss  Pre-operative Echo Findings: Severe aortic stenosis Normal left ventricular systolic function  Post-operative Echo Findings: No paravalvular leak Normal left ventricular systolic function  Left Heart Catheterization Findings: Left ventricular end-diastolic pressure of   BRIEF CLINICAL NOTE AND INDICATIONS FOR SURGERY  The patient is an 83 year old female with a history of coronary artery disease, pulmonary hypertension, hyperlipidemia, hypertension, CKD stage IIIa, and severe aortic stenosis who is referred for elective transcatheter aortic valve replacement with a 23 mm SAPIEN 3 valve from the right transfemoral approach.  During the course of the patient's preoperative work up they have been evaluated comprehensively by a multidisciplinary team of specialists coordinated through the Multidisciplinary Heart Valve Clinic in the [facility].  They have been demonstrated to suffer from symptomatic severe aortic stenosis as noted above. The patient has been counseled extensively as to the relative risks and benefits of all options for the treatment of severe aortic stenosis including long term medical therapy, conventional surgery for aortic valve replacement, and transcatheter aortic valve replacement.  The patient has been independently evaluated by Dr. Deloise Ferries with CT surgery and they are felt to be at high risk for conventional surgical aortic valve replacement. The surgeon indicated the patient would be a poor candidate for  conventional surgery. Based upon review of all of the patient's preoperative diagnostic tests they are felt to be candidate for transcatheter aortic valve replacement using the transfemoral approach as an alternative to high risk conventional surgery.    Following the decision to proceed with transcatheter aortic valve replacement, a discussion has been held regarding what types of management strategies would be attempted intraoperatively in the event of life-threatening complications, including whether or not the patient would be considered a candidate for the use of cardiopulmonary bypass and/or conversion to open sternotomy for attempted surgical intervention.  The patient has been advised of a variety of complications that might develop peculiar to this approach including but not limited to risks of death, stroke, paravalvular leak, aortic dissection or other major vascular complications, aortic annulus rupture, device embolization, cardiac rupture or perforation, acute myocardial infarction, arrhythmia, heart block or bradycardia requiring permanent pacemaker placement, congestive heart failure, respiratory failure, renal failure, pneumonia, infection, other late complications related to structural valve deterioration or migration, or other complications that might ultimately cause a temporary or permanent loss of functional independence or other long term morbidity.  The patient provides full informed consent for the procedure as described and all questions were answered preoperatively.    DETAILS OF THE OPERATIVE PROCEDURE  PREPARATION:   The patient is brought to the operating room on the above mentioned date and central monitoring was established by the anesthesia team. The patient is placed in the supine position on the operating table.  Intravenous antibiotics are administered. Conscious sedation is used.   Baseline transthoracic echocardiogram was performed. The patient's chest, abdomen, both  groins, and both lower extremities are prepared and draped in a sterile manner. A time out procedure is performed.   PERIPHERAL ACCESS:   Using the modified Seldinger technique, femoral arterial and  venous access were obtained with placement of a 6 Fr sheath in the left common femoral artery.  Aortic root angiography was performed in order to determine the optimal angiographic angle for valve deployment.  TRANSFEMORAL ACCESS:  A micropuncture kit was used to gain access to the right common femoral artery using u/s guidance. Position confirmed with angiography. Pre-closure with double ProGlide closure devices. The patient was heparinized systemically and ACT verified > 250 seconds.    A 14 Fr transfemoral E-sheath was introduced into the right common femoral artery after progressively dilating over an Amplatz superstiff wire. An AL-1 catheter was used to direct a straight-tip exchange length wire across the native aortic valve into the left ventricle. This was exchanged out for a pigtail catheter and position was confirmed in the LV apex. Simultaneous left ventricular, aortic, and left ventricular end-diastolic pressures were recorded.  The pigtail catheter was then exchanged for an Safari wire in the LV apex.  Direct LV pacing thresholds were assessed and found to be adequate.   TRANSCATHETER HEART VALVE DEPLOYMENT:  An Edwards Sapien 3 THV (size 23 mm) was prepared and crimped per manufacturer's guidelines, and the proper orientation of the valve is confirmed on the [employer] delivery system. The valve was advanced through the introducer sheath using normal technique until in an appropriate position in the abdominal aorta beyond the sheath tip. The balloon was then retracted and using the fine-tuning wheel was centered on the valve. The valve was then advanced across the aortic arch using appropriate flexion of the catheter. The valve was carefully positioned across the aortic valve annulus. The  Commander catheter was retracted using normal technique. Once final position of the valve has been confirmed by angiographic assessment, the valve is deployed while temporarily holding ventilation and during rapid ventricular pacing to maintain systolic blood pressure < 50 mmHg and pulse pressure < 10 mmHg. The balloon inflation is held for >3 seconds after reaching full deployment volume. Once the balloon has fully deflated the balloon is retracted into the ascending aorta and valve function is assessed using TTE. There is felt to be no paravalvular leak and no central aortic insufficiency.  The patient's hemodynamic recovery following valve deployment is good.  The deployment balloon and guidewire are both removed. Echo demostrated acceptable post-procedural gradients, stable mitral valve function, and no AI.   PROCEDURE COMPLETION:  The sheath was then removed and closure devices were completed. Protamine was administered once femoral arterial repair was complete. The temporary pacemaker, pigtail catheters and femoral sheaths were removed with  a Perclose closure device placed in the artery and manual pressure used for venous hemostasis.    The patient tolerated the procedure well and is transported to the surgical intensive care in stable condition. There were no immediate intraoperative complications. All sponge instrument and needle counts are verified correct at completion of the operation.   No blood products were administered during the operation.  The patient received a total of 70 mL of intravenous contrast during the procedure.  Zahra Peffley K Jocsan Mcginley MD 04/18/2024 5:51 PM

## 2024-04-18 NOTE — Op Note (Signed)
 [facility]  TAVR OPERATIVE NOTE     Date of Procedure:                04/18/2024   Preoperative Diagnosis:      Severe Aortic Stenosis    Postoperative Diagnosis:    Same    Procedure:        Transcatheter Aortic Valve Replacement - Transfemoral Approach             Edwards Sapien 3 Resilia THV (size 23 mm, model # [id number],)              Co-Surgeons:                         Linnie Rayas, MD and Lurena Red, MD Anesthesiologist:                  Epifanio   Echocardiographer:              [place]   Pre-operative Echo Findings: Severe aortic stenosis Normal left ventricular systolic function   Post-operative Echo Findings: No paravalvular leak Normal left ventricular systolic function   Left Heart Catheterization Findings: Left ventricular end-diastolic pressure of     BRIEF CLINICAL NOTE AND INDICATIONS FOR SURGERY   The patient is an 83 year old female with a history of coronary artery disease, pulmonary hypertension, hyperlipidemia, hypertension, CKD stage IIIa, and severe aortic stenosis who is referred for elective transcatheter aortic valve replacement with a 23 mm SAPIEN 3 valve from the right transfemoral approach.   During the course of the patient's preoperative work up they have been evaluated comprehensively by a multidisciplinary team of specialists coordinated through the Multidisciplinary Heart Valve Clinic in the [facility].  They have been demonstrated to suffer from symptomatic severe aortic stenosis as noted above. The patient has been counseled extensively as to the relative risks and benefits of all options for the treatment of severe aortic stenosis including long term medical therapy, conventional surgery for aortic valve replacement, and transcatheter aortic valve replacement.  The patient has been independently evaluated by Dr. Rayas with CT surgery and they are felt to be at high risk for  conventional surgical aortic valve replacement. The surgeon indicated the patient would be a poor candidate for conventional surgery. Based upon review of all of the patient's preoperative diagnostic tests they are felt to be candidate for transcatheter aortic valve replacement using the transfemoral approach as an alternative to high risk conventional surgery.     Following the decision to proceed with transcatheter aortic valve replacement, a discussion has been held regarding what types of management strategies would be attempted intraoperatively in the event of life-threatening complications, including whether or not the patient would be considered a candidate for the use of cardiopulmonary bypass and/or conversion to open sternotomy for attempted surgical intervention.  The patient has been advised of a variety of complications that might develop peculiar to this approach including but not limited to risks of death, stroke, paravalvular leak, aortic dissection or other major vascular complications, aortic annulus rupture, device embolization, cardiac rupture or perforation, acute myocardial infarction, arrhythmia, heart block or bradycardia requiring permanent pacemaker placement, congestive heart failure, respiratory failure, renal failure, pneumonia, infection, other late complications related to structural valve deterioration or migration, or other complications that might ultimately cause a temporary or permanent loss of functional independence or other long term morbidity.  The patient provides full informed  consent for the procedure as described and all questions were answered preoperatively.       DETAILS OF THE OPERATIVE PROCEDURE   PREPARATION:   The patient is brought to the operating room on the above mentioned date and central monitoring was established by the anesthesia team. The patient is placed in the supine position on the operating table.  Intravenous antibiotics are administered.  Conscious sedation is used.    Baseline transthoracic echocardiogram was performed. The patient's chest, abdomen, both groins, and both lower extremities are prepared and draped in a sterile manner. A time out procedure is performed.     PERIPHERAL ACCESS:   Using the modified Seldinger technique, femoral arterial and venous access were obtained with placement of a 6 Fr sheath in the left common femoral artery.  Aortic root angiography was performed in order to determine the optimal angiographic angle for valve deployment.   TRANSFEMORAL ACCESS:  A micropuncture kit was used to gain access to the right common femoral artery using u/s guidance. Position confirmed with angiography. Pre-closure with double ProGlide closure devices. The patient was heparinized systemically and ACT verified > 250 seconds.     A 14 Fr transfemoral E-sheath was introduced into the right common femoral artery after progressively dilating over an Amplatz superstiff wire. An AL-1 catheter was used to direct a straight-tip exchange length wire across the native aortic valve into the left ventricle. This was exchanged out for a pigtail catheter and position was confirmed in the LV apex. Simultaneous left ventricular, aortic, and left ventricular end-diastolic pressures were recorded.  The pigtail catheter was then exchanged for an Safari wire in the LV apex.  Direct LV pacing thresholds were assessed and found to be adequate.    TRANSCATHETER HEART VALVE DEPLOYMENT:  An Edwards Sapien 3 THV (size 23 mm) was prepared and crimped per manufacturer's guidelines, and the proper orientation of the valve is confirmed on the [employer] delivery system. The valve was advanced through the introducer sheath using normal technique until in an appropriate position in the abdominal aorta beyond the sheath tip. The balloon was then retracted and using the fine-tuning wheel was centered on the valve. The valve was then advanced across the  aortic arch using appropriate flexion of the catheter. The valve was carefully positioned across the aortic valve annulus. The Commander catheter was retracted using normal technique. Once final position of the valve has been confirmed by angiographic assessment, the valve is deployed while temporarily holding ventilation and during rapid ventricular pacing to maintain systolic blood pressure < 50 mmHg and pulse pressure < 10 mmHg. The balloon inflation is held for >3 seconds after reaching full deployment volume. Once the balloon has fully deflated the balloon is retracted into the ascending aorta and valve function is assessed using TTE. There is felt to be no paravalvular leak and no central aortic insufficiency.  The patient's hemodynamic recovery following valve deployment is good.  The deployment balloon and guidewire are both removed. Echo demostrated acceptable post-procedural gradients, stable mitral valve function, and no AI.    PROCEDURE COMPLETION:  The sheath was then removed and closure devices were completed. Protamine  was administered once femoral arterial repair was complete. The temporary pacemaker, pigtail catheters and femoral sheaths were removed with  a Perclose closure device placed in the artery and manual pressure used for venous hemostasis.     The patient tolerated the procedure well and is transported to the surgical intensive care in stable condition. There were  no immediate intraoperative complications. All sponge instrument and needle counts are verified correct at completion of the operation.    No blood products were administered during the operation.   The patient received a total of 70 mL of intravenous contrast during the procedure.

## 2024-04-18 NOTE — Transfer of Care (Signed)
 Immediate Anesthesia Transfer of Care Note  Patient: YAMARI VENTOLA  Procedure(s) Performed: Transcatheter Aortic Valve Replacement, Transfemoral (Right) ECHOCARDIOGRAM, TRANSTHORACIC  Patient Location: Cath Lab  Anesthesia Type:MAC  Level of Consciousness: awake and drowsy  Airway & Oxygen Therapy: Patient Spontanous Breathing and Patient connected to nasal cannula oxygen  Post-op Assessment: Report given to RN and Post -op Vital signs reviewed and stable  Post vital signs: Reviewed and stable  Last Vitals:  Vitals Value Taken Time  BP 109/81 1758  Temp    Pulse 55 1758  Resp    SpO2 96% 1758    Last Pain:  Vitals:   04/18/24 1153  TempSrc:   PainSc: 0-No pain         Complications: No notable events documented.

## 2024-04-18 NOTE — Anesthesia Preprocedure Evaluation (Signed)
 Anesthesia Evaluation  Patient identified by MRN, date of birth, ID band Patient awake    Reviewed: Allergy & Precautions, H&P , NPO status , Patient's Chart, lab work & pertinent test results  Airway Mallampati: II  TM Distance: >3 FB Neck ROM: Full    Dental no notable dental hx. (+) Teeth Intact, Dental Advisory Given   Pulmonary COPD,  COPD inhaler, Current Smoker and Patient abstained from smoking.   Pulmonary exam normal breath sounds clear to auscultation       Cardiovascular hypertension, Pt. on medications + Valvular Problems/Murmurs AS  Rhythm:Regular Rate:Normal     Neuro/Psych negative neurological ROS  negative psych ROS   GI/Hepatic Neg liver ROS,GERD  Medicated,,  Endo/Other  Hypothyroidism    Renal/GU negative Renal ROS  negative genitourinary   Musculoskeletal   Abdominal   Peds  Hematology negative hematology ROS (+)   Anesthesia Other Findings   Reproductive/Obstetrics negative OB ROS                             Anesthesia Physical Anesthesia Plan  ASA: 4  Anesthesia Plan: MAC   Post-op Pain Management: Minimal or no pain anticipated   Induction: Intravenous  PONV Risk Score and Plan: 1 and Propofol  infusion, Ondansetron  and Treatment may vary due to age or medical condition  Airway Management Planned: Natural Airway and Simple Face Mask  Additional Equipment:   Intra-op Plan:   Post-operative Plan:   Informed Consent: I have reviewed the patients History and Physical, chart, labs and discussed the procedure including the risks, benefits and alternatives for the proposed anesthesia with the patient or authorized representative who has indicated his/her understanding and acceptance.     Dental advisory given  Plan Discussed with: CRNA  Anesthesia Plan Comments:        Anesthesia Quick Evaluation

## 2024-04-18 NOTE — Progress Notes (Signed)
 BP improved with volume replacement. Report given to 4E RN. Family updated

## 2024-04-18 NOTE — Progress Notes (Signed)
  Echocardiogram 2D Echocardiogram has been performed.  Gina Harrell 04/18/2024, 5:35 PM

## 2024-04-18 NOTE — Progress Notes (Signed)
 Albumin delayed d/t loss of IV access. Initiated once IV established.

## 2024-04-18 NOTE — Discharge Instructions (Signed)

## 2024-04-18 NOTE — Progress Notes (Signed)
 Dr. Brain Cahill updated on Pt's BP. 89-95/50-60(60-64). HR 45-60 Sinus. Pt alert and talking to RN. Verbal orders given for 250ml Albumin.

## 2024-04-18 NOTE — Discharge Summary (Incomplete)
 HEART AND VASCULAR CENTER   MULTIDISCIPLINARY HEART VALVE TEAM  Discharge Summary    Patient ID: GENOA FREYRE MRN: [id number]; DOB: [birth year]  Admit date: 04/18/2024 Discharge date: 04/19/2024  PCP:  Suan Elm, MD  [facility] Cardiologist:  Luana Rumple, MD  [facility] Structural heart: Kyra Phy, MD [facility] Electrophysiologist:  None   Discharge Diagnoses    Principal Problem:   S/P TAVR (transcatheter aortic valve replacement) Active Problems:   History of bladder cancer   HTN (hypertension)   Severe aortic stenosis   Hypercholesteremia   Hypothyroidism   Allergies No Known Allergies  Diagnostic Studies/Procedures    HEART AND VASCULAR CENTER  TAVR OPERATIVE NOTE     Date of Procedure:                04/18/2024   Preoperative Diagnosis:      Severe Aortic Stenosis    Postoperative Diagnosis:    Same    Procedure:        Transcatheter Aortic Valve Replacement - Transfemoral Approach             Edwards Sapien 3 Resilia THV (size 23 mm, model # [id number],)              Co-Surgeons:                         Starleen Eastern, MD and Alyssa Backbone, MD Anesthesiologist:                  Harwood Lingo   Echocardiographer:              Paulita Boss   Pre-operative Echo Findings: Severe aortic stenosis Normal left ventricular systolic function   Post-operative Echo Findings: No paravalvular leak Normal left ventricular systolic function   Left Heart Catheterization Findings: Left ventricular end-diastolic pressure of  _____________    Echo 04/19/24: completed but pending formal read at the time of discharge   History of Present Illness     HARLEY MCCARTNEY is a 83 y.o. 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   Echo 08/17/24  showed EF 65%, mod asymmetric LVH, mild AI and severe pLFLG AS with mean grad 19 mmHg, AVA 0.73 cm2, DVI 0.32, SVI 32. L/RHC 01/31/24 showed moderate nonobstructive CAD (proximal LAD 50%, proximal RCA 50%) and hemodynamic measurements confirm the impression of low-flow low gradient aortic stenosis. There was mixed pulmonary hypertension with elevated wedge pressure and elevated PVR consistent with pulmonary arterial hypertension and elevated filling pressures. She was started on lasix  20mg  daily. Post cath she had a hematoma but not PSA by ultrasound.   The patient was evaluated by the multidisciplinary valve team and felt to have severe, symptomatic aortic stenosis and to be a suitable candidate for TAVR, which was set up for 04/18/24.   Hospital Course     Consultants: none   Severe pLFLG AS:  -- S/p successful TAVR with a 23 mm Edwards Sapien 3 Ultra Resilia THV via the TF approach on 04/18/24.  -- Post operative echo completed but pending formal read. -- Groin sites are stable.  -- ECG with sinus and no high grade heart block. -- Started on a baby Asprin 81mg  daily.  -- Met with cardiac rehab to discuss  CRP phase II.  -- Plan for discharge home today with close follow up in the outpatient setting.   HTN: -- BP well controlled.  -- Resume home losartan  25 mg daily and Lasix  20mg  BID  HLD: -- Continue Crestor  20mg  daily.   COPD with ongoing tobacco abuse: -- Cessation advised.   HFpEF: -- Well compensated. LVEDP 8 mm hg at the time of TAVR. -- Continue home Lasix  20mg  BID.  Carotid stenosis:  -- Dopplers 07/29/23 showed 40-59% LICA stenosis and RICA 1-39%. -- Started on a baby Asprin 81mg  daily.  -- Continue Crestor  20mg  daily.   _____________  Discharge Vitals Blood pressure 122/68, pulse 60, temperature 98.2 F (36.8 C), temperature source Oral, resp. rate 14, height 4' 11 (1.499 m), weight 49 kg, SpO2 95%.  Filed Weights   04/18/24 1126  Weight: 49 kg     GEN: Well  nourished, well developed in no acute distress NECK: No JVD CARDIAC: RRR, soft flow murmur @ RUSB. No rubs, gallops RESPIRATORY:  Clear to auscultation without rales, wheezing or rhonchi  ABDOMEN: Soft, non-tender, non-distended EXTREMITIES:  No edema; No deformity.  Groin sites clear without hematoma or ecchymosis.    Disposition   Pt is being discharged home today in good condition.  Follow-up Plans & Appointments     Follow-up Information     Ardia Kraft, PA-C Follow up on 04/24/2024.   Specialties: Cardiology, Radiology Why: @ 9:55am, please arrive at least 15 minutes early Contact information: [address] [place] [address] [phone]                Discharge Instructions     Amb Referral to Cardiac Rehabilitation   Complete by: As directed    Diagnosis: Valve Replacement   Valve: Aortic   After initial evaluation and assessments completed: Virtual Based Care may be provided alone or in conjunction with Phase 2 Cardiac Rehab based on patient barriers.: Yes   Intensive Cardiac Rehabilitation (ICR) [facility] location only OR Traditional Cardiac Rehabilitation (TCR) *If criteria for ICR are not met will enroll in TCR [facility] only): Yes       Discharge Medications   Allergies as of 04/19/2024   No Known Allergies      Medication List     TAKE these medications    albuterol  108 (90 Base) MCG/ACT inhaler Commonly known as: VENTOLIN  HFA Inhale 1 puff into the lungs every 4 (four) hours as needed for shortness of breath.   ALPRAZolam  0.5 MG tablet Commonly known as: XANAX  Take 0.5 mg by mouth at bedtime.   amoxicillin 500 MG capsule Commonly known as: AMOXIL Take 2,000 mg by mouth See admin instructions. Take 1 hour prior to dental visit   aspirin  81 MG chewable tablet Chew 1 tablet (81 mg total) by mouth daily. Start taking on: April 20, 2024   Cholecalciferol 50 MCG (2000 UT) Caps Take 2,000 Units by mouth daily.   CRANBERRY EXTRACT  PO Take 2 capsules by mouth daily.   diphenhydramine-acetaminophen  25-500 MG Tabs tablet Commonly known as: TYLENOL  PM Take 2 tablets by mouth at bedtime. Reported on 12/26/2015   escitalopram  10 MG tablet Commonly known as: LEXAPRO  Take 5 mg by mouth at bedtime.   furosemide  20 MG tablet Commonly known as: Lasix  Take 1 tablet (20 mg total) by mouth 2 (two) times daily.   levothyroxine  50 MCG tablet Commonly known as: SYNTHROID  Take 50 mcg by mouth daily before breakfast.   losartan  25 MG tablet  Commonly known as: COZAAR  Take 1 tablet (25 mg total) by mouth daily.   rosuvastatin  20 MG tablet Commonly known as: CRESTOR  Take 20 mg by mouth daily.          Outstanding Labs/Studies   none  ______________________  Duration of Discharge Encounter: APP Time: 15  minutes    Signed, Abagail Hoar, PA-C 04/19/2024, 10:19 AM [phone]   ATTENDING ATTESTATION:  After conducting a review of all available clinical information with the care team, interviewing the patient, and performing a physical exam, I agree with the findings and plan described in this note.   GEN: No acute distress.   HEENT:  MMM, no JVD, no scleral icterus Cardiac: RRR, no murmurs, rubs, or gallops.  Respiratory: Clear to auscultation bilaterally. GI: Soft, nontender, non-distended  MS: No edema; No deformity. Neuro:  Nonfocal  Vasc:  +2 radial pulses; femoral access sites stable  Patient with limited abdominal pain this AM which has resolved.  Otherwise uncomplicated TAVR yesterday.  Groins are stable, conduction intact, and no signs of CVA/TIA.  Follow up TTE and plan on d/c later today.  APP discharge time:15 MD discharge time:20  Alyssa Backbone, MD Pager [phone]

## 2024-04-18 NOTE — Anesthesia Postprocedure Evaluation (Signed)
 Anesthesia Post Note  Patient: Gina Harrell  Procedure(s) Performed: Transcatheter Aortic Valve Replacement, Transfemoral (Right) ECHOCARDIOGRAM, TRANSTHORACIC     Patient location during evaluation: Other Anesthesia Type: MAC Level of consciousness: awake and alert Pain management: pain level controlled Vital Signs Assessment: post-procedure vital signs reviewed and stable Respiratory status: spontaneous breathing, nonlabored ventilation and respiratory function stable Cardiovascular status: stable and blood pressure returned to baseline Postop Assessment: no apparent nausea or vomiting Anesthetic complications: no  No notable events documented.  Last Vitals:  Vitals:   04/18/24 1945 04/18/24 2023  BP: 135/73 121/81  Pulse:  (!) 33  Resp:  15  Temp:    SpO2:  96%    Last Pain:  Vitals:   04/18/24 1830  TempSrc: Tympanic  PainSc:                  Elliyah Liszewski,W. EDMOND

## 2024-04-18 NOTE — Interval H&P Note (Signed)
 History and Physical Interval Note:  04/18/2024 4:05 PM  Gina Harrell  has presented today for surgery, with the diagnosis of Severe Aortic Stenosis.  The various methods of treatment have been discussed with the patient and family. After consideration of risks, benefits and other options for treatment, the patient has consented to  Procedure(s): Transcatheter Aortic Valve Replacement, Transfemoral (Right) ECHOCARDIOGRAM, TRANSTHORACIC (N/A) as a surgical intervention.  The patient's history has been reviewed, patient examined, no change in status, stable for surgery.  I have reviewed the patient's chart and labs.  Questions were answered to the patient's satisfaction.     Belita Warsame Ala [place]

## 2024-04-19 ENCOUNTER — Inpatient Hospital Stay (HOSPITAL_COMMUNITY)

## 2024-04-19 ENCOUNTER — Encounter (HOSPITAL_COMMUNITY): Payer: Self-pay | Admitting: Internal Medicine

## 2024-04-19 DIAGNOSIS — Z952 Presence of prosthetic heart valve: Secondary | ICD-10-CM

## 2024-04-19 DIAGNOSIS — I350 Nonrheumatic aortic (valve) stenosis: Secondary | ICD-10-CM

## 2024-04-19 LAB — CBC
HCT: 39.1 % (ref 36.0–46.0)
Hemoglobin: 12.9 g/dL (ref 12.0–15.0)
MCH: 30.1 pg (ref 26.0–34.0)
MCHC: 33 g/dL (ref 30.0–36.0)
MCV: 91.1 fL (ref 80.0–100.0)
Platelets: 137 10*3/uL — ABNORMAL LOW (ref 150–400)
RBC: 4.29 MIL/uL (ref 3.87–5.11)
RDW: 12.4 % (ref 11.5–15.5)
WBC: 8.1 10*3/uL (ref 4.0–10.5)
nRBC: 0 % (ref 0.0–0.2)

## 2024-04-19 LAB — BASIC METABOLIC PANEL WITH GFR
Anion gap: 7 (ref 5–15)
BUN: 18 mg/dL (ref 8–23)
CO2: 20 mmol/L — ABNORMAL LOW (ref 22–32)
Calcium: 8.6 mg/dL — ABNORMAL LOW (ref 8.9–10.3)
Chloride: 109 mmol/L (ref 98–111)
Creatinine, Ser: 0.89 mg/dL (ref 0.44–1.00)
GFR, Estimated: >60 mL/min (ref >60)
Glucose, Bld: 98 mg/dL (ref 70–99)
Potassium: 3.9 mmol/L (ref 3.5–5.1)
Sodium: 136 mmol/L (ref 135–145)

## 2024-04-19 LAB — ECHOCARDIOGRAM COMPLETE
AR max vel: 2.48 cm2
AV Area VTI: 2.47 cm2
AV Area mean vel: 2.53 cm2
AV Mean grad: 10 mmHg
AV Peak grad: 18.7 mmHg
Ao pk vel: 2.16 m/s
Area-P 1/2: 2.39 cm2
Calc EF: 72.3 %
Height: 59 in
S' Lateral: 2.3 cm
Single Plane A2C EF: 73.2 %
Single Plane A4C EF: 71.3 %
Weight: 1728 [oz_av]

## 2024-04-19 LAB — GLUCOSE, CAPILLARY: Glucose-Capillary: 109 mg/dL — ABNORMAL HIGH (ref 70–99)

## 2024-04-19 LAB — MAGNESIUM: Magnesium: 2.1 mg/dL (ref 1.7–2.4)

## 2024-04-19 MED ORDER — ASPIRIN 81 MG PO CHEW
81.0000 mg | CHEWABLE_TABLET | Freq: Every day | ORAL | Status: AC
Start: 2024-04-20 — End: ?

## 2024-04-19 NOTE — Progress Notes (Signed)
   04/19/24 1021  TOC Brief Assessment  Insurance and Status Reviewed  Patient has primary care physician Yes  Home environment has been reviewed home  Prior level of function: self/independent  Prior/Current Home Services No current home services  Social Drivers of Health Review SDOH reviewed no interventions necessary  Readmission risk has been reviewed Yes  Transition of care needs no transition of care needs at this time    Pt s/p TAVR, stable for transition home today, No HH or DME needs noted. Family to transport home

## 2024-04-19 NOTE — Plan of Care (Signed)

## 2024-04-19 NOTE — Progress Notes (Signed)
  Echocardiogram 2D Echocardiogram has been performed.  Gina Harrell 04/19/2024, 9:41 AM

## 2024-04-19 NOTE — Progress Notes (Signed)
 CARDIAC REHAB PHASE I     Post TAVR education including site care, restrictions, risk factors, heart healthy diet, exercise guidelines, smoking cessation and CRP2 reviewed. All questions and concerns addressed. Will send referral for CRP2 to [facility]. Plan for discharge home later today.  [id number] Ronny Colas, RN BSN 04/19/2024 10:09 AM

## 2024-04-20 ENCOUNTER — Telehealth: Payer: Self-pay | Admitting: Physician Assistant

## 2024-04-20 NOTE — Telephone Encounter (Signed)
  HEART AND VASCULAR CENTER   MULTIDISCIPLINARY HEART VALVE TEAM   Patient contacted regarding discharge from [facility] on 04/19/24  Patient understands to follow up with a structural heart provider on 04/24/24 at [address] [facility]. Patient understands discharge instructions? yes Patient understands medications and regimen? yes Patient understands to bring all medications to this visit? yes  Abagail Hoar PA-C  [facility]

## 2024-04-24 ENCOUNTER — Ambulatory Visit: Attending: Physician Assistant | Admitting: Physician Assistant

## 2024-04-24 VITALS — BP 128/78 | HR 56 | Ht 59.0 in | Wt 109.0 lb

## 2024-04-24 DIAGNOSIS — I6522 Occlusion and stenosis of left carotid artery: Secondary | ICD-10-CM

## 2024-04-24 DIAGNOSIS — I5032 Chronic diastolic (congestive) heart failure: Secondary | ICD-10-CM

## 2024-04-24 DIAGNOSIS — Z952 Presence of prosthetic heart valve: Secondary | ICD-10-CM | POA: Diagnosis not present

## 2024-04-24 DIAGNOSIS — I10 Essential (primary) hypertension: Secondary | ICD-10-CM

## 2024-04-24 DIAGNOSIS — J449 Chronic obstructive pulmonary disease, unspecified: Secondary | ICD-10-CM

## 2024-04-24 DIAGNOSIS — E7800 Pure hypercholesterolemia, unspecified: Secondary | ICD-10-CM

## 2024-04-24 MED ORDER — FUROSEMIDE 20 MG PO TABS
20.0000 mg | ORAL_TABLET | ORAL | 11 refills | Status: AC | PRN
Start: 2024-04-24 — End: ?

## 2024-04-24 NOTE — Patient Instructions (Signed)
 Medication Instructions:  Your physician has recommended you make the following change in your medication:  CHANGE LASIX  TO AS NEEDED   *If you need a refill on your cardiac medications before your next appointment, please call your pharmacy*  Lab Work: NONE If you have labs (blood work) drawn today and your tests are completely normal, you will receive your results only by: MyChart Message (if you have MyChart) OR A paper copy in the mail If you have any lab test that is abnormal or we need to change your treatment, we will call you to review the results.  Testing/Procedures: NONE  Follow-Up: At [facility], you and your health needs are our priority.  As part of our continuing mission to provide you with exceptional heart care, our providers are all part of one team.  This team includes your primary Cardiologist (physician) and Advanced Practice Providers or APPs (Physician Assistants and Nurse Practitioners) who all work together to provide you with the care you need, when you need it.  Your next appointment:   KEEP SCHEDULED FOLLOW-UP  We recommend signing up for the patient portal called MyChart.  Sign up information is provided on this After Visit Summary.  MyChart is used to connect with patients for Virtual Visits (Telemedicine).  Patients are able to view lab/test results, encounter notes, upcoming appointments, etc.  Non-urgent messages can be sent to your provider as well.   To learn more about what you can do with MyChart, go to [email or web address].

## 2024-04-24 NOTE — Progress Notes (Signed)
 HEART AND VASCULAR CENTER   MULTIDISCIPLINARY HEART VALVE CLINIC                                     Cardiology Office Note:    Date:  04/24/2024   ID:  Gina Harrell, DOB 05-11-1941, MRN [id number]  PCP:  Suan Elm, MD  [facility] Cardiologist:  Luana Rumple, MD  [facility] Structural heart: Kyra Phy, MD [facility] Electrophysiologist:  None   Referring MD: Suan Elm, MD   [facility] s/p TAVR   History of Present Illness:    Gina Harrell is a 83 y.o. female with a hx of CAD, pulm HTN, tobacco abuse with COPD on CT, HFpEF, HLD, HTN, CKD stage IIIa, pyelonephritis (2024), carotid artery disease with 40-59% LICA stenosis, and pLFLG severe aortic stenosis s/p TAVR (04/18/24) who presents to clinic for follow up.  Echo 08/17/24 showed EF 65%, mod asymmetric LVH, mild AI and severe pLFLG AS with mean grad 19 mmHg, AVA 0.73 cm2, DVI 0.32, SVI 32. L/RHC 01/31/24 showed moderate nonobstructive CAD (proximal LAD 50%, proximal RCA 50%) and hemodynamic measurements confirmed the impression of low-flow low gradient aortic stenosis. There was mixed pulmonary hypertension with elevated wedge pressure and elevated PVR consistent with pulmonary arterial hypertension and elevated filling pressures. She was started on lasix  20mg  daily. Post cath she had a hematoma but no PSA by ultrasound. S/p TAVR with a 23 mm Edwards Sapien 3 Ultra Resilia THV via the TF approach on 04/18/24. Post operative echo showed EF 70%, normally functioning TAVR with a mean gradient of 10 mmHg and no PVL. Started on a baby Asprin 81mg  daily.   Today the patient presents to clinic for follow up. Here alone. No CP or SOB. No LE edema, orthopnea or PND. No dizziness or syncope. No blood in stool or urine. No palpitations. She does have some fatigue.    Past Medical History:  Diagnosis Date   Bladder cancer (HCC)    COPD (chronic obstructive pulmonary disease) (HCC)    Elevated cholesterol    GERD  (gastroesophageal reflux disease)    Hypertension    Hypothyroidism    Osteoporosis    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 Lorie Rook and Dr. Deloise Ferries.   Severe aortic stenosis    Uterine prolapse      Current Medications: Current Meds  Medication Sig   albuterol  (VENTOLIN  HFA) 108 (90 Base) MCG/ACT inhaler Inhale 1 puff into the lungs every 4 (four) hours as needed for shortness of breath.   ALPRAZolam  (XANAX ) 0.5 MG tablet Take 0.5 mg by mouth at bedtime.    amoxicillin (AMOXIL) 500 MG capsule Take 2,000 mg by mouth See admin instructions. Take 1 hour prior to dental visit   aspirin  81 MG chewable tablet Chew 1 tablet (81 mg total) by mouth daily.   Cholecalciferol 50 MCG (2000 UT) CAPS Take 2,000 Units by mouth daily.   CRANBERRY EXTRACT PO Take 2 capsules by mouth daily.   diphenhydramine-acetaminophen  (TYLENOL  PM) 25-500 MG TABS Take 2 tablets by mouth at bedtime. Reported on 12/26/2015   escitalopram  (LEXAPRO ) 10 MG tablet Take 5 mg by mouth at bedtime.   levothyroxine  (SYNTHROID , LEVOTHROID) 50 MCG tablet Take 50 mcg by mouth daily before breakfast.   losartan  (COZAAR ) 25 MG tablet Take  1 tablet (25 mg total) by mouth daily.   rosuvastatin  (CRESTOR ) 20 MG tablet Take 20 mg by mouth daily.   [DISCONTINUED] furosemide  (LASIX ) 20 MG tablet Take 1 tablet (20 mg total) by mouth 2 (two) times daily.      ROS:   Please see the history of present illness.    All other systems reviewed and are negative.  EKGs   EKG Interpretation Date/Time:  Monday April 24 2024 10:09:29 EDT Ventricular Rate:  56 PR Interval:  154 QRS Duration:  96 QT Interval:  452 QTC Calculation: 436 R Axis:   73  Text Interpretation: Sinus bradycardia When compared with ECG of 19-Apr-2024 02:53, No significant change was found Confirmed by Abagail Hoar [phone]) on 04/24/2024 10:16:50 AM   Risk  Assessment/Calculations:           Physical Exam:    VS:  BP 128/78   Pulse (!) 56   Ht 4' 11 (1.499 m)   Wt 109 lb (49.4 kg)   SpO2 99%   BMI 22.02 kg/m     Wt Readings from Last 3 Encounters:  04/24/24 109 lb (49.4 kg)  04/18/24 108 lb (49 kg)  03/31/24 108 lb (49 kg)     GEN: Well nourished, well developed in no acute distress NECK: No JVD CARDIAC: RRR, no murmurs, rubs, gallops RESPIRATORY:  Clear to auscultation without rales, wheezing or rhonchi  ABDOMEN: Soft, non-tender, non-distended EXTREMITIES:  No edema; No deformity.  Groin sites clear without hematoma or ecchymosis.   ASSESSMENT:    1. S/P TAVR (transcatheter aortic valve replacement)   2. Essential hypertension   3. Hypercholesteremia   4. Chronic obstructive pulmonary disease, unspecified COPD type (HCC)   5. Chronic heart failure with preserved ejection fraction (HCC)   6. Carotid stenosis, asymptomatic, left     PLAN:    In order of problems listed above:  Severe pLFLG AS s/p TAVR:  -- Pt doing well s/p TAVR. She is so pleased with her results.  -- ECG with no HAVB.  -- Groin sites healing well.  -- SBE prophylaxis discussed; she has amoxicillin.  -- Continue Aspirin  81mg  daily. -- Cleared to resume all activities without restriction. -- I have encouraged CRP2 participation.  -- She will be seen back for 1 month echo and OV with July with Hao Meng PA-C.    HTN: -- BP well controlled.  -- Continue losartan  25 mg daily. -- Will change Lasix  20mg  BID to PRN.    HLD: -- Continue Crestor  20mg  daily.    COPD with ongoing tobacco abuse: -- Cessation advised.  Down to 2 cigarettes a day.    HFpEF: -- Well compensated. LVEDP 8 mm hg at the time of TAVR. -- Change lasix  to as needed.    Carotid stenosis:  -- Dopplers 07/29/23 showed 40-59% LICA stenosis and RICA 1-39%. -- Continue baby Asprin 81mg  daily.  -- Continue Crestor  20mg  daily.      Cardiac Rehabilitation Eligibility  Assessment  The patient is ready to start cardiac rehabilitation from a cardiac standpoint.     Medication Adjustments/Labs and Tests Ordered: Current medicines are reviewed at length with the patient today.  Concerns regarding medicines are outlined above.  Orders Placed This Encounter  Procedures   EKG 12-Lead   Meds ordered this encounter  Medications   furosemide  (LASIX ) 20 MG tablet    Sig: Take 1 tablet (20 mg total) by mouth as needed for fluid or edema.  Dispense:  60 tablet    Refill:  11    Patient Instructions  Medication Instructions:  Your physician has recommended you make the following change in your medication:  CHANGE LASIX  TO AS NEEDED   *If you need a refill on your cardiac medications before your next appointment, please call your pharmacy*  Lab Work: NONE If you have labs (blood work) drawn today and your tests are completely normal, you will receive your results only by: MyChart Message (if you have MyChart) OR A paper copy in the mail If you have any lab test that is abnormal or we need to change your treatment, we will call you to review the results.  Testing/Procedures: NONE  Follow-Up: At [facility], you and your health needs are our priority.  As part of our continuing mission to provide you with exceptional heart care, our providers are all part of one team.  This team includes your primary Cardiologist (physician) and Advanced Practice Providers or APPs (Physician Assistants and Nurse Practitioners) who all work together to provide you with the care you need, when you need it.  Your next appointment:   KEEP SCHEDULED FOLLOW-UP  We recommend signing up for the patient portal called MyChart.  Sign up information is provided on this After Visit Summary.  MyChart is used to connect with patients for Virtual Visits (Telemedicine).  Patients are able to view lab/test results, encounter notes, upcoming appointments, etc.  Non-urgent  messages can be sent to your provider as well.   To learn more about what you can do with MyChart, go to [email or web address].      Signed, Abagail Hoar, PA-C  04/24/2024 7:00 PM    [facility]

## 2024-04-26 ENCOUNTER — Telehealth (HOSPITAL_COMMUNITY): Payer: Self-pay

## 2024-04-26 NOTE — Telephone Encounter (Signed)
 Attempted to call patient in regards to Cardiac Rehab - LM on VM

## 2024-04-28 ENCOUNTER — Encounter (HOSPITAL_COMMUNITY): Payer: Self-pay

## 2024-04-28 ENCOUNTER — Telehealth (HOSPITAL_COMMUNITY): Payer: Self-pay

## 2024-04-28 NOTE — Telephone Encounter (Signed)
Attempted to call patient in regards to Cardiac Rehab - LM on VM   Sent letter 

## 2024-05-18 ENCOUNTER — Encounter (HOSPITAL_COMMUNITY)
Admission: RE | Admit: 2024-05-18 | Discharge: 2024-05-18 | Disposition: A | Discharge status: Home / Self Care | Source: Ambulatory Visit | Attending: Cardiovascular Disease | Admitting: Cardiovascular Disease

## 2024-05-18 ENCOUNTER — Telehealth (HOSPITAL_COMMUNITY): Payer: Self-pay

## 2024-05-18 VITALS — BP 122/80 | HR 59 | Ht 59.0 in | Wt 112.6 lb

## 2024-05-18 DIAGNOSIS — Z952 Presence of prosthetic heart valve: Secondary | ICD-10-CM | POA: Insufficient documentation

## 2024-05-18 NOTE — Progress Notes (Signed)
 Cardiac Individual Treatment Plan  Patient Details  Name: Gina Harrell MRN: [id number] Date of Birth: 04-14-1941 Referring Provider:   Flowsheet Row INTENSIVE CARDIAC REHAB ORIENT from 05/18/2024 in [facility]  Referring Provider Dr. Jerel Balding, MD    Initial Encounter Date:  Flowsheet Row INTENSIVE CARDIAC REHAB ORIENT from 05/18/2024 in [facility]  Date 05/18/24    Visit Diagnosis: S/P TAVR (transcatheter aortic valve replacement)  Patient's Home Medications on Admission:  Current Outpatient Medications:    albuterol  (VENTOLIN  HFA) 108 (90 Base) MCG/ACT inhaler, Inhale 1 puff into the lungs every 4 (four) hours as needed for shortness of breath., Disp: , Rfl:    ALPRAZolam  (XANAX ) 0.5 MG tablet, Take 0.5 mg by mouth at bedtime. , Disp: , Rfl:    amoxicillin (AMOXIL) 500 MG capsule, Take 2,000 mg by mouth See admin instructions. Take 1 hour prior to dental visit, Disp: , Rfl:    aspirin  81 MG chewable tablet, Chew 1 tablet (81 mg total) by mouth daily., Disp: , Rfl:    Cholecalciferol 50 MCG (2000 UT) CAPS, Take 2,000 Units by mouth daily., Disp: , Rfl:    CRANBERRY EXTRACT PO, Take 2 capsules by mouth daily., Disp: , Rfl:    diphenhydramine-acetaminophen  (TYLENOL  PM) 25-500 MG TABS, Take 2 tablets by mouth at bedtime. Reported on 12/26/2015, Disp: , Rfl:    escitalopram  (LEXAPRO ) 10 MG tablet, Take 5 mg by mouth at bedtime., Disp: , Rfl:    furosemide  (LASIX ) 20 MG tablet, Take 1 tablet (20 mg total) by mouth as needed for fluid or edema., Disp: 60 tablet, Rfl: 11   levothyroxine  (SYNTHROID , LEVOTHROID) 50 MCG tablet, Take 50 mcg by mouth daily before breakfast., Disp: , Rfl:    losartan  (COZAAR ) 25 MG tablet, Take 1 tablet (25 mg total) by mouth daily., Disp: 30 tablet, Rfl: 3   rosuvastatin  (CRESTOR ) 20 MG tablet, Take 20 mg by mouth daily., Disp: , Rfl:    Past Medical History: Past Medical History:  Diagnosis Date   Bladder cancer (HCC)    COPD (chronic obstructive pulmonary disease) (HCC)    Elevated cholesterol    GERD (gastroesophageal reflux disease)    Hypertension    Hypothyroidism    Osteoporosis    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 Wendel and Dr. Shyrl.   Severe aortic stenosis    Uterine prolapse     Tobacco Use: Social History   Tobacco Use  Smoking Status Every Day   Current packs/day: 0.50   Average packs/day: 0.5 packs/day for 50.0 years (25.0 ttl pk-yrs)   Types: Cigarettes  Smokeless Tobacco Never  Tobacco Comments   03/20/2024 Patient smokes 8 cigarettes daily   01/03/2024 Patient states one pack last about three days    Labs: Review Flowsheet       Latest Ref Rng & Units 01/31/2024 04/18/2024  Labs for ITP Cardiac and Pulmonary Rehab  PH, Arterial 7.35 - 7.45 7.340  -  PCO2 arterial 32 - 48 mmHg 36.5  -  Bicarbonate 20.0 - 28.0 mmol/L 18.6  22.5  19.7  -  TCO2 22 - 32 mmol/L 20  24  21  22    Acid-base deficit 0.0 - 2.0 mmol/L 9.0  4.0  5.0  -  O2 Saturation % 55  61  92  -    Details       Multiple values from one day are sorted in reverse-chronological order         Capillary Blood Glucose: Lab Results  Component Value Date   GLUCAP 109 (H) 04/19/2024     Exercise Target Goals: Exercise Program Goal: Individual exercise prescription set using results from initial 6 min walk test and THRR while considering  patient's activity barriers and safety.   Exercise Prescription Goal: Initial exercise prescription builds to 30-45 minutes a day of aerobic activity, 2-3 days per week.  Home exercise guidelines will be given to patient during program as part of exercise prescription that the participant will acknowledge.  Activity Barriers & Risk Stratification:  Activity Barriers & Cardiac Risk  Stratification - 05/18/24 1532       Activity Barriers & Cardiac Risk Stratification   Activity Barriers Arthritis;Deconditioning;Back Problems;Neck/Spine Problems;History of Falls;Balance Concerns;Shortness of Breath    Cardiac Risk Stratification High          6 Minute Walk:  6 Minute Walk     Row Name 05/18/24 1506         6 Minute Walk   Phase Initial     Distance 1560 feet     Walk Time 6 minutes     # of Rest Breaks 0     MPH 2.95     METS 2.67     RPE 11     Perceived Dyspnea  1     VO2 Peak 9.36     Symptoms Yes (comment)     Comments Right hip pain 2/10 resolved with rest     Resting HR 59 bpm     Resting BP 122/80     Max Ex. HR 85 bpm     Max Ex. BP 134/82     2 Minute Post BP 128/78        Oxygen Initial Assessment:   Oxygen Re-Evaluation:   Oxygen Discharge (Final Oxygen Re-Evaluation):   Initial Exercise Prescription:  Initial Exercise Prescription - 05/18/24 1500       Date of Initial Exercise RX and Referring Provider   Date 05/18/24    Referring Provider Dr. Jerel Balding, MD    Expected Discharge Date 08/09/24      NuStep   Level 1    SPM 80    Minutes 15    METs 1.8      Track   Laps 15    Minutes 15    METs 2.2      Prescription Details   Frequency (times per week) 2    Duration Progress to 30 minutes of continuous aerobic without signs/symptoms of physical distress      Intensity   THRR 40-80% of Max Heartrate 56-111    Ratings of Perceived Exertion 11-13    Perceived Dyspnea 0-4      Progression   Progression Continue to progress workloads to maintain intensity without signs/symptoms of physical distress.      Resistance Training   Training Prescription Yes    Weight 2    Reps 10-15          Perform Capillary Blood Glucose checks as needed.  Exercise Prescription Changes:   Exercise Comments:   Exercise Goals and Review:   Exercise Goals     Row Name 05/18/24 1516             Exercise  Goals   Increase Physical Activity Yes  Intervention Provide advice, education, support and counseling about physical activity/exercise needs.;Develop an individualized exercise prescription for aerobic and resistive training based on initial evaluation findings, risk stratification, comorbidities and participant's personal goals.       Expected Outcomes Short Term: Attend rehab on a regular basis to increase amount of physical activity.;Long Term: Exercising regularly at least 3-5 days a week.;Long Term: Add in home exercise to make exercise part of routine and to increase amount of physical activity.       Increase Strength and Stamina Yes       Intervention Provide advice, education, support and counseling about physical activity/exercise needs.;Develop an individualized exercise prescription for aerobic and resistive training based on initial evaluation findings, risk stratification, comorbidities and participant's personal goals.       Expected Outcomes Short Term: Increase workloads from initial exercise prescription for resistance, speed, and METs.;Short Term: Perform resistance training exercises routinely during rehab and add in resistance training at home;Long Term: Improve cardiorespiratory fitness, muscular endurance and strength as measured by increased METs and functional capacity ( )       Able to understand and use rate of perceived exertion (RPE) scale Yes       Intervention Provide education and explanation on how to use RPE scale       Expected Outcomes Short Term: Able to use RPE daily in rehab to express subjective intensity level;Long Term:  Able to use RPE to guide intensity level when exercising independently       Knowledge and understanding of Target Heart Rate Range (THRR) Yes       Intervention Provide education and explanation of THRR including how the numbers were predicted and where they are located for reference       Expected Outcomes Short Term: Able to  state/look up THRR;Long Term: Able to use THRR to govern intensity when exercising independently;Short Term: Able to use daily as guideline for intensity in rehab       Understanding of Exercise Prescription Yes       Intervention Provide education, explanation, and written materials on patient's individual exercise prescription       Expected Outcomes Short Term: Able to explain program exercise prescription;Long Term: Able to explain home exercise prescription to exercise independently          Exercise Goals Re-Evaluation :   Discharge Exercise Prescription (Final Exercise Prescription Changes):   Nutrition:  Target Goals: Understanding of nutrition guidelines, daily intake of sodium 1500mg , cholesterol 200mg , calories 30% from fat and 7% or less from saturated fats, daily to have 5 or more servings of fruits and vegetables.  Biometrics:  Pre Biometrics - 05/18/24 1501       Pre Biometrics   Waist Circumference 31 inches    Hip Circumference 37.25 inches    Waist to Hip Ratio 0.83 %    Triceps Skinfold 13 mm    % Body Fat 32.7 %    Grip Strength 28 kg    Flexibility 14 in    Single Leg Stand 11 seconds           Nutrition Therapy Plan and Nutrition Goals:   Nutrition Assessments:  MEDIFICTS Score Key: >=70 Need to make dietary changes  40-70 Heart Healthy Diet <= 40 Therapeutic Level Cholesterol Diet    Picture Your Plate Scores: <59 Unhealthy dietary pattern with much room for improvement. 41-50 Dietary pattern unlikely to meet recommendations for good health and room for improvement. 51-60 More healthful dietary pattern, with  some room for improvement.  >60 Healthy dietary pattern, although there may be some specific behaviors that could be improved.    Nutrition Goals Re-Evaluation:   Nutrition Goals Re-Evaluation:   Nutrition Goals Discharge (Final Nutrition Goals Re-Evaluation):   Psychosocial: Target Goals: Acknowledge presence or absence of  significant depression and/or stress, maximize coping skills, provide positive support system. Participant is able to verbalize types and ability to use techniques and skills needed for reducing stress and depression.  Initial Review & Psychosocial Screening:  Initial Psych Review & Screening - 05/18/24 1518       Initial Review   Current issues with Current Anxiety/Panic;Current Stress Concerns    Comments Some work related stress      Family Dynamics   Good Support System? Yes      Barriers   Psychosocial barriers to participate in program The patient should benefit from training in stress management and relaxation.      Screening Interventions   Interventions Encouraged to exercise;To provide support and resources with identified psychosocial needs;Provide feedback about the scores to participant    Expected Outcomes Long Term Goal: Stressors or current issues are controlled or eliminated.;Short Term goal: Identification and review with participant of any Quality of Life or Depression concerns found by scoring the questionnaire.;Long Term goal: The participant improves quality of Life and PHQ9 Scores as seen by post scores and/or verbalization of changes          Quality of Life Scores:  Quality of Life - 05/18/24 1518       Quality of Life   Select Quality of Life      Quality of Life Scores   Health/Function Pre 25.92 %    Socioeconomic Pre 28.5 %    Psych/Spiritual Pre 28.79 %    Family Pre 30 %    GLOBAL Pre 27.54 %         Scores of 19 and below usually indicate a poorer quality of life in these areas.  A difference of  2-3 points is a clinically meaningful difference.  A difference of 2-3 points in the total score of the Quality of Life Index has been associated with significant improvement in overall quality of life, self-image, physical symptoms, and general health in studies assessing change in quality of life.  PHQ-9: Review Flowsheet       05/18/2024  01/06/2016 12/26/2015  Depression screen PHQ 2/9  Decreased Interest 0 0 0  Down, Depressed, Hopeless 0 0 0  PHQ - 2 Score 0 0 0  Altered sleeping 0 - -  Tired, decreased energy 2 - -  Change in appetite 0 - -  Feeling bad or failure about yourself  0 - -  Trouble concentrating 0 - -  Moving slowly or fidgety/restless 0 - -  Suicidal thoughts 0 - -  PHQ-9 Score 2 - -  Difficult doing work/chores Not difficult at all - -   Interpretation of Total Score  Total Score Depression Severity:  1-4 = Minimal depression, 5-9 = Mild depression, 10-14 = Moderate depression, 15-19 = Moderately severe depression, 20-27 = Severe depression   Psychosocial Evaluation and Intervention:   Psychosocial Re-Evaluation:   Psychosocial Discharge (Final Psychosocial Re-Evaluation):   Vocational Rehabilitation: Provide vocational rehab assistance to qualifying candidates.   Vocational Rehab Evaluation & Intervention:  Vocational Rehab - 05/18/24 1523       Initial Vocational Rehab Evaluation & Intervention   Assessment shows need for Vocational Rehabilitation No  Patient is abck at work. No needs at this time         Education: Education Goals: Education classes will be provided on a weekly basis, covering required topics. Participant will state understanding/return demonstration of topics presented.     Core Videos: Exercise    Move It!  Clinical staff conducted group or individual video education with verbal and written material and guidebook.  Patient learns the recommended Pritikin exercise program. Exercise with the goal of living a long, healthy life. Some of the health benefits of exercise include controlled diabetes, healthier blood pressure levels, improved cholesterol levels, improved heart and lung capacity, improved sleep, and better body composition. Everyone should speak with their doctor before starting or changing an exercise routine.  Biomechanical Limitations Clinical  staff conducted group or individual video education with verbal and written material and guidebook.  Patient learns how biomechanical limitations can impact exercise and how we can mitigate and possibly overcome limitations to have an impactful and balanced exercise routine.  Body Composition Clinical staff conducted group or individual video education with verbal and written material and guidebook.  Patient learns that body composition (ratio of muscle mass to fat mass) is a key component to assessing overall fitness, rather than body weight alone. Increased fat mass, especially visceral belly fat, can put us  at increased risk for metabolic syndrome, type 2 diabetes, heart disease, and even death. It is recommended to combine diet and exercise (cardiovascular and resistance training) to improve your body composition. Seek guidance from your physician and exercise physiologist before implementing an exercise routine.  Exercise Action Plan Clinical staff conducted group or individual video education with verbal and written material and guidebook.  Patient learns the recommended strategies to achieve and enjoy long-term exercise adherence, including variety, self-motivation, self-efficacy, and positive decision making. Benefits of exercise include fitness, good health, weight management, more energy, better sleep, less stress, and overall well-being.  Medical   Heart Disease Risk Reduction Clinical staff conducted group or individual video education with verbal and written material and guidebook.  Patient learns our heart is our most vital organ as it circulates oxygen, nutrients, white blood cells, and hormones throughout the entire body, and carries waste away. Data supports a plant-based eating plan like the Pritikin Program for its effectiveness in slowing progression of and reversing heart disease. The video provides a number of recommendations to address heart disease.   Metabolic Syndrome and  Belly Fat  Clinical staff conducted group or individual video education with verbal and written material and guidebook.  Patient learns what metabolic syndrome is, how it leads to heart disease, and how one can reverse it and keep it from coming back. You have metabolic syndrome if you have 3 of the following 5 criteria: abdominal obesity, high blood pressure, high triglycerides, low HDL cholesterol, and high blood sugar.  Hypertension and Heart Disease Clinical staff conducted group or individual video education with verbal and written material and guidebook.  Patient learns that high blood pressure, or hypertension, is very common in the [place] . Hypertension is largely due to excessive salt intake, but other important risk factors include being overweight, physical inactivity, drinking too much alcohol, smoking, and not eating enough potassium from fruits and vegetables. High blood pressure is a leading risk factor for heart attack, stroke, congestive heart failure, dementia, kidney failure, and premature death. Long-term effects of excessive salt intake include stiffening of the arteries and thickening of heart muscle and organ damage. Recommendations include ways to  reduce hypertension and the risk of heart disease.  Diseases of Our Time - Focusing on Diabetes Clinical staff conducted group or individual video education with verbal and written material and guidebook.  Patient learns why the best way to stop diseases of our time is prevention, through food and other lifestyle changes. Medicine (such as prescription pills and surgeries) is often only a Band-Aid on the problem, not a long-term solution. Most common diseases of our time include obesity, type 2 diabetes, hypertension, heart disease, and cancer. The Pritikin Program is recommended and has been proven to help reduce, reverse, and/or prevent the damaging effects of metabolic syndrome.  Nutrition   Overview of the Pritikin Eating Plan   Clinical staff conducted group or individual video education with verbal and written material and guidebook.  Patient learns about the Pritikin Eating Plan for disease risk reduction. The Pritikin Eating Plan emphasizes a wide variety of unrefined, minimally-processed carbohydrates, like fruits, vegetables, whole grains, and legumes. Go, Caution, and Stop food choices are explained. Plant-based and lean animal proteins are emphasized. Rationale provided for low sodium intake for blood pressure control, low added sugars for blood sugar stabilization, and low added fats and oils for coronary artery disease risk reduction and weight management.  Calorie Density  Clinical staff conducted group or individual video education with verbal and written material and guidebook.  Patient learns about calorie density and how it impacts the Pritikin Eating Plan. Knowing the characteristics of the food you choose will help you decide whether those foods will lead to weight gain or weight loss, and whether you want to consume more or less of them. Weight loss is usually a side effect of the Pritikin Eating Plan because of its focus on low calorie-dense foods.  Label Reading  Clinical staff conducted group or individual video education with verbal and written material and guidebook.  Patient learns about the Pritikin recommended label reading guidelines and corresponding recommendations regarding calorie density, added sugars, sodium content, and whole grains.  Dining Out - Part 1  Clinical staff conducted group or individual video education with verbal and written material and guidebook.  Patient learns that restaurant meals can be sabotaging because they can be so high in calories, fat, sodium, and/or sugar. Patient learns recommended strategies on how to positively address this and avoid unhealthy pitfalls.  Facts on Fats  Clinical staff conducted group or individual video education with verbal and written  material and guidebook.  Patient learns that lifestyle modifications can be just as effective, if not more so, as many medications for lowering your risk of heart disease. A Pritikin lifestyle can help to reduce your risk of inflammation and atherosclerosis (cholesterol build-up, or plaque, in the artery walls). Lifestyle interventions such as dietary choices and physical activity address the cause of atherosclerosis. A review of the types of fats and their impact on blood cholesterol levels, along with dietary recommendations to reduce fat intake is also included.  Nutrition Action Plan  Clinical staff conducted group or individual video education with verbal and written material and guidebook.  Patient learns how to incorporate Pritikin recommendations into their lifestyle. Recommendations include planning and keeping personal health goals in mind as an important part of their success.  Healthy Mind-Set    Healthy Minds, Bodies, Hearts  Clinical staff conducted group or individual video education with verbal and written material and guidebook.  Patient learns how to identify when they are stressed. Video will discuss the impact of that stress, as well  as the many benefits of stress management. Patient will also be introduced to stress management techniques. The way we think, act, and feel has an impact on our hearts.  How Our Thoughts Can Heal Our Hearts  Clinical staff conducted group or individual video education with verbal and written material and guidebook.  Patient learns that negative thoughts can cause depression and anxiety. This can result in negative lifestyle behavior and serious health problems. Cognitive behavioral therapy is an effective method to help control our thoughts in order to change and improve our emotional outlook.  Additional Videos:  Exercise    Improving Performance  Clinical staff conducted group or individual video education with verbal and written material and  guidebook.  Patient learns to use a non-linear approach by alternating intensity levels and lengths of time spent exercising to help burn more calories and lose more body fat. Cardiovascular exercise helps improve heart health, metabolism, hormonal balance, blood sugar control, and recovery from fatigue. Resistance training improves strength, endurance, balance, coordination, reaction time, metabolism, and muscle mass. Flexibility exercise improves circulation, posture, and balance. Seek guidance from your physician and exercise physiologist before implementing an exercise routine and learn your capabilities and proper form for all exercise.  Introduction to Yoga  Clinical staff conducted group or individual video education with verbal and written material and guidebook.  Patient learns about yoga, a discipline of the coming together of mind, breath, and body. The benefits of yoga include improved flexibility, improved range of motion, better posture and core strength, increased lung function, weight loss, and positive self-image. Yoga's heart health benefits include lowered blood pressure, healthier heart rate, decreased cholesterol and triglyceride levels, improved immune function, and reduced stress. Seek guidance from your physician and exercise physiologist before implementing an exercise routine and learn your capabilities and proper form for all exercise.  Medical   Aging: Enhancing Your Quality of Life  Clinical staff conducted group or individual video education with verbal and written material and guidebook.  Patient learns key strategies and recommendations to stay in good physical health and enhance quality of life, such as prevention strategies, having an advocate, securing a Health Care Proxy and Power of Attorney, and keeping a list of medications and system for tracking them. It also discusses how to avoid risk for bone loss.  Biology of Weight Control  Clinical staff conducted group or  individual video education with verbal and written material and guidebook.  Patient learns that weight gain occurs because we consume more calories than we burn (eating more, moving less). Even if your body weight is normal, you may have higher ratios of fat compared to muscle mass. Too much body fat puts you at increased risk for cardiovascular disease, heart attack, stroke, type 2 diabetes, and obesity-related cancers. In addition to exercise, following the Pritikin Eating Plan can help reduce your risk.  Decoding Lab Results  Clinical staff conducted group or individual video education with verbal and written material and guidebook.  Patient learns that lab test reflects one measurement whose values change over time and are influenced by many factors, including medication, stress, sleep, exercise, food, hydration, pre-existing medical conditions, and more. It is recommended to use the knowledge from this video to become more involved with your lab results and evaluate your numbers to speak with your doctor.   Diseases of Our Time - Overview  Clinical staff conducted group or individual video education with verbal and written material and guidebook.  Patient learns that according to the  CDC, 50% to 70% of chronic diseases (such as obesity, type 2 diabetes, elevated lipids, hypertension, and heart disease) are avoidable through lifestyle improvements including healthier food choices, listening to satiety cues, and increased physical activity.  Sleep Disorders Clinical staff conducted group or individual video education with verbal and written material and guidebook.  Patient learns how good quality and duration of sleep are important to overall health and well-being. Patient also learns about sleep disorders and how they impact health along with recommendations to address them, including discussing with a physician.  Nutrition  Dining Out - Part 2 Clinical staff conducted group or individual video  education with verbal and written material and guidebook.  Patient learns how to plan ahead and communicate in order to maximize their dining experience in a healthy and nutritious manner. Included are recommended food choices based on the type of restaurant the patient is visiting.   Fueling a Banker conducted group or individual video education with verbal and written material and guidebook.  There is a strong connection between our food choices and our health. Diseases like obesity and type 2 diabetes are very prevalent and are in large-part due to lifestyle choices. The Pritikin Eating Plan provides plenty of food and hunger-curbing satisfaction. It is easy to follow, affordable, and helps reduce health risks.  Menu Workshop  Clinical staff conducted group or individual video education with verbal and written material and guidebook.  Patient learns that restaurant meals can sabotage health goals because they are often packed with calories, fat, sodium, and sugar. Recommendations include strategies to plan ahead and to communicate with the manager, chef, or server to help order a healthier meal.  Planning Your Eating Strategy  Clinical staff conducted group or individual video education with verbal and written material and guidebook.  Patient learns about the Pritikin Eating Plan and its benefit of reducing the risk of disease. The Pritikin Eating Plan does not focus on calories. Instead, it emphasizes high-quality, nutrient-rich foods. By knowing the characteristics of the foods, we choose, we can determine their calorie density and make informed decisions.  Targeting Your Nutrition Priorities  Clinical staff conducted group or individual video education with verbal and written material and guidebook.  Patient learns that lifestyle habits have a tremendous impact on disease risk and progression. This video provides eating and physical activity recommendations based on your  personal health goals, such as reducing LDL cholesterol, losing weight, preventing or controlling type 2 diabetes, and reducing high blood pressure.  Vitamins and Minerals  Clinical staff conducted group or individual video education with verbal and written material and guidebook.  Patient learns different ways to obtain key vitamins and minerals, including through a recommended healthy diet. It is important to discuss all supplements you take with your doctor.   Healthy Mind-Set    Smoking Cessation  Clinical staff conducted group or individual video education with verbal and written material and guidebook.  Patient learns that cigarette smoking and tobacco addiction pose a serious health risk which affects millions of people. Stopping smoking will significantly reduce the risk of heart disease, lung disease, and many forms of cancer. Recommended strategies for quitting are covered, including working with your doctor to develop a successful plan.  Culinary   Becoming a Set designer conducted group or individual video education with verbal and written material and guidebook.  Patient learns that cooking at home can be healthy, cost-effective, quick, and puts them in control. Keys to  cooking healthy recipes will include looking at your recipe, assessing your equipment needs, planning ahead, making it simple, choosing cost-effective seasonal ingredients, and limiting the use of added fats, salts, and sugars.  Cooking - Breakfast and Snacks  Clinical staff conducted group or individual video education with verbal and written material and guidebook.  Patient learns how important breakfast is to satiety and nutrition through the entire day. Recommendations include key foods to eat during breakfast to help stabilize blood sugar levels and to prevent overeating at meals later in the day. Planning ahead is also a key component.  Cooking - Educational psychologist conducted  group or individual video education with verbal and written material and guidebook.  Patient learns eating strategies to improve overall health, including an approach to cook more at home. Recommendations include thinking of animal protein as a side on your plate rather than center stage and focusing instead on lower calorie dense options like vegetables, fruits, whole grains, and plant-based proteins, such as beans. Making sauces in large quantities to freeze for later and leaving the skin on your vegetables are also recommended to maximize your experience.  Cooking - Healthy Salads and Dressing Clinical staff conducted group or individual video education with verbal and written material and guidebook.  Patient learns that vegetables, fruits, whole grains, and legumes are the foundations of the Pritikin Eating Plan. Recommendations include how to incorporate each of these in flavorful and healthy salads, and how to create homemade salad dressings. Proper handling of ingredients is also covered. Cooking - Soups and [employer] - Soups and Desserts Clinical staff conducted group or individual video education with verbal and written material and guidebook.  Patient learns that Pritikin soups and desserts make for easy, nutritious, and delicious snacks and meal components that are low in sodium, fat, sugar, and calorie density, while high in vitamins, minerals, and filling fiber. Recommendations include simple and healthy ideas for soups and desserts.   Overview     The Pritikin Solution Program Overview Clinical staff conducted group or individual video education with verbal and written material and guidebook.  Patient learns that the results of the Pritikin Program have been documented in more than 100 articles published in peer-reviewed journals, and the benefits include reducing risk factors for (and, in some cases, even reversing) high cholesterol, high blood pressure, type 2 diabetes, obesity,  and more! An overview of the three key pillars of the Pritikin Program will be covered: eating well, doing regular exercise, and having a healthy mind-set.  WORKSHOPS  Exercise: Exercise Basics: Building Your Action Plan Clinical staff led group instruction and group discussion with PowerPoint presentation and patient guidebook. To enhance the learning environment the use of posters, models and videos may be added. At the conclusion of this workshop, patients will comprehend the difference between physical activity and exercise, as well as the benefits of incorporating both, into their routine. Patients will understand the FITT (Frequency, Intensity, Time, and Type) principle and how to use it to build an exercise action plan. In addition, safety concerns and other considerations for exercise and cardiac rehab will be addressed by the presenter. The purpose of this lesson is to promote a comprehensive and effective weekly exercise routine in order to improve patients' overall level of fitness.   Managing Heart Disease: Your Path to a Healthier Heart Clinical staff led group instruction and group discussion with PowerPoint presentation and patient guidebook. To enhance the learning environment the use of posters,  models and videos may be added.At the conclusion of this workshop, patients will understand the anatomy and physiology of the heart. Additionally, they will understand how Pritikin's three pillars impact the risk factors, the progression, and the management of heart disease.  The purpose of this lesson is to provide a high-level overview of the heart, heart disease, and how the Pritikin lifestyle positively impacts risk factors.  Exercise Biomechanics Clinical staff led group instruction and group discussion with PowerPoint presentation and patient guidebook. To enhance the learning environment the use of posters, models and videos may be added. Patients will learn how the structural  parts of their bodies function and how these functions impact their daily activities, movement, and exercise. Patients will learn how to promote a neutral spine, learn how to manage pain, and identify ways to improve their physical movement in order to promote healthy living. The purpose of this lesson is to expose patients to common physical limitations that impact physical activity. Participants will learn practical ways to adapt and manage aches and pains, and to minimize their effect on regular exercise. Patients will learn how to maintain good posture while sitting, walking, and lifting.  Balance Training and Fall Prevention  Clinical staff led group instruction and group discussion with PowerPoint presentation and patient guidebook. To enhance the learning environment the use of posters, models and videos may be added. At the conclusion of this workshop, patients will understand the importance of their sensorimotor skills (vision, proprioception, and the vestibular system) in maintaining their ability to balance as they age. Patients will apply a variety of balancing exercises that are appropriate for their current level of function. Patients will understand the common causes for poor balance, possible solutions to these problems, and ways to modify their physical environment in order to minimize their fall risk. The purpose of this lesson is to teach patients about the importance of maintaining balance as they age and ways to minimize their risk of falling.  WORKSHOPS   Nutrition:  Fueling a Ship broker led group instruction and group discussion with PowerPoint presentation and patient guidebook. To enhance the learning environment the use of posters, models and videos may be added. Patients will review the foundational principles of the Pritikin Eating Plan and understand what constitutes a serving size in each of the food groups. Patients will also learn Pritikin-friendly  foods that are better choices when away from home and review make-ahead meal and snack options. Calorie density will be reviewed and applied to three nutrition priorities: weight maintenance, weight loss, and weight gain. The purpose of this lesson is to reinforce (in a group setting) the key concepts around what patients are recommended to eat and how to apply these guidelines when away from home by planning and selecting Pritikin-friendly options. Patients will understand how calorie density may be adjusted for different weight management goals.  Mindful Eating  Clinical staff led group instruction and group discussion with PowerPoint presentation and patient guidebook. To enhance the learning environment the use of posters, models and videos may be added. Patients will briefly review the concepts of the Pritikin Eating Plan and the importance of low-calorie dense foods. The concept of mindful eating will be introduced as well as the importance of paying attention to internal hunger signals. Triggers for non-hunger eating and techniques for dealing with triggers will be explored. The purpose of this lesson is to provide patients with the opportunity to review the basic principles of the Pritikin Eating Plan, discuss the  value of eating mindfully and how to measure internal cues of hunger and fullness using the Hunger Scale. Patients will also discuss reasons for non-hunger eating and learn strategies to use for controlling emotional eating.  Targeting Your Nutrition Priorities Clinical staff led group instruction and group discussion with PowerPoint presentation and patient guidebook. To enhance the learning environment the use of posters, models and videos may be added. Patients will learn how to determine their genetic susceptibility to disease by reviewing their family history. Patients will gain insight into the importance of diet as part of an overall healthy lifestyle in mitigating the impact of  genetics and other environmental insults. The purpose of this lesson is to provide patients with the opportunity to assess their personal nutrition priorities by looking at their family history, their own health history and current risk factors. Patients will also be able to discuss ways of prioritizing and modifying the Pritikin Eating Plan for their highest risk areas  Menu  Clinical staff led group instruction and group discussion with PowerPoint presentation and patient guidebook. To enhance the learning environment the use of posters, models and videos may be added. Using menus brought in from [employer], or printed from [employer], patients will apply the Pritikin dining out guidelines that were presented in the [employer] video. Patients will also be able to practice these guidelines in a variety of provided scenarios. The purpose of this lesson is to provide patients with the opportunity to practice hands-on learning of the Pritikin Dining Out guidelines with actual menus and practice scenarios.  Label Reading Clinical staff led group instruction and group discussion with PowerPoint presentation and patient guidebook. To enhance the learning environment the use of posters, models and videos may be added. Patients will review and discuss the Pritikin label reading guidelines presented in Pritikin's Label Reading Educational series video. Using fool labels brought in from local grocery stores and markets, patients will apply the label reading guidelines and determine if the packaged food meet the Pritikin guidelines. The purpose of this lesson is to provide patients with the opportunity to review, discuss, and practice hands-on learning of the Pritikin Label Reading guidelines with actual packaged food labels. Cooking School  Pritikin's [employer] are designed to teach patients ways to prepare quick, simple, and affordable recipes at home. The importance of  nutrition's role in chronic disease risk reduction is reflected in its emphasis in the overall Pritikin program. By learning how to prepare essential core Pritikin Eating Plan recipes, patients will increase control over what they eat; be able to customize the flavor of foods without the use of added salt, sugar, or fat; and improve the quality of the food they consume. By learning a set of core recipes which are easily assembled, quickly prepared, and affordable, patients are more likely to prepare more healthy foods at home. These workshops focus on convenient breakfasts, simple entres, side dishes, and desserts which can be prepared with minimal effort and are consistent with nutrition recommendations for cardiovascular risk reduction. Cooking [employer] are taught by a Armed forces logistics/support/administrative officer (RD) who has been trained by the [employer]. The chef or RD has a clear understanding of the importance of minimizing - if not completely eliminating - added fat, sugar, and sodium in recipes. Throughout the series of Cooking School Workshop sessions, patients will learn about healthy ingredients and efficient methods of cooking to build confidence in their capability to prepare    Cooking  School weekly topics:  Adding Flavor- Sodium-Free  Fast and Healthy Breakfasts  Powerhouse Plant-Based Proteins  Satisfying Salads and Dressings  Simple Sides and Sauces  International Cuisine-Spotlight on the Blue Zones  Delicious Desserts  Savory Soups  Efficiency Cooking - Meals in a Snap  Tasty Appetizers and Snacks  Comforting Weekend Breakfasts  One-Pot Wonders   Fast Evening Meals  Landscape architect Your Pritikin Plate  WORKSHOPS   Healthy Mindset (Psychosocial):  Focused Goals, Sustainable Changes Clinical staff led group instruction and group discussion with PowerPoint presentation and patient guidebook. To enhance the learning environment the use of posters,  models and videos may be added. Patients will be able to apply effective goal setting strategies to establish at least one personal goal, and then take consistent, meaningful action toward that goal. They will learn to identify common barriers to achieving personal goals and develop strategies to overcome them. Patients will also gain an understanding of how our mind-set can impact our ability to achieve goals and the importance of cultivating a positive and growth-oriented mind-set. The purpose of this lesson is to provide patients with a deeper understanding of how to set and achieve personal goals, as well as the tools and strategies needed to overcome common obstacles which may arise along the way.  From Head to Heart: The Power of a Healthy Outlook  Clinical staff led group instruction and group discussion with PowerPoint presentation and patient guidebook. To enhance the learning environment the use of posters, models and videos may be added. Patients will be able to recognize and describe the impact of emotions and mood on physical health. They will discover the importance of self-care and explore self-care practices which may work for them. Patients will also learn how to utilize the 4 C's to cultivate a healthier outlook and better manage stress and challenges. The purpose of this lesson is to demonstrate to patients how a healthy outlook is an essential part of maintaining good health, especially as they continue their cardiac rehab journey.  Healthy Sleep for a Healthy Heart Clinical staff led group instruction and group discussion with PowerPoint presentation and patient guidebook. To enhance the learning environment the use of posters, models and videos may be added. At the conclusion of this workshop, patients will be able to demonstrate knowledge of the importance of sleep to overall health, well-being, and quality of life. They will understand the symptoms of, and treatments for, common sleep  disorders. Patients will also be able to identify daytime and nighttime behaviors which impact sleep, and they will be able to apply these tools to help manage sleep-related challenges. The purpose of this lesson is to provide patients with a general overview of sleep and outline the importance of quality sleep. Patients will learn about a few of the most common sleep disorders. Patients will also be introduced to the concept of "sleep hygiene," and discover ways to self-manage certain sleeping problems through simple daily behavior changes. Finally, the workshop will motivate patients by clarifying the links between quality sleep and their goals of heart-healthy living.   Recognizing and Reducing Stress Clinical staff led group instruction and group discussion with PowerPoint presentation and patient guidebook. To enhance the learning environment the use of posters, models and videos may be added. At the conclusion of this workshop, patients will be able to understand the types of stress reactions, differentiate between acute and chronic stress, and recognize the impact that chronic stress has on their health. They will also  be able to apply different coping mechanisms, such as reframing negative self-talk. Patients will have the opportunity to practice a variety of stress management techniques, such as deep abdominal breathing, progressive muscle relaxation, and/or guided imagery.  The purpose of this lesson is to educate patients on the role of stress in their lives and to provide healthy techniques for coping with it.  Learning Barriers/Preferences:  Learning Barriers/Preferences - 05/18/24 1519       Learning Barriers/Preferences   Learning Barriers Exercise Concerns   Prior fall, 11 seconds in single leg stand   Learning Preferences Written Material;Pictoral;Individual Instruction;Group Instruction          Education Topics:  Knowledge Questionnaire Score:  Knowledge Questionnaire Score -  05/18/24 1521       Knowledge Questionnaire Score   Pre Score 23/24          Core Components/Risk Factors/Patient Goals at Admission:  Personal Goals and Risk Factors at Admission - 05/18/24 1524       Core Components/Risk Factors/Patient Goals on Admission   Heart Failure Yes    Intervention Provide a combined exercise and nutrition program that is supplemented with education, support and counseling about heart failure. Directed toward relieving symptoms such as shortness of breath, decreased exercise tolerance, and extremity edema.    Expected Outcomes Improve functional capacity of life;Short term: Attendance in program 2-3 days a week with increased exercise capacity. Reported lower sodium intake. Reported increased fruit and vegetable intake. Reports medication compliance.;Short term: Daily weights obtained and reported for increase. Utilizing diuretic protocols set by physician.;Long term: Adoption of self-care skills and reduction of barriers for early signs and symptoms recognition and intervention leading to self-care maintenance.    Hypertension Yes    Intervention Provide education on lifestyle modifcations including regular physical activity/exercise, weight management, moderate sodium restriction and increased consumption of fresh fruit, vegetables, and low fat dairy, alcohol moderation, and smoking cessation.;Monitor prescription use compliance.    Expected Outcomes Short Term: Continued assessment and intervention until BP is < 140/74mm HG in hypertensive participants. < 130/25mm HG in hypertensive participants with diabetes, heart failure or chronic kidney disease.;Long Term: Maintenance of blood pressure at goal levels.    Lipids Yes    Intervention Provide education and support for participant on nutrition & aerobic/resistive exercise along with prescribed medications to achieve LDL 70mg , HDL >40mg .    Expected Outcomes Short Term: Participant states understanding of desired  cholesterol values and is compliant with medications prescribed. Participant is following exercise prescription and nutrition guidelines.;Long Term: Cholesterol controlled with medications as prescribed, with individualized exercise RX and with personalized nutrition plan. Value goals: LDL < 70mg , HDL > 40 mg.    Stress Yes    Intervention Offer individual and/or small group education and counseling on adjustment to heart disease, stress management and health-related lifestyle change. Teach and support self-help strategies.;Refer participants experiencing significant psychosocial distress to appropriate mental health specialists for further evaluation and treatment. When possible, include family members and significant others in education/counseling sessions.    Expected Outcomes Short Term: Participant demonstrates changes in health-related behavior, relaxation and other stress management skills, ability to obtain effective social support, and compliance with psychotropic medications if prescribed.;Long Term: Emotional wellbeing is indicated by absence of clinically significant psychosocial distress or social isolation.    Personal Goal Other Yes    Personal Goal Goals: Incease strength and endurance, get back to the way she felt before, less fatigue    Intervention Will continue to monitor  pt and progress workloads  as tolerated without sign or symptom    Expected Outcomes Pt will achieve her goals and increase strength          Core Components/Risk Factors/Patient Goals Review:    Core Components/Risk Factors/Patient Goals at Discharge (Final Review):    ITP Comments:  ITP Comments     Row Name 05/18/24 1500           ITP Comments Dr. Wilbert Bihari medical director. Introduction to pritikin education program/intensive cardiac rehab. Initial orientation packet reviewed with patient          Comments: Participant attended orientation for the cardiac rehabilitation program on  05/18/2024   to perform initial intake and exercise walk test. Patient introduced to the [facility] education and orientation packet was reviewed. Completed 6-minute walk test, measurements, initial ITP, and exercise prescription. Vital signs stable. Telemetry-normal sinus rhythm, asymptomatic. Patient does have chronic right hip pain but pain resolves with rest.  She is also very motivated to QUIT smoking. Gave her several resources and talked about alternate habits to form instead of smoking. She states she has quit before and she's ready to quit now. Will continue to follow her progress.    Service time was from 1255 to 1445.

## 2024-05-18 NOTE — Progress Notes (Signed)
 Cardiac Rehab Medication Review   Does the patient  feel that his/her medications are working for him/her?  yes  Has the patient been experiencing any side effects to the medications prescribed?  no  Does the patient measure his/her own blood pressure or blood glucose at home?  yes   Does the patient have any problems obtaining medications due to transportation or finances?   no  Understanding of regimen: good Understanding of indications: good Potential of compliance: good    Comments: Patient has a good medication routine and is taking her BP at home 1-2 times a day    Alec GORMAN Finder 05/18/2024 3:30 PM

## 2024-05-18 NOTE — Telephone Encounter (Signed)
 Per Alec EP pt wants to come to cardiac rehab only Mon and Wed and her first day of exercise will be 7/16

## 2024-05-22 ENCOUNTER — Encounter (HOSPITAL_COMMUNITY)

## 2024-05-23 NOTE — Progress Notes (Signed)
 Cardiac Individual Treatment Plan  Patient Details  Name: Gina Harrell MRN: [id number] Date of Birth: 1941-10-21 Referring Provider:   Flowsheet Row INTENSIVE CARDIAC REHAB ORIENT from 05/18/2024 in [facility]  Referring Provider Dr. Jerel Balding, MD    Initial Encounter Date:  Flowsheet Row INTENSIVE CARDIAC REHAB ORIENT from 05/18/2024 in [facility]  Date 05/18/24    Visit Diagnosis: S/P TAVR (transcatheter aortic valve replacement)  Patient's Home Medications on Admission:  Current Outpatient Medications:    albuterol  (VENTOLIN  HFA) 108 (90 Base) MCG/ACT inhaler, Inhale 1 puff into the lungs every 4 (four) hours as needed for shortness of breath., Disp: , Rfl:    ALPRAZolam  (XANAX ) 0.5 MG tablet, Take 0.5 mg by mouth at bedtime. , Disp: , Rfl:    amoxicillin (AMOXIL) 500 MG capsule, Take 2,000 mg by mouth See admin instructions. Take 1 hour prior to dental visit, Disp: , Rfl:    aspirin  81 MG chewable tablet, Chew 1 tablet (81 mg total) by mouth daily., Disp: , Rfl:    Cholecalciferol 50 MCG (2000 UT) CAPS, Take 2,000 Units by mouth daily., Disp: , Rfl:    CRANBERRY EXTRACT PO, Take 2 capsules by mouth daily., Disp: , Rfl:    diphenhydramine-acetaminophen  (TYLENOL  PM) 25-500 MG TABS, Take 2 tablets by mouth at bedtime. Reported on 12/26/2015, Disp: , Rfl:    escitalopram  (LEXAPRO ) 10 MG tablet, Take 5 mg by mouth at bedtime., Disp: , Rfl:    furosemide  (LASIX ) 20 MG tablet, Take 1 tablet (20 mg total) by mouth as needed for fluid or edema., Disp: 60 tablet, Rfl: 11   levothyroxine  (SYNTHROID , LEVOTHROID) 50 MCG tablet, Take 50 mcg by mouth daily before breakfast., Disp: , Rfl:    losartan  (COZAAR ) 25 MG tablet, Take 1 tablet (25 mg total) by mouth daily., Disp: 30 tablet, Rfl: 3   rosuvastatin  (CRESTOR ) 20 MG tablet, Take 20 mg by mouth daily., Disp: , Rfl:    Past Medical History: Past Medical History:  Diagnosis Date   Bladder cancer (HCC)    COPD (chronic obstructive pulmonary disease) (HCC)    Elevated cholesterol    GERD (gastroesophageal reflux disease)    Hypertension    Hypothyroidism    Osteoporosis    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 Wendel and Dr. Shyrl.   Severe aortic stenosis    Uterine prolapse     Tobacco Use: Social History   Tobacco Use  Smoking Status Every Day   Current packs/day: 0.50   Average packs/day: 0.5 packs/day for 50.0 years (25.0 ttl pk-yrs)   Types: Cigarettes  Smokeless Tobacco Never  Tobacco Comments   03/20/2024 Patient smokes 8 cigarettes daily   01/03/2024 Patient states one pack last about three days    Labs: Review Flowsheet       Latest Ref Rng & Units 01/31/2024 04/18/2024  Labs for ITP Cardiac and Pulmonary Rehab  PH, Arterial 7.35 - 7.45 7.340  -  PCO2 arterial 32 - 48 mmHg 36.5  -  Bicarbonate 20.0 - 28.0 mmol/L 18.6  22.5  19.7  -  TCO2 22 - 32 mmol/L 20  24  21  22    Acid-base deficit 0.0 - 2.0 mmol/L 9.0  4.0  5.0  -  O2 Saturation % 55  61  92  -    Details       Multiple values from one day are sorted in reverse-chronological order         Capillary Blood Glucose: Lab Results  Component Value Date   GLUCAP 109 (H) 04/19/2024     Exercise Target Goals: Exercise Program Goal: Individual exercise prescription set using results from initial 6 min walk test and THRR while considering  patient's activity barriers and safety.   Exercise Prescription Goal: Initial exercise prescription builds to 30-45 minutes a day of aerobic activity, 2-3 days per week.  Home exercise guidelines will be given to patient during program as part of exercise prescription that the participant will acknowledge.  Activity Barriers & Risk Stratification:  Activity Barriers & Cardiac Risk  Stratification - 05/18/24 1532       Activity Barriers & Cardiac Risk Stratification   Activity Barriers Arthritis;Deconditioning;Back Problems;Neck/Spine Problems;History of Falls;Balance Concerns;Shortness of Breath    Cardiac Risk Stratification High          6 Minute Walk:  6 Minute Walk     Row Name 05/18/24 1506         6 Minute Walk   Phase Initial     Distance 1560 feet     Walk Time 6 minutes     # of Rest Breaks 0     MPH 2.95     METS 2.67     RPE 11     Perceived Dyspnea  1     VO2 Peak 9.36     Symptoms Yes (comment)     Comments Right hip pain 2/10 resolved with rest     Resting HR 59 bpm     Resting BP 122/80     Max Ex. HR 85 bpm     Max Ex. BP 134/82     2 Minute Post BP 128/78        Oxygen Initial Assessment:   Oxygen Re-Evaluation:   Oxygen Discharge (Final Oxygen Re-Evaluation):   Initial Exercise Prescription:  Initial Exercise Prescription - 05/18/24 1500       Date of Initial Exercise RX and Referring Provider   Date 05/18/24    Referring Provider Dr. Jerel Balding, MD    Expected Discharge Date 08/09/24      NuStep   Level 1    SPM 80    Minutes 15    METs 1.8      Track   Laps 15    Minutes 15    METs 2.2      Prescription Details   Frequency (times per week) 2    Duration Progress to 30 minutes of continuous aerobic without signs/symptoms of physical distress      Intensity   THRR 40-80% of Max Heartrate 56-111    Ratings of Perceived Exertion 11-13    Perceived Dyspnea 0-4      Progression   Progression Continue to progress workloads to maintain intensity without signs/symptoms of physical distress.      Resistance Training   Training Prescription Yes    Weight 2    Reps 10-15          Perform Capillary Blood Glucose checks as needed.  Exercise Prescription Changes:   Exercise Comments:   Exercise Goals and Review:   Exercise Goals     Row Name 05/18/24 1516             Exercise  Goals   Increase Physical Activity Yes  Intervention Provide advice, education, support and counseling about physical activity/exercise needs.;Develop an individualized exercise prescription for aerobic and resistive training based on initial evaluation findings, risk stratification, comorbidities and participant's personal goals.       Expected Outcomes Short Term: Attend rehab on a regular basis to increase amount of physical activity.;Long Term: Exercising regularly at least 3-5 days a week.;Long Term: Add in home exercise to make exercise part of routine and to increase amount of physical activity.       Increase Strength and Stamina Yes       Intervention Provide advice, education, support and counseling about physical activity/exercise needs.;Develop an individualized exercise prescription for aerobic and resistive training based on initial evaluation findings, risk stratification, comorbidities and participant's personal goals.       Expected Outcomes Short Term: Increase workloads from initial exercise prescription for resistance, speed, and METs.;Short Term: Perform resistance training exercises routinely during rehab and add in resistance training at home;Long Term: Improve cardiorespiratory fitness, muscular endurance and strength as measured by increased METs and functional capacity ( )       Able to understand and use rate of perceived exertion (RPE) scale Yes       Intervention Provide education and explanation on how to use RPE scale       Expected Outcomes Short Term: Able to use RPE daily in rehab to express subjective intensity level;Long Term:  Able to use RPE to guide intensity level when exercising independently       Knowledge and understanding of Target Heart Rate Range (THRR) Yes       Intervention Provide education and explanation of THRR including how the numbers were predicted and where they are located for reference       Expected Outcomes Short Term: Able to  state/look up THRR;Long Term: Able to use THRR to govern intensity when exercising independently;Short Term: Able to use daily as guideline for intensity in rehab       Understanding of Exercise Prescription Yes       Intervention Provide education, explanation, and written materials on patient's individual exercise prescription       Expected Outcomes Short Term: Able to explain program exercise prescription;Long Term: Able to explain home exercise prescription to exercise independently          Exercise Goals Re-Evaluation :   Discharge Exercise Prescription (Final Exercise Prescription Changes):   Nutrition:  Target Goals: Understanding of nutrition guidelines, daily intake of sodium 1500mg , cholesterol 200mg , calories 30% from fat and 7% or less from saturated fats, daily to have 5 or more servings of fruits and vegetables.  Biometrics:  Pre Biometrics - 05/18/24 1501       Pre Biometrics   Waist Circumference 31 inches    Hip Circumference 37.25 inches    Waist to Hip Ratio 0.83 %    Triceps Skinfold 13 mm    % Body Fat 32.7 %    Grip Strength 28 kg    Flexibility 14 in    Single Leg Stand 11 seconds           Nutrition Therapy Plan and Nutrition Goals:   Nutrition Assessments:  MEDIFICTS Score Key: >=70 Need to make dietary changes  40-70 Heart Healthy Diet <= 40 Therapeutic Level Cholesterol Diet    Picture Your Plate Scores: <59 Unhealthy dietary pattern with much room for improvement. 41-50 Dietary pattern unlikely to meet recommendations for good health and room for improvement. 51-60 More healthful dietary pattern, with  some room for improvement.  >60 Healthy dietary pattern, although there may be some specific behaviors that could be improved.    Nutrition Goals Re-Evaluation:   Nutrition Goals Re-Evaluation:   Nutrition Goals Discharge (Final Nutrition Goals Re-Evaluation):   Psychosocial: Target Goals: Acknowledge presence or absence of  significant depression and/or stress, maximize coping skills, provide positive support system. Participant is able to verbalize types and ability to use techniques and skills needed for reducing stress and depression.  Initial Review & Psychosocial Screening:  Initial Psych Review & Screening - 05/18/24 1518       Initial Review   Current issues with Current Anxiety/Panic;Current Stress Concerns    Comments Some work related stress      Family Dynamics   Good Support System? Yes      Barriers   Psychosocial barriers to participate in program The patient should benefit from training in stress management and relaxation.      Screening Interventions   Interventions Encouraged to exercise;To provide support and resources with identified psychosocial needs;Provide feedback about the scores to participant    Expected Outcomes Long Term Goal: Stressors or current issues are controlled or eliminated.;Short Term goal: Identification and review with participant of any Quality of Life or Depression concerns found by scoring the questionnaire.;Long Term goal: The participant improves quality of Life and PHQ9 Scores as seen by post scores and/or verbalization of changes          Quality of Life Scores:  Quality of Life - 05/18/24 1518       Quality of Life   Select Quality of Life      Quality of Life Scores   Health/Function Pre 25.92 %    Socioeconomic Pre 28.5 %    Psych/Spiritual Pre 28.79 %    Family Pre 30 %    GLOBAL Pre 27.54 %         Scores of 19 and below usually indicate a poorer quality of life in these areas.  A difference of  2-3 points is a clinically meaningful difference.  A difference of 2-3 points in the total score of the Quality of Life Index has been associated with significant improvement in overall quality of life, self-image, physical symptoms, and general health in studies assessing change in quality of life.  PHQ-9: Review Flowsheet       05/18/2024  01/06/2016 12/26/2015  Depression screen PHQ 2/9  Decreased Interest 0 0 0  Down, Depressed, Hopeless 0 0 0  PHQ - 2 Score 0 0 0  Altered sleeping 0 - -  Tired, decreased energy 2 - -  Change in appetite 0 - -  Feeling bad or failure about yourself  0 - -  Trouble concentrating 0 - -  Moving slowly or fidgety/restless 0 - -  Suicidal thoughts 0 - -  PHQ-9 Score 2 - -  Difficult doing work/chores Not difficult at all - -   Interpretation of Total Score  Total Score Depression Severity:  1-4 = Minimal depression, 5-9 = Mild depression, 10-14 = Moderate depression, 15-19 = Moderately severe depression, 20-27 = Severe depression   Psychosocial Evaluation and Intervention:   Psychosocial Re-Evaluation:   Psychosocial Discharge (Final Psychosocial Re-Evaluation):   Vocational Rehabilitation: Provide vocational rehab assistance to qualifying candidates.   Vocational Rehab Evaluation & Intervention:  Vocational Rehab - 05/18/24 1523       Initial Vocational Rehab Evaluation & Intervention   Assessment shows need for Vocational Rehabilitation No  Patient is abck at work. No needs at this time         Education: Education Goals: Education classes will be provided on a weekly basis, covering required topics. Participant will state understanding/return demonstration of topics presented.     Core Videos: Exercise    Move It!  Clinical staff conducted group or individual video education with verbal and written material and guidebook.  Patient learns the recommended Pritikin exercise program. Exercise with the goal of living a long, healthy life. Some of the health benefits of exercise include controlled diabetes, healthier blood pressure levels, improved cholesterol levels, improved heart and lung capacity, improved sleep, and better body composition. Everyone should speak with their doctor before starting or changing an exercise routine.  Biomechanical Limitations Clinical  staff conducted group or individual video education with verbal and written material and guidebook.  Patient learns how biomechanical limitations can impact exercise and how we can mitigate and possibly overcome limitations to have an impactful and balanced exercise routine.  Body Composition Clinical staff conducted group or individual video education with verbal and written material and guidebook.  Patient learns that body composition (ratio of muscle mass to fat mass) is a key component to assessing overall fitness, rather than body weight alone. Increased fat mass, especially visceral belly fat, can put us  at increased risk for metabolic syndrome, type 2 diabetes, heart disease, and even death. It is recommended to combine diet and exercise (cardiovascular and resistance training) to improve your body composition. Seek guidance from your physician and exercise physiologist before implementing an exercise routine.  Exercise Action Plan Clinical staff conducted group or individual video education with verbal and written material and guidebook.  Patient learns the recommended strategies to achieve and enjoy long-term exercise adherence, including variety, self-motivation, self-efficacy, and positive decision making. Benefits of exercise include fitness, good health, weight management, more energy, better sleep, less stress, and overall well-being.  Medical   Heart Disease Risk Reduction Clinical staff conducted group or individual video education with verbal and written material and guidebook.  Patient learns our heart is our most vital organ as it circulates oxygen, nutrients, white blood cells, and hormones throughout the entire body, and carries waste away. Data supports a plant-based eating plan like the Pritikin Program for its effectiveness in slowing progression of and reversing heart disease. The video provides a number of recommendations to address heart disease.   Metabolic Syndrome and  Belly Fat  Clinical staff conducted group or individual video education with verbal and written material and guidebook.  Patient learns what metabolic syndrome is, how it leads to heart disease, and how one can reverse it and keep it from coming back. You have metabolic syndrome if you have 3 of the following 5 criteria: abdominal obesity, high blood pressure, high triglycerides, low HDL cholesterol, and high blood sugar.  Hypertension and Heart Disease Clinical staff conducted group or individual video education with verbal and written material and guidebook.  Patient learns that high blood pressure, or hypertension, is very common in the [place] . Hypertension is largely due to excessive salt intake, but other important risk factors include being overweight, physical inactivity, drinking too much alcohol, smoking, and not eating enough potassium from fruits and vegetables. High blood pressure is a leading risk factor for heart attack, stroke, congestive heart failure, dementia, kidney failure, and premature death. Long-term effects of excessive salt intake include stiffening of the arteries and thickening of heart muscle and organ damage. Recommendations include ways to  reduce hypertension and the risk of heart disease.  Diseases of Our Time - Focusing on Diabetes Clinical staff conducted group or individual video education with verbal and written material and guidebook.  Patient learns why the best way to stop diseases of our time is prevention, through food and other lifestyle changes. Medicine (such as prescription pills and surgeries) is often only a Band-Aid on the problem, not a long-term solution. Most common diseases of our time include obesity, type 2 diabetes, hypertension, heart disease, and cancer. The Pritikin Program is recommended and has been proven to help reduce, reverse, and/or prevent the damaging effects of metabolic syndrome.  Nutrition   Overview of the Pritikin Eating Plan   Clinical staff conducted group or individual video education with verbal and written material and guidebook.  Patient learns about the Pritikin Eating Plan for disease risk reduction. The Pritikin Eating Plan emphasizes a wide variety of unrefined, minimally-processed carbohydrates, like fruits, vegetables, whole grains, and legumes. Go, Caution, and Stop food choices are explained. Plant-based and lean animal proteins are emphasized. Rationale provided for low sodium intake for blood pressure control, low added sugars for blood sugar stabilization, and low added fats and oils for coronary artery disease risk reduction and weight management.  Calorie Density  Clinical staff conducted group or individual video education with verbal and written material and guidebook.  Patient learns about calorie density and how it impacts the Pritikin Eating Plan. Knowing the characteristics of the food you choose will help you decide whether those foods will lead to weight gain or weight loss, and whether you want to consume more or less of them. Weight loss is usually a side effect of the Pritikin Eating Plan because of its focus on low calorie-dense foods.  Label Reading  Clinical staff conducted group or individual video education with verbal and written material and guidebook.  Patient learns about the Pritikin recommended label reading guidelines and corresponding recommendations regarding calorie density, added sugars, sodium content, and whole grains.  Dining Out - Part 1  Clinical staff conducted group or individual video education with verbal and written material and guidebook.  Patient learns that restaurant meals can be sabotaging because they can be so high in calories, fat, sodium, and/or sugar. Patient learns recommended strategies on how to positively address this and avoid unhealthy pitfalls.  Facts on Fats  Clinical staff conducted group or individual video education with verbal and written  material and guidebook.  Patient learns that lifestyle modifications can be just as effective, if not more so, as many medications for lowering your risk of heart disease. A Pritikin lifestyle can help to reduce your risk of inflammation and atherosclerosis (cholesterol build-up, or plaque, in the artery walls). Lifestyle interventions such as dietary choices and physical activity address the cause of atherosclerosis. A review of the types of fats and their impact on blood cholesterol levels, along with dietary recommendations to reduce fat intake is also included.  Nutrition Action Plan  Clinical staff conducted group or individual video education with verbal and written material and guidebook.  Patient learns how to incorporate Pritikin recommendations into their lifestyle. Recommendations include planning and keeping personal health goals in mind as an important part of their success.  Healthy Mind-Set    Healthy Minds, Bodies, Hearts  Clinical staff conducted group or individual video education with verbal and written material and guidebook.  Patient learns how to identify when they are stressed. Video will discuss the impact of that stress, as well  as the many benefits of stress management. Patient will also be introduced to stress management techniques. The way we think, act, and feel has an impact on our hearts.  How Our Thoughts Can Heal Our Hearts  Clinical staff conducted group or individual video education with verbal and written material and guidebook.  Patient learns that negative thoughts can cause depression and anxiety. This can result in negative lifestyle behavior and serious health problems. Cognitive behavioral therapy is an effective method to help control our thoughts in order to change and improve our emotional outlook.  Additional Videos:  Exercise    Improving Performance  Clinical staff conducted group or individual video education with verbal and written material and  guidebook.  Patient learns to use a non-linear approach by alternating intensity levels and lengths of time spent exercising to help burn more calories and lose more body fat. Cardiovascular exercise helps improve heart health, metabolism, hormonal balance, blood sugar control, and recovery from fatigue. Resistance training improves strength, endurance, balance, coordination, reaction time, metabolism, and muscle mass. Flexibility exercise improves circulation, posture, and balance. Seek guidance from your physician and exercise physiologist before implementing an exercise routine and learn your capabilities and proper form for all exercise.  Introduction to Yoga  Clinical staff conducted group or individual video education with verbal and written material and guidebook.  Patient learns about yoga, a discipline of the coming together of mind, breath, and body. The benefits of yoga include improved flexibility, improved range of motion, better posture and core strength, increased lung function, weight loss, and positive self-image. Yoga's heart health benefits include lowered blood pressure, healthier heart rate, decreased cholesterol and triglyceride levels, improved immune function, and reduced stress. Seek guidance from your physician and exercise physiologist before implementing an exercise routine and learn your capabilities and proper form for all exercise.  Medical   Aging: Enhancing Your Quality of Life  Clinical staff conducted group or individual video education with verbal and written material and guidebook.  Patient learns key strategies and recommendations to stay in good physical health and enhance quality of life, such as prevention strategies, having an advocate, securing a Health Care Proxy and Power of Attorney, and keeping a list of medications and system for tracking them. It also discusses how to avoid risk for bone loss.  Biology of Weight Control  Clinical staff conducted group or  individual video education with verbal and written material and guidebook.  Patient learns that weight gain occurs because we consume more calories than we burn (eating more, moving less). Even if your body weight is normal, you may have higher ratios of fat compared to muscle mass. Too much body fat puts you at increased risk for cardiovascular disease, heart attack, stroke, type 2 diabetes, and obesity-related cancers. In addition to exercise, following the Pritikin Eating Plan can help reduce your risk.  Decoding Lab Results  Clinical staff conducted group or individual video education with verbal and written material and guidebook.  Patient learns that lab test reflects one measurement whose values change over time and are influenced by many factors, including medication, stress, sleep, exercise, food, hydration, pre-existing medical conditions, and more. It is recommended to use the knowledge from this video to become more involved with your lab results and evaluate your numbers to speak with your doctor.   Diseases of Our Time - Overview  Clinical staff conducted group or individual video education with verbal and written material and guidebook.  Patient learns that according to the  CDC, 50% to 70% of chronic diseases (such as obesity, type 2 diabetes, elevated lipids, hypertension, and heart disease) are avoidable through lifestyle improvements including healthier food choices, listening to satiety cues, and increased physical activity.  Sleep Disorders Clinical staff conducted group or individual video education with verbal and written material and guidebook.  Patient learns how good quality and duration of sleep are important to overall health and well-being. Patient also learns about sleep disorders and how they impact health along with recommendations to address them, including discussing with a physician.  Nutrition  Dining Out - Part 2 Clinical staff conducted group or individual video  education with verbal and written material and guidebook.  Patient learns how to plan ahead and communicate in order to maximize their dining experience in a healthy and nutritious manner. Included are recommended food choices based on the type of restaurant the patient is visiting.   Fueling a Banker conducted group or individual video education with verbal and written material and guidebook.  There is a strong connection between our food choices and our health. Diseases like obesity and type 2 diabetes are very prevalent and are in large-part due to lifestyle choices. The Pritikin Eating Plan provides plenty of food and hunger-curbing satisfaction. It is easy to follow, affordable, and helps reduce health risks.  Menu Workshop  Clinical staff conducted group or individual video education with verbal and written material and guidebook.  Patient learns that restaurant meals can sabotage health goals because they are often packed with calories, fat, sodium, and sugar. Recommendations include strategies to plan ahead and to communicate with the manager, chef, or server to help order a healthier meal.  Planning Your Eating Strategy  Clinical staff conducted group or individual video education with verbal and written material and guidebook.  Patient learns about the Pritikin Eating Plan and its benefit of reducing the risk of disease. The Pritikin Eating Plan does not focus on calories. Instead, it emphasizes high-quality, nutrient-rich foods. By knowing the characteristics of the foods, we choose, we can determine their calorie density and make informed decisions.  Targeting Your Nutrition Priorities  Clinical staff conducted group or individual video education with verbal and written material and guidebook.  Patient learns that lifestyle habits have a tremendous impact on disease risk and progression. This video provides eating and physical activity recommendations based on your  personal health goals, such as reducing LDL cholesterol, losing weight, preventing or controlling type 2 diabetes, and reducing high blood pressure.  Vitamins and Minerals  Clinical staff conducted group or individual video education with verbal and written material and guidebook.  Patient learns different ways to obtain key vitamins and minerals, including through a recommended healthy diet. It is important to discuss all supplements you take with your doctor.   Healthy Mind-Set    Smoking Cessation  Clinical staff conducted group or individual video education with verbal and written material and guidebook.  Patient learns that cigarette smoking and tobacco addiction pose a serious health risk which affects millions of people. Stopping smoking will significantly reduce the risk of heart disease, lung disease, and many forms of cancer. Recommended strategies for quitting are covered, including working with your doctor to develop a successful plan.  Culinary   Becoming a Set designer conducted group or individual video education with verbal and written material and guidebook.  Patient learns that cooking at home can be healthy, cost-effective, quick, and puts them in control. Keys to  cooking healthy recipes will include looking at your recipe, assessing your equipment needs, planning ahead, making it simple, choosing cost-effective seasonal ingredients, and limiting the use of added fats, salts, and sugars.  Cooking - Breakfast and Snacks  Clinical staff conducted group or individual video education with verbal and written material and guidebook.  Patient learns how important breakfast is to satiety and nutrition through the entire day. Recommendations include key foods to eat during breakfast to help stabilize blood sugar levels and to prevent overeating at meals later in the day. Planning ahead is also a key component.  Cooking - Educational psychologist conducted  group or individual video education with verbal and written material and guidebook.  Patient learns eating strategies to improve overall health, including an approach to cook more at home. Recommendations include thinking of animal protein as a side on your plate rather than center stage and focusing instead on lower calorie dense options like vegetables, fruits, whole grains, and plant-based proteins, such as beans. Making sauces in large quantities to freeze for later and leaving the skin on your vegetables are also recommended to maximize your experience.  Cooking - Healthy Salads and Dressing Clinical staff conducted group or individual video education with verbal and written material and guidebook.  Patient learns that vegetables, fruits, whole grains, and legumes are the foundations of the Pritikin Eating Plan. Recommendations include how to incorporate each of these in flavorful and healthy salads, and how to create homemade salad dressings. Proper handling of ingredients is also covered. Cooking - Soups and [employer] - Soups and Desserts Clinical staff conducted group or individual video education with verbal and written material and guidebook.  Patient learns that Pritikin soups and desserts make for easy, nutritious, and delicious snacks and meal components that are low in sodium, fat, sugar, and calorie density, while high in vitamins, minerals, and filling fiber. Recommendations include simple and healthy ideas for soups and desserts.   Overview     The Pritikin Solution Program Overview Clinical staff conducted group or individual video education with verbal and written material and guidebook.  Patient learns that the results of the Pritikin Program have been documented in more than 100 articles published in peer-reviewed journals, and the benefits include reducing risk factors for (and, in some cases, even reversing) high cholesterol, high blood pressure, type 2 diabetes, obesity,  and more! An overview of the three key pillars of the Pritikin Program will be covered: eating well, doing regular exercise, and having a healthy mind-set.  WORKSHOPS  Exercise: Exercise Basics: Building Your Action Plan Clinical staff led group instruction and group discussion with PowerPoint presentation and patient guidebook. To enhance the learning environment the use of posters, models and videos may be added. At the conclusion of this workshop, patients will comprehend the difference between physical activity and exercise, as well as the benefits of incorporating both, into their routine. Patients will understand the FITT (Frequency, Intensity, Time, and Type) principle and how to use it to build an exercise action plan. In addition, safety concerns and other considerations for exercise and cardiac rehab will be addressed by the presenter. The purpose of this lesson is to promote a comprehensive and effective weekly exercise routine in order to improve patients' overall level of fitness.   Managing Heart Disease: Your Path to a Healthier Heart Clinical staff led group instruction and group discussion with PowerPoint presentation and patient guidebook. To enhance the learning environment the use of posters,  models and videos may be added.At the conclusion of this workshop, patients will understand the anatomy and physiology of the heart. Additionally, they will understand how Pritikin's three pillars impact the risk factors, the progression, and the management of heart disease.  The purpose of this lesson is to provide a high-level overview of the heart, heart disease, and how the Pritikin lifestyle positively impacts risk factors.  Exercise Biomechanics Clinical staff led group instruction and group discussion with PowerPoint presentation and patient guidebook. To enhance the learning environment the use of posters, models and videos may be added. Patients will learn how the structural  parts of their bodies function and how these functions impact their daily activities, movement, and exercise. Patients will learn how to promote a neutral spine, learn how to manage pain, and identify ways to improve their physical movement in order to promote healthy living. The purpose of this lesson is to expose patients to common physical limitations that impact physical activity. Participants will learn practical ways to adapt and manage aches and pains, and to minimize their effect on regular exercise. Patients will learn how to maintain good posture while sitting, walking, and lifting.  Balance Training and Fall Prevention  Clinical staff led group instruction and group discussion with PowerPoint presentation and patient guidebook. To enhance the learning environment the use of posters, models and videos may be added. At the conclusion of this workshop, patients will understand the importance of their sensorimotor skills (vision, proprioception, and the vestibular system) in maintaining their ability to balance as they age. Patients will apply a variety of balancing exercises that are appropriate for their current level of function. Patients will understand the common causes for poor balance, possible solutions to these problems, and ways to modify their physical environment in order to minimize their fall risk. The purpose of this lesson is to teach patients about the importance of maintaining balance as they age and ways to minimize their risk of falling.  WORKSHOPS   Nutrition:  Fueling a Ship broker led group instruction and group discussion with PowerPoint presentation and patient guidebook. To enhance the learning environment the use of posters, models and videos may be added. Patients will review the foundational principles of the Pritikin Eating Plan and understand what constitutes a serving size in each of the food groups. Patients will also learn Pritikin-friendly  foods that are better choices when away from home and review make-ahead meal and snack options. Calorie density will be reviewed and applied to three nutrition priorities: weight maintenance, weight loss, and weight gain. The purpose of this lesson is to reinforce (in a group setting) the key concepts around what patients are recommended to eat and how to apply these guidelines when away from home by planning and selecting Pritikin-friendly options. Patients will understand how calorie density may be adjusted for different weight management goals.  Mindful Eating  Clinical staff led group instruction and group discussion with PowerPoint presentation and patient guidebook. To enhance the learning environment the use of posters, models and videos may be added. Patients will briefly review the concepts of the Pritikin Eating Plan and the importance of low-calorie dense foods. The concept of mindful eating will be introduced as well as the importance of paying attention to internal hunger signals. Triggers for non-hunger eating and techniques for dealing with triggers will be explored. The purpose of this lesson is to provide patients with the opportunity to review the basic principles of the Pritikin Eating Plan, discuss the  value of eating mindfully and how to measure internal cues of hunger and fullness using the Hunger Scale. Patients will also discuss reasons for non-hunger eating and learn strategies to use for controlling emotional eating.  Targeting Your Nutrition Priorities Clinical staff led group instruction and group discussion with PowerPoint presentation and patient guidebook. To enhance the learning environment the use of posters, models and videos may be added. Patients will learn how to determine their genetic susceptibility to disease by reviewing their family history. Patients will gain insight into the importance of diet as part of an overall healthy lifestyle in mitigating the impact of  genetics and other environmental insults. The purpose of this lesson is to provide patients with the opportunity to assess their personal nutrition priorities by looking at their family history, their own health history and current risk factors. Patients will also be able to discuss ways of prioritizing and modifying the Pritikin Eating Plan for their highest risk areas  Menu  Clinical staff led group instruction and group discussion with PowerPoint presentation and patient guidebook. To enhance the learning environment the use of posters, models and videos may be added. Using menus brought in from [employer], or printed from [employer], patients will apply the Pritikin dining out guidelines that were presented in the [employer] video. Patients will also be able to practice these guidelines in a variety of provided scenarios. The purpose of this lesson is to provide patients with the opportunity to practice hands-on learning of the Pritikin Dining Out guidelines with actual menus and practice scenarios.  Label Reading Clinical staff led group instruction and group discussion with PowerPoint presentation and patient guidebook. To enhance the learning environment the use of posters, models and videos may be added. Patients will review and discuss the Pritikin label reading guidelines presented in Pritikin's Label Reading Educational series video. Using fool labels brought in from local grocery stores and markets, patients will apply the label reading guidelines and determine if the packaged food meet the Pritikin guidelines. The purpose of this lesson is to provide patients with the opportunity to review, discuss, and practice hands-on learning of the Pritikin Label Reading guidelines with actual packaged food labels. Cooking School  Pritikin's [employer] are designed to teach patients ways to prepare quick, simple, and affordable recipes at home. The importance of  nutrition's role in chronic disease risk reduction is reflected in its emphasis in the overall Pritikin program. By learning how to prepare essential core Pritikin Eating Plan recipes, patients will increase control over what they eat; be able to customize the flavor of foods without the use of added salt, sugar, or fat; and improve the quality of the food they consume. By learning a set of core recipes which are easily assembled, quickly prepared, and affordable, patients are more likely to prepare more healthy foods at home. These workshops focus on convenient breakfasts, simple entres, side dishes, and desserts which can be prepared with minimal effort and are consistent with nutrition recommendations for cardiovascular risk reduction. Cooking [employer] are taught by a Armed forces logistics/support/administrative officer (RD) who has been trained by the [employer]. The chef or RD has a clear understanding of the importance of minimizing - if not completely eliminating - added fat, sugar, and sodium in recipes. Throughout the series of Cooking School Workshop sessions, patients will learn about healthy ingredients and efficient methods of cooking to build confidence in their capability to prepare    Cooking  School weekly topics:  Adding Flavor- Sodium-Free  Fast and Healthy Breakfasts  Powerhouse Plant-Based Proteins  Satisfying Salads and Dressings  Simple Sides and Sauces  International Cuisine-Spotlight on the Blue Zones  Delicious Desserts  Savory Soups  Efficiency Cooking - Meals in a Snap  Tasty Appetizers and Snacks  Comforting Weekend Breakfasts  One-Pot Wonders   Fast Evening Meals  Landscape architect Your Pritikin Plate  WORKSHOPS   Healthy Mindset (Psychosocial):  Focused Goals, Sustainable Changes Clinical staff led group instruction and group discussion with PowerPoint presentation and patient guidebook. To enhance the learning environment the use of posters,  models and videos may be added. Patients will be able to apply effective goal setting strategies to establish at least one personal goal, and then take consistent, meaningful action toward that goal. They will learn to identify common barriers to achieving personal goals and develop strategies to overcome them. Patients will also gain an understanding of how our mind-set can impact our ability to achieve goals and the importance of cultivating a positive and growth-oriented mind-set. The purpose of this lesson is to provide patients with a deeper understanding of how to set and achieve personal goals, as well as the tools and strategies needed to overcome common obstacles which may arise along the way.  From Head to Heart: The Power of a Healthy Outlook  Clinical staff led group instruction and group discussion with PowerPoint presentation and patient guidebook. To enhance the learning environment the use of posters, models and videos may be added. Patients will be able to recognize and describe the impact of emotions and mood on physical health. They will discover the importance of self-care and explore self-care practices which may work for them. Patients will also learn how to utilize the 4 C's to cultivate a healthier outlook and better manage stress and challenges. The purpose of this lesson is to demonstrate to patients how a healthy outlook is an essential part of maintaining good health, especially as they continue their cardiac rehab journey.  Healthy Sleep for a Healthy Heart Clinical staff led group instruction and group discussion with PowerPoint presentation and patient guidebook. To enhance the learning environment the use of posters, models and videos may be added. At the conclusion of this workshop, patients will be able to demonstrate knowledge of the importance of sleep to overall health, well-being, and quality of life. They will understand the symptoms of, and treatments for, common sleep  disorders. Patients will also be able to identify daytime and nighttime behaviors which impact sleep, and they will be able to apply these tools to help manage sleep-related challenges. The purpose of this lesson is to provide patients with a general overview of sleep and outline the importance of quality sleep. Patients will learn about a few of the most common sleep disorders. Patients will also be introduced to the concept of "sleep hygiene," and discover ways to self-manage certain sleeping problems through simple daily behavior changes. Finally, the workshop will motivate patients by clarifying the links between quality sleep and their goals of heart-healthy living.   Recognizing and Reducing Stress Clinical staff led group instruction and group discussion with PowerPoint presentation and patient guidebook. To enhance the learning environment the use of posters, models and videos may be added. At the conclusion of this workshop, patients will be able to understand the types of stress reactions, differentiate between acute and chronic stress, and recognize the impact that chronic stress has on their health. They will also  be able to apply different coping mechanisms, such as reframing negative self-talk. Patients will have the opportunity to practice a variety of stress management techniques, such as deep abdominal breathing, progressive muscle relaxation, and/or guided imagery.  The purpose of this lesson is to educate patients on the role of stress in their lives and to provide healthy techniques for coping with it.  Learning Barriers/Preferences:  Learning Barriers/Preferences - 05/18/24 1519       Learning Barriers/Preferences   Learning Barriers Exercise Concerns   Prior fall, 11 seconds in single leg stand   Learning Preferences Written Material;Pictoral;Individual Instruction;Group Instruction          Education Topics:  Knowledge Questionnaire Score:  Knowledge Questionnaire Score -  05/18/24 1521       Knowledge Questionnaire Score   Pre Score 23/24          Core Components/Risk Factors/Patient Goals at Admission:  Personal Goals and Risk Factors at Admission - 05/18/24 1524       Core Components/Risk Factors/Patient Goals on Admission   Heart Failure Yes    Intervention Provide a combined exercise and nutrition program that is supplemented with education, support and counseling about heart failure. Directed toward relieving symptoms such as shortness of breath, decreased exercise tolerance, and extremity edema.    Expected Outcomes Improve functional capacity of life;Short term: Attendance in program 2-3 days a week with increased exercise capacity. Reported lower sodium intake. Reported increased fruit and vegetable intake. Reports medication compliance.;Short term: Daily weights obtained and reported for increase. Utilizing diuretic protocols set by physician.;Long term: Adoption of self-care skills and reduction of barriers for early signs and symptoms recognition and intervention leading to self-care maintenance.    Hypertension Yes    Intervention Provide education on lifestyle modifcations including regular physical activity/exercise, weight management, moderate sodium restriction and increased consumption of fresh fruit, vegetables, and low fat dairy, alcohol moderation, and smoking cessation.;Monitor prescription use compliance.    Expected Outcomes Short Term: Continued assessment and intervention until BP is < 140/71mm HG in hypertensive participants. < 130/88mm HG in hypertensive participants with diabetes, heart failure or chronic kidney disease.;Long Term: Maintenance of blood pressure at goal levels.    Lipids Yes    Intervention Provide education and support for participant on nutrition & aerobic/resistive exercise along with prescribed medications to achieve LDL 70mg , HDL >40mg .    Expected Outcomes Short Term: Participant states understanding of desired  cholesterol values and is compliant with medications prescribed. Participant is following exercise prescription and nutrition guidelines.;Long Term: Cholesterol controlled with medications as prescribed, with individualized exercise RX and with personalized nutrition plan. Value goals: LDL < 70mg , HDL > 40 mg.    Stress Yes    Intervention Offer individual and/or small group education and counseling on adjustment to heart disease, stress management and health-related lifestyle change. Teach and support self-help strategies.;Refer participants experiencing significant psychosocial distress to appropriate mental health specialists for further evaluation and treatment. When possible, include family members and significant others in education/counseling sessions.    Expected Outcomes Short Term: Participant demonstrates changes in health-related behavior, relaxation and other stress management skills, ability to obtain effective social support, and compliance with psychotropic medications if prescribed.;Long Term: Emotional wellbeing is indicated by absence of clinically significant psychosocial distress or social isolation.    Personal Goal Other Yes    Personal Goal Goals: Incease strength and endurance, get back to the way she felt before, less fatigue    Intervention Will continue to monitor  pt and progress workloads  as tolerated without sign or symptom    Expected Outcomes Pt will achieve her goals and increase strength          Core Components/Risk Factors/Patient Goals Review:    Core Components/Risk Factors/Patient Goals at Discharge (Final Review):    ITP Comments:  ITP Comments     Row Name 05/18/24 1500 05/23/24 1746         ITP Comments Dr. Wilbert Bihari medical director. Introduction to pritikin education program/intensive cardiac rehab. Initial orientation packet reviewed with patient 30 Day ITP Review. Carriann will tenatively start exercise at cardiac rehab on 05/24/24.          Comments: See ITP comments.Hadassah Elpidio Quan RN BSN

## 2024-05-24 ENCOUNTER — Encounter (HOSPITAL_COMMUNITY)
Admission: RE | Admit: 2024-05-24 | Discharge: 2024-05-24 | Disposition: A | Discharge status: Home / Self Care | Source: Ambulatory Visit | Attending: Cardiovascular Disease | Admitting: Cardiovascular Disease

## 2024-05-24 DIAGNOSIS — Z952 Presence of prosthetic heart valve: Secondary | ICD-10-CM | POA: Diagnosis not present

## 2024-05-24 NOTE — Progress Notes (Signed)
 Daily Session Note  Patient Details  Name: Gina Harrell MRN: [id number] Date of Birth: 1941-01-30 Referring Provider:   Flowsheet Row INTENSIVE CARDIAC REHAB ORIENT from 05/18/2024 in [facility]  Referring Provider Dr. Jerel Balding, MD    Encounter Date: 05/24/2024  Check In:  Session Check In - 05/24/24 1500       Check-In   Supervising physician immediately available to respond to emergencies [facility] - Physician supervision    Physician(s) Lum Louis, NP    Location [facility]    Staff Present Hadassah Quan, RN, Avonne Gal, MS, ACSM-CEP, Exercise Physiologist;David Janann, MS, ACSM-CEP, CCRP, Exercise Physiologist;Bailey Elnor, MS, Exercise Physiologist;Jetta Walker BS, ACSM-CEP, Exercise Physiologist;Other    Virtual Visit No    Medication changes reported     No    Fall or balance concerns reported    No    Tobacco Cessation No Change    Warm-up and Cool-down Performed as group-led instruction    Resistance Training Performed No    VAD Patient? No    PAD/SET Patient? No      Pain Assessment   Currently in Pain? No/denies    Pain Score 0-No pain    Multiple Pain Sites No          Capillary Blood Glucose: No results found for this or any previous visit (from the past 24 hours).   Exercise Prescription Changes - 05/24/24 1627       Response to Exercise   Blood Pressure (Admit) 142/80    Blood Pressure (Exercise) 160/68    Blood Pressure (Exit) 120/78    Heart Rate (Admit) 59 bpm    Heart Rate (Exercise) 104 bpm    Heart Rate (Exit) 66 bpm    Rating of Perceived Exertion (Exercise) 11    Perceived Dyspnea (Exercise) 0    Symptoms none    Comments Pt first day in hte [facility] ICR program.    Duration Progress to 30 minutes of  aerobic without signs/symptoms of physical distress    Intensity THRR unchanged      Progression   Progression Continue to progress  workloads to maintain intensity without signs/symptoms of physical distress.    Average METs 2.36      Resistance Training   Training Prescription No    Weight 2    Reps 10-15    Time 10 Minutes      NuStep   Level 1    SPM 66    Minutes 15    METs 1.8      Track   Laps 15    Minutes 15    METs 2.91          Social History   Tobacco Use  Smoking Status Every Day   Current packs/day: 0.50   Average packs/day: 0.5 packs/day for 50.0 years (25.0 ttl pk-yrs)   Types: Cigarettes  Smokeless Tobacco Never  Tobacco Comments   03/20/2024 Patient smokes 8 cigarettes daily   01/03/2024 Patient states one pack last about three days    Goals Met:  Exercise tolerated well No report of concerns or symptoms today  Goals Unmet:  Not Applicable  Comments: Pt started cardiac rehab today.  Pt tolerated light exercise without difficulty. VSS, telemetry-SB at rest, NSR when exercising, asymptomatic.  Medication list reconciled. Pt denies barriers to medication compliance.  PSYCHOSOCIAL ASSESSMENT:  PHQ-2. Pt exhibits positive coping skills, hopeful outlook with supportive family.  No psychosocial needs identified at this time, no psychosocial interventions necessary.    Pt enjoys friends and her daughter.   Pt oriented to exercise equipment and routine.    Understanding verbalized.  Pt verbalized smoking 8 cigarettes/day and has no desire to quit/reduce at this time.   RODGER Music, RN BSN   Dr. Wilbert Bihari is Medical Director for Cardiac Rehab at [facility].

## 2024-05-26 ENCOUNTER — Encounter (HOSPITAL_COMMUNITY)

## 2024-05-29 ENCOUNTER — Encounter (HOSPITAL_COMMUNITY)
Admission: RE | Admit: 2024-05-29 | Discharge: 2024-05-29 | Disposition: A | Discharge status: Home / Self Care | Source: Ambulatory Visit | Attending: Cardiovascular Disease | Admitting: Cardiovascular Disease

## 2024-05-29 DIAGNOSIS — Z952 Presence of prosthetic heart valve: Secondary | ICD-10-CM

## 2024-05-31 ENCOUNTER — Encounter (HOSPITAL_COMMUNITY)
Admission: RE | Admit: 2024-05-31 | Discharge: 2024-05-31 | Disposition: A | Discharge status: Home / Self Care | Source: Ambulatory Visit | Attending: Cardiovascular Disease

## 2024-05-31 ENCOUNTER — Other Ambulatory Visit (HOSPITAL_COMMUNITY): Payer: Self-pay | Admitting: Physician Assistant

## 2024-05-31 DIAGNOSIS — Z952 Presence of prosthetic heart valve: Secondary | ICD-10-CM

## 2024-06-01 ENCOUNTER — Ambulatory Visit (HOSPITAL_COMMUNITY)
Admission: RE | Admit: 2024-06-01 | Discharge: 2024-06-01 | Disposition: A | Discharge status: Home / Self Care | Source: Ambulatory Visit | Attending: Cardiovascular Disease | Admitting: Cardiovascular Disease

## 2024-06-01 ENCOUNTER — Encounter: Payer: Self-pay | Admitting: Physician Assistant

## 2024-06-01 ENCOUNTER — Ambulatory Visit: Payer: Self-pay | Admitting: Physician Assistant

## 2024-06-01 ENCOUNTER — Ambulatory Visit (INDEPENDENT_AMBULATORY_CARE_PROVIDER_SITE_OTHER): Admitting: Physician Assistant

## 2024-06-01 VITALS — BP 150/78 | HR 62 | Ht 59.0 in | Wt 111.8 lb

## 2024-06-01 DIAGNOSIS — I2510 Atherosclerotic heart disease of native coronary artery without angina pectoris: Secondary | ICD-10-CM | POA: Insufficient documentation

## 2024-06-01 DIAGNOSIS — E785 Hyperlipidemia, unspecified: Secondary | ICD-10-CM

## 2024-06-01 DIAGNOSIS — I10 Essential (primary) hypertension: Secondary | ICD-10-CM | POA: Diagnosis present

## 2024-06-01 DIAGNOSIS — Z952 Presence of prosthetic heart valve: Secondary | ICD-10-CM | POA: Insufficient documentation

## 2024-06-01 LAB — ECHOCARDIOGRAM COMPLETE
AV Mean grad: 7.3 mmHg
AV Peak grad: 14.1 mmHg
Ao pk vel: 1.88 m/s
Area-P 1/2: 3.91 cm2
P 1/2 time: 720 ms
S' Lateral: 2.5 cm

## 2024-06-01 MED ORDER — LOSARTAN POTASSIUM 50 MG PO TABS: 50.0000 mg | ORAL_TABLET | Freq: Every day | ORAL | 3 refills | Status: AC

## 2024-06-01 NOTE — Progress Notes (Signed)
 Discussed with the patient during today's office visit. Plan for repeat in 1 year.

## 2024-06-01 NOTE — Progress Notes (Unsigned)
 Cardiology Office Note   Date:  06/02/2024  ID:  Gina Harrell, DOB Feb 19, 1941, MRN [id number] PCP: Gina Ade, MD  [facility] Providers Cardiologist:  Gina Balding, MD Structural Heart:  Gina MARLA Red, MD    History of Present Illness Gina Harrell is a 83 y.o. 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  Carotid Doppler in September 2024 showed 1 to 39% right ICA disease, 40 to 59% left ICA disease.  She was hospitalized in October 2024 with pyelonephritis.  Echocardiogram in October 2024 was concerning for severe aortic stenosis.  Cardiac catheterization performed on 01/27/2024 showed moderate nonobstructive disease with 50% proximal LAD and 50% proximal RCA.  Ultimately underwent TAVR procedure by Dr. Red and Dr. Shyrl on 04/18/2024 using Celestia SAPIEN 3 THV 23 mm sized valve.  Postprocedural echocardiogram obtained on 04/19/2024 showed EF 70 to 75%, grade 1 DD, trivial MR, 23 mm Edwards SAPIEN TAVR valve present in the aortic position, aortic mean gradient 10 mmHg, normal function of the aortic valve prosthesis, no significant paravalvular leakage.  Repeat echocardiogram obtained this morning showed EF 60 to 65%, grade 1 DD, trivial MR, trivial paravalvular leakage along the anterior leaflet of the mitral valve, 23 mm sapient prosthetic valve present in the aortic position.  Aortic valve mean gradient is 7.3 mmHg.  Patient presents today for follow-up.  I reviewed the echocardiogram that was obtained this morning.  Blood pressure is elevated today, and even at home systolic blood pressure has been running in the 130-140s, I will increase losartan  to 50 mg daily.  She will need a repeat echocardiogram in 12 months before follow-up with Dr. Red.  During the meantime, she can continue  to follow-up with Dr. Balding.  Her next follow-up with Dr. Balding is in September.  Overall, she feels well and denies any chest pain, shortness of breath, lower extremity edema, apnea or PND.  She does have a slight heart murmur at the aortic valve area on exam.  ROS:   She denies chest pain, palpitations, dyspnea, pnd, orthopnea, n, v, dizziness, syncope, edema, weight gain, or early satiety. All other systems reviewed and are otherwise negative except as noted above.    Studies Reviewed      Cardiac Studies & Procedures   ______________________________________________________________________________________________ CARDIAC CATHETERIZATION  CARDIAC CATHETERIZATION 01/31/2024  Conclusion   Prox LAD lesion is 50% stenosed.   Mid LAD lesion is 30% stenosed.   Prox RCA lesion is 50% stenosed.  1.  Moderate disease of the proximal LAD and right coronary arteries.  FFR angiography (3D mapping) assessment of the proximal LAD was 0.81 and of the proximal RCA was 0.93 so further interventions were deferred. 2.  Fick cardiac output of 3.0 L/min and Fick cardiac index of 2.1 L/min/m with the following hemodynamics: Right atrial pressure mean of 12 mmHg Right ventricular pressure 45/11 with an end-diastolic pressure of 19 mmHg Wedge pressure mean of 13 mmHg with V waves to 23 mmHg PA pressure 40/23 with a mean of 30 mmHg PVR 5.6 Woods units PA pulsatility index of 1.4 3.  Mixed pulmonary hypertension with elevated wedge pressure and elevated PVR consistent with pulmonary arterial hypertension. 4.  Simultaneous aortic and left ventricular pressures were assessed with a Langston catheter resulting in a mean gradient of 26 mmHg, aortic valve area of 0.71 cm, and indexed aortic  valve area of 0.5 cm/m consistent with severe aortic stenosis. 5.  Capacious iliofemoral vessels bilaterally; of note the bifurcations bilaterally are quite high and if a transcatheter aortic valve replacement is  pursued the right femoral approach would be preferred.  Recommendation: Continue evaluation for aortic valve intervention.   Will start Lasix  20 mg daily for elevated filling pressures.  The results were reviewed with Dr. Francyne.  Findings Coronary Findings Diagnostic  Dominance: Right  Left Anterior Descending The vessel exhibits minimal luminal irregularities. Prox LAD lesion is 50% stenosed. Mid LAD lesion is 30% stenosed.  Right Coronary Artery Prox RCA lesion is 50% stenosed.  Intervention  No interventions have been documented.     ECHOCARDIOGRAM  ECHOCARDIOGRAM COMPLETE 06/01/2024  Narrative ECHOCARDIOGRAM REPORT    Patient Name:   Gina Harrell Date of Exam: 06/01/2024 Medical Rec #:  [id number]         Height:       59.0 in Accession #:    [id number]        Weight:       112.6 lb Date of Birth:  10/11/1941        BSA:          1.445 m Patient Age:    82 years          BP:           128/78 mmHg Patient Gender: F                 HR:           56 bpm. Exam Location:  [address]  Procedure: 2D Echo, Cardiac Doppler and Color Doppler (Both Spectral and Color Flow Doppler were utilized during procedure).  Indications:    Z95.2 Status post TAVR  History:        Patient has prior history of Echocardiogram examinations, most recent 04/19/2024. TAVR-73mm Edwards Sapien 3UR., COPD; Risk Factors:Hypertension and Dyslipidemia. Aortic Valve: 23 mm Sapien prosthetic, stented (TAVR) valve is present in the aortic position.  Sonographer:    Carl Coma RDCS Referring Phys: [id number] KATHRYN R THOMPSON  IMPRESSIONS   1. Left ventricular ejection fraction, by estimation, is 60 to 65%. The left ventricle has normal function. The left ventricle has no regional wall motion abnormalities. Left ventricular diastolic parameters are consistent with Grade I diastolic dysfunction (impaired relaxation). 2. Right ventricular systolic function is normal. The right  ventricular size is normal. 3. . The mitral valve is normal in structure. Trivial mitral valve regurgitation. No evidence of mitral stenosis. 4. Trivial perivalvular leak along the aspect of the anterior leaflet of mitral valve. . The aortic valve has been repaired/replaced. Aortic valve regurgitation is not visualized. No aortic stenosis is present. There is a 23 mm Sapien prosthetic (TAVR) valve present in the aortic position. Echo findings are consistent with perivalvular leak of the aortic prosthesis. Aortic valve mean gradient measures 7.3 mmHg. Aortic valve Vmax measures 1.88 m/s. 5. The inferior vena cava is normal in size with greater than 50% respiratory variability, suggesting right atrial pressure of 3 mmHg.  FINDINGS Left Ventricle: Left ventricular ejection fraction, by estimation, is 60 to 65%. The left ventricle has normal function. The left ventricle has no regional wall motion abnormalities. The left ventricular internal cavity size was normal in size. There is no left ventricular hypertrophy. Left ventricular diastolic parameters are consistent with Grade I diastolic dysfunction (impaired relaxation).  Right Ventricle: The right ventricular size is normal. No increase in  right ventricular wall thickness. Right ventricular systolic function is normal.  Left Atrium: Left atrial size was normal in size.  Right Atrium: Right atrial size was normal in size.  Pericardium: There is no evidence of pericardial effusion.  Mitral Valve: The mitral valve is normal in structure. Trivial mitral valve regurgitation. No evidence of mitral valve stenosis.  Tricuspid Valve: The tricuspid valve is normal in structure. Tricuspid valve regurgitation is not demonstrated. No evidence of tricuspid stenosis.  Trivial perivalvular leak along the aspect of the anterior leaflet of mitral valve. The aortic valve has been repaired/replaced. Aortic valve regurgitation is not visualized. No aortic stenosis  is present. There is a 23 mm Sapien prosthetic, stented (TAVR) valve present in the aortic position. Pulmonic Valve: The pulmonic valve was normal in structure. Pulmonic valve regurgitation is trivial. No evidence of pulmonic stenosis.  Aorta: The aortic root is normal in size and structure.  Venous: The inferior vena cava is normal in size with greater than 50% respiratory variability, suggesting right atrial pressure of 3 mmHg.  IAS/Shunts: No atrial level shunt detected by color flow Doppler.   LEFT VENTRICLE PLAX 2D LVIDd:         3.90 cm Diastology LVIDs:         2.50 cm LV e' medial:    7.56 cm/s LV PW:         0.90 cm LV E/e' medial:  9.6 LV IVS:        0.90 cm LV e' lateral:   8.16 cm/s LV E/e' lateral: 8.9   RIGHT VENTRICLE             IVC RV Basal diam:  3.10 cm     IVC diam: 1.70 cm RV S prime:     12.95 cm/s TAPSE (M-mode): 2.2 cm  LEFT ATRIUM           Index        RIGHT ATRIUM          Index LA diam:      3.60 cm 2.49 cm/m   RA Area:     9.43 cm LA Vol (A4C): 21.7 ml 15.02 ml/m  RA Volume:   19.20 ml 13.29 ml/m AORTIC VALVE AV Vmax:           188.00 cm/s AV Vmean:          123.000 cm/s AV VTI:            0.442 m AV Peak Grad:      14.1 mmHg AV Mean Grad:      7.3 mmHg LVOT Vmax:         106.00 cm/s LVOT Vmean:        65.200 cm/s LVOT VTI:          0.250 m LVOT/AV VTI ratio: 0.57 AI PHT:            720 msec  AORTA Ao Root diam: 2.70 cm Ao Asc diam:  3.50 cm  MITRAL VALVE MV Area (PHT): 3.91 cm    SHUNTS MV Decel Time: 194 msec    Systemic VTI: 0.25 m MV E velocity: 72.80 cm/s MV A velocity: 89.10 cm/s MV E/A ratio:  0.82  Oneil Parchment MD Electronically signed by Oneil Parchment MD Signature Date/Time: 06/01/2024/1:51:52 PM    Final      CT SCANS  CT CORONARY MORPH W/CTA COR W/SCORE 03/14/2024  Addendum 03/14/2024  3:08 PM ADDENDUM REPORT: 03/14/2024 15:06  ADDENDUM: Motion corrected 25%  measurements as follows  Diameter 24.6 mm x 22.5 mm  average diameter annulus 23.4 mm  Perimeter 73.9 mm  Area 429 mm 2  This would suggest that a soft 26 mm Sapien minus 1 cc may be suitable for deployment  Industry and structural team to review   Electronically Signed By: Maude Emmer M.D. On: 03/14/2024 15:06  Narrative CLINICAL DATA:  Pre TAVR  Aortic stenosis  EXAM: Cardiac TAVR CT  TECHNIQUE: The patient was scanned on a GE apex scanner. 1 beat acquisition triggered in the descending thoracic aorta at 110 HU's. A non contrast, gated CT scan was obtained first with axial slices of 2.5 mm through the heart for valve scoring. A 120 kV retrospective, gated, contrast scan done with gantry rotation speed of 230 msec and collimation 0.63 mm. A delayed scan was obtained to exclude LAA thrombus. The 3D data set was reconstructed in 5% intervals of the R-R cycle. Best systolic phase was motion corrected Images were analyzed on a dedicated workstation using MPR, MIP and VRT modes The patient received 100 cc of contrast  FINDINGS: Aortic Valve: Calcified tri leaflet AV with score 1067  Aorta: No aneurysm normal arch vessels mild calcific atherosclerosis  Sinotubular Junction: 26 mm  Ascending Thoracic Aorta: 33 mm  Aortic Arch: 26.6 mm  Descending Thoracic Aorta: 24.8 mm  Sinus of Valsalva Measurements:  Non-coronary: 28.6 mm   height 19.9 mm  Right - coronary: 25.4 mm  height 19.1 mm  Left - coronary: 27.7 mm  height 19.6 mm  Coronary Artery Height above Annulus:  Left Main: 14.4 mm above annulus  Right Coronary: 15.5 mm above annulus  Virtual Basal Annulus Measurements:  Maximum/Minimum Diameter: 24.3 mm x 20.9 mm  Perimeter: 72.8   mm  Area: 403 mm2  Coronary Arteries: Sufficient height above annulus for deployment  Optimum Fluoroscopic Angle for Delivery: RAO 6 Caudal 14 degrees  Membranous septal length 6.6 mm  IMPRESSION: Note the techs were having difficulty sending motion  corrected systolic phases to Tera Recon. The best systolic phase Measured was 40% but trying to get freeze motion corrected 20-30% phases from scanner  1. Calcified tri leaflet AV with score 1067  2. Annular area of 403 mm2 lower limit for 23 mm Sapien 3 valve Sinuses small for a 29 mm Medtronic valve  3.  Coronary arteries suitable height for deployment  4. Optimum angiographic angle for deployment RAO 6 Caudal 14 degrees  5.  Membranous septal length 6.6 mm  Maude Emmer  Electronically Signed: By: Maude Emmer M.D. On: 03/14/2024 13:40     ______________________________________________________________________________________________      Risk Assessment/Calculations          Physical Exam VS:  BP (!) 150/78 (BP Location: Left Arm, Patient Position: Sitting)   Pulse 62   Ht 4' 11 (1.499 m)   Wt 111 lb 12.8 oz (50.7 kg)   SpO2 98%   BMI 22.58 kg/m        Wt Readings from Last 3 Encounters:  06/01/24 111 lb 12.8 oz (50.7 kg)  05/18/24 112 lb 9.6 oz (51.1 kg)  04/24/24 109 lb (49.4 kg)    GEN: Well nourished, well developed in no acute distress NECK: No JVD; No carotid bruits CARDIAC: RRR, no murmurs, rubs, gallops RESPIRATORY:  Clear to auscultation without rales, wheezing or rhonchi  ABDOMEN: Soft, non-tender, non-distended EXTREMITIES:  No edema; No deformity   ASSESSMENT AND PLAN  Severe AS s/p TAVR: echo today  with EF 60%, normally functioning TAVR with a mean gradient of 7.3 mmHg and trivial PVL. She has had a marked symptom improvement with NYHA class I symptoms.  Plan for repeat echocardiogram in 1 year  CAD: Recent cardiac catheterization showed nonobstructive disease.  Denies any chest pain  Hypertension: Blood pressure mildly elevated today, will increase losartan  to 50 mg daily  Hyperlipidemia: On rosuvastatin         Dispo: Follow-up with Dr. Francyne as previously scheduled.  Follow-up with Dr. Wendel in 1 year after repeat  echocardiogram  Signed, Neriyah Cercone, PA

## 2024-06-01 NOTE — Patient Instructions (Signed)
 Medication Instructions:  Increase Losartan  50 mg daily  *If you need a refill on your cardiac medications before your next appointment, please call your pharmacy*  Lab Work: NONE ordered at this time of appointment   Testing/Procedures: Repeat Echo in 1 year.  Follow-Up: At [facility], you and your health needs are our priority.  As part of our continuing mission to provide you with exceptional heart care, our providers are all part of one team.  This team includes your primary Cardiologist (physician) and Advanced Practice Providers or APPs (Physician Assistants and Nurse Practitioners) who all work together to provide you with the care you need, when you need it.  Your next appointment:   Keep f/u with Dr. JAYSON  Provider:   Jerel Balding, MD    We recommend signing up for the patient portal called MyChart.  Sign up information is provided on this After Visit Summary.  MyChart is used to connect with patients for Virtual Visits (Telemedicine).  Patients are able to view lab/test results, encounter notes, upcoming appointments, etc.  Non-urgent messages can be sent to your provider as well.   To learn more about what you can do with MyChart, go to [email or web address].   Other Instructions

## 2024-06-02 ENCOUNTER — Encounter (HOSPITAL_COMMUNITY)

## 2024-06-05 ENCOUNTER — Encounter (HOSPITAL_COMMUNITY)
Admission: RE | Admit: 2024-06-05 | Discharge: 2024-06-05 | Disposition: A | Discharge status: Home / Self Care | Source: Ambulatory Visit | Attending: Cardiovascular Disease | Admitting: Cardiovascular Disease

## 2024-06-05 ENCOUNTER — Encounter (HOSPITAL_COMMUNITY): Payer: Self-pay | Admitting: Internal Medicine

## 2024-06-05 DIAGNOSIS — Z952 Presence of prosthetic heart valve: Secondary | ICD-10-CM | POA: Diagnosis not present

## 2024-06-07 ENCOUNTER — Telehealth: Payer: Self-pay | Admitting: Cardiovascular Disease

## 2024-06-07 ENCOUNTER — Encounter (HOSPITAL_COMMUNITY): Admission: RE | Admit: 2024-06-07 | Source: Ambulatory Visit

## 2024-06-07 NOTE — Telephone Encounter (Signed)
 Spoke to patient Dr.Croitoru's advice given.Stated she already has Amoxicillin.She will take 2 gms 2 hours before her 8:00 am appointment tomorrow.Advised to continue Aspirin .

## 2024-06-07 NOTE — Telephone Encounter (Signed)
 She does require amoxicillin 2 g 1-2 hours before the procedure.  Typically there is no need to hold antiplatelet agents before this type of dental procedure, she should not interrupt them so soon after the TAVR.

## 2024-06-07 NOTE — Telephone Encounter (Signed)
Message sent to Dr.Croitoru for advice. 

## 2024-06-07 NOTE — Telephone Encounter (Signed)
 What dental office are you calling from? [facility]   What is your office phone number? [phone]   What is your fax number?[phone]  What type of procedure is the patient having performed? [place]/Local anesthesia   What date is procedure scheduled or is the patient there now? 06/08/24 at 8:00  (if the patient is at the dentist's office question goes to their cardiologist if he/she is in the office.  If not, question should go to the DOD).   What is your question (ex. Antibiotics prior to procedure, holding medication-we need to know how long dentist wants pt to hold med)? Pt had TAVR on 04/09/24. Can they do procedure?

## 2024-06-09 ENCOUNTER — Encounter (HOSPITAL_COMMUNITY)

## 2024-06-12 ENCOUNTER — Encounter (HOSPITAL_COMMUNITY)
Admission: RE | Admit: 2024-06-12 | Discharge: 2024-06-12 | Disposition: A | Discharge status: Home / Self Care | Source: Ambulatory Visit | Attending: Cardiovascular Disease | Admitting: Cardiovascular Disease

## 2024-06-12 DIAGNOSIS — Z952 Presence of prosthetic heart valve: Secondary | ICD-10-CM | POA: Insufficient documentation

## 2024-06-12 NOTE — Progress Notes (Signed)
 Incomplete Session Note  Patient Details  Name: Gina Harrell MRN: [id number] Date of Birth: [birth year] Referring Provider:   Flowsheet Row INTENSIVE CARDIAC REHAB ORIENT from 05/18/2024 in [facility]  Referring Provider Dr. Jerel Balding, MD    [name] did not complete her rehab session.  Lonell came by to report that she has not been feeling well since she had dental work last week, feels weak and has an intermittent  low grade fever. Lonell says she is going to the urgent care to be evaluated today and not exercise today.Hadassah Elpidio Quan RN BSN

## 2024-06-13 ENCOUNTER — Encounter (HOSPITAL_COMMUNITY): Payer: Self-pay | Admitting: Emergency Medicine

## 2024-06-13 ENCOUNTER — Other Ambulatory Visit: Payer: Self-pay

## 2024-06-13 ENCOUNTER — Emergency Department (HOSPITAL_COMMUNITY)
Admission: EM | Admit: 2024-06-13 | Discharge: 2024-06-14 | Disposition: A | Discharge status: Home / Self Care | Attending: Emergency Medicine | Admitting: Emergency Medicine

## 2024-06-13 DIAGNOSIS — R6883 Chills (without fever): Secondary | ICD-10-CM

## 2024-06-13 DIAGNOSIS — J449 Chronic obstructive pulmonary disease, unspecified: Secondary | ICD-10-CM | POA: Diagnosis not present

## 2024-06-13 DIAGNOSIS — Z7951 Long term (current) use of inhaled steroids: Secondary | ICD-10-CM | POA: Insufficient documentation

## 2024-06-13 DIAGNOSIS — Z79899 Other long term (current) drug therapy: Secondary | ICD-10-CM | POA: Diagnosis not present

## 2024-06-13 DIAGNOSIS — R509 Fever, unspecified: Secondary | ICD-10-CM | POA: Insufficient documentation

## 2024-06-13 DIAGNOSIS — I130 Hypertensive heart and chronic kidney disease with heart failure and stage 1 through stage 4 chronic kidney disease, or unspecified chronic kidney disease: Secondary | ICD-10-CM | POA: Diagnosis not present

## 2024-06-13 DIAGNOSIS — N179 Acute kidney failure, unspecified: Secondary | ICD-10-CM | POA: Diagnosis not present

## 2024-06-13 DIAGNOSIS — I509 Heart failure, unspecified: Secondary | ICD-10-CM | POA: Diagnosis not present

## 2024-06-13 DIAGNOSIS — Z7982 Long term (current) use of aspirin: Secondary | ICD-10-CM | POA: Diagnosis not present

## 2024-06-13 DIAGNOSIS — N189 Chronic kidney disease, unspecified: Secondary | ICD-10-CM | POA: Diagnosis not present

## 2024-06-13 DIAGNOSIS — I2510 Atherosclerotic heart disease of native coronary artery without angina pectoris: Secondary | ICD-10-CM | POA: Insufficient documentation

## 2024-06-13 LAB — BASIC METABOLIC PANEL WITH GFR
Anion gap: 13 (ref 5–15)
BUN: 23 mg/dL (ref 8–23)
CO2: 18 mmol/L — ABNORMAL LOW (ref 22–32)
Calcium: 9 mg/dL (ref 8.9–10.3)
Chloride: 101 mmol/L (ref 98–111)
Creatinine, Ser: 1.45 mg/dL — ABNORMAL HIGH (ref 0.44–1.00)
GFR, Estimated: 36 mL/min — ABNORMAL LOW (ref >60)
Glucose, Bld: 103 mg/dL — ABNORMAL HIGH (ref 70–99)
Potassium: 4.1 mmol/L (ref 3.5–5.1)
Sodium: 132 mmol/L — ABNORMAL LOW (ref 135–145)

## 2024-06-13 LAB — CBC WITH DIFFERENTIAL/PLATELET
Abs Immature Granulocytes: 0.02 K/uL (ref 0.00–0.07)
Basophils Absolute: 0.1 K/uL (ref 0.0–0.1)
Basophils Relative: 1 %
Eosinophils Absolute: 0.1 K/uL (ref 0.0–0.5)
Eosinophils Relative: 1 %
HCT: 45.2 % (ref 36.0–46.0)
Hemoglobin: 14.5 g/dL (ref 12.0–15.0)
Immature Granulocytes: 0 %
Lymphocytes Relative: 12 %
Lymphs Abs: 0.7 K/uL (ref 0.7–4.0)
MCH: 29.5 pg (ref 26.0–34.0)
MCHC: 32.1 g/dL (ref 30.0–36.0)
MCV: 92.1 fL (ref 80.0–100.0)
Monocytes Absolute: 0.8 K/uL (ref 0.1–1.0)
Monocytes Relative: 14 %
Neutro Abs: 4 K/uL (ref 1.7–7.7)
Neutrophils Relative %: 72 %
Platelets: 128 K/uL — ABNORMAL LOW (ref 150–400)
RBC: 4.91 MIL/uL (ref 3.87–5.11)
RDW: 12.3 % (ref 11.5–15.5)
WBC: 5.6 K/uL (ref 4.0–10.5)
nRBC: 0 % (ref 0.0–0.2)

## 2024-06-13 NOTE — ED Provider Triage Note (Signed)
 Emergency Medicine Provider Triage Evaluation Note  Gina Harrell , a 83 y.o. female  was evaluated in triage.  Pt complains of chills, no documented fever. No other complaints. No symptoms. No problems with her dental work.   Review of Systems  Positive: Chills Negative: Dysuria, cough, NVD  Physical Exam  BP 131/84   Pulse 82   Temp 98.5 F (36.9 C) (Oral)   Resp 18   Ht 4' 11 (1.499 m)   Wt 50 kg   SpO2 100%   BMI 22.26 kg/m  Gen:   Awake, no distress   Resp:  Normal effort  MSK:   Moves extremities without difficulty  Other:    Medical Decision Making  Medically screening exam initiated at 6:07 PM.  Appropriate orders placed.  Gina Harrell was informed that the remainder of the evaluation will be completed by another provider, this initial triage assessment does not replace that evaluation, and the importance of remaining in the ED until their evaluation is complete.     Shermon Warren SAILOR, PA-C 06/13/24 [id number]

## 2024-06-13 NOTE — ED Triage Notes (Signed)
 Pt arrives via POV. Pt reports after having a dental procedure on Thursday for partial dentures, she has been experiencing fever and chills. Pt is AxOx4.

## 2024-06-13 NOTE — ED Notes (Signed)
 Patient states she went to UC yesterday and started on antibiotics. States she received a call from UC this morning that her blood work from yesterday clotted and they could not run it so sent the patient here for further evaluation.

## 2024-06-14 ENCOUNTER — Encounter (HOSPITAL_COMMUNITY): Admission: RE | Admit: 2024-06-14 | Source: Ambulatory Visit

## 2024-06-14 LAB — BASIC METABOLIC PANEL WITH GFR
Anion gap: 12 (ref 5–15)
BUN: 21 mg/dL (ref 8–23)
CO2: 18 mmol/L — ABNORMAL LOW (ref 22–32)
Calcium: 8.5 mg/dL — ABNORMAL LOW (ref 8.9–10.3)
Chloride: 103 mmol/L (ref 98–111)
Creatinine, Ser: 0.98 mg/dL (ref 0.44–1.00)
GFR, Estimated: 58 mL/min — ABNORMAL LOW (ref >60)
Glucose, Bld: 110 mg/dL — ABNORMAL HIGH (ref 70–99)
Potassium: 3.6 mmol/L (ref 3.5–5.1)
Sodium: 133 mmol/L — ABNORMAL LOW (ref 135–145)

## 2024-06-14 LAB — URINALYSIS, ROUTINE W REFLEX MICROSCOPIC
Bilirubin Urine: NEGATIVE
Glucose, UA: NEGATIVE mg/dL
Hgb urine dipstick: NEGATIVE
Ketones, ur: 5 mg/dL — AB
Leukocytes,Ua: NEGATIVE
Nitrite: NEGATIVE
Protein, ur: NEGATIVE mg/dL
Specific Gravity, Urine: 1.005 (ref 1.005–1.030)
pH: 5 (ref 5.0–8.0)

## 2024-06-14 LAB — RESP PANEL BY RT-PCR (RSV, FLU A&B, COVID)  RVPGX2
Influenza A by PCR: NEGATIVE
Influenza B by PCR: NEGATIVE
Resp Syncytial Virus by PCR: NEGATIVE
SARS Coronavirus 2 by RT PCR: NEGATIVE

## 2024-06-14 MED ORDER — SODIUM CHLORIDE 0.9 % IV BOLUS
1000.0000 mL | Freq: Once | INTRAVENOUS | Status: AC
  Administered 2024-06-14: 1000 mL via INTRAVENOUS

## 2024-06-14 MED ORDER — ACETAMINOPHEN 325 MG PO TABS
650.0000 mg | ORAL_TABLET | Freq: Once | ORAL | Status: AC
  Administered 2024-06-14: 650 mg via ORAL
  Filled 2024-06-14: qty 2

## 2024-06-14 NOTE — ED Provider Notes (Signed)
 [place] EMERGENCY DEPARTMENT AT [facility] Provider Note   CSN: [id number] Arrival date & time: 06/13/24  1646     Patient presents with: Fever, Chills, and Dental Problem   Gina Harrell is a 83 y.o. female.   83 year old female presents to the ED with complaints of fever, diarrhea, foul urinary odor. Fever started 6 days ago.  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 Patient noted diarrhea and foul odor to urine 4 days ago.  Patient was seen at the dentist on Thursday for fitting for a new bridge.  A temporary bridge was placed and the patient reported that night she became febrile with no other symptoms.  She continued to run a fever at home with readings between 99 and 101 since onset on Thursday.  Patient has used over-the-counter Tylenol  PM with relief of symptoms.  She noted 1 episode of diarrhea on Saturday and foul odor to her urine at the same time.  Patient was seen at urgent care and was given antibiotics on Monday and lab work taken.  They reported the lab work could not be processed and to go to the ED.   Fever Associated symptoms: chills and diarrhea   Associated symptoms: no chest pain, no dysuria, no nausea and no vomiting        Prior to Admission medications   Medication Sig Start Date End Date Taking? Authorizing Provider  albuterol  (VENTOLIN  HFA) 108 (90 Base) MCG/ACT inhaler Inhale 1 puff into the lungs every 4 (four) hours as needed for shortness of breath. 06/04/23   [provider]  ALPRAZolam  (XANAX ) 0.5 MG tablet Take 0.5 mg by mouth at bedtime.     [provider]  amoxicillin (AMOXIL) 500 MG capsule Take 2,000 mg by mouth See admin instructions. Take 1 hour prior to dental visit 12/13/23   [provider]  aspirin  81 MG chewable tablet Chew 1 tablet (81 mg total) by  mouth daily. 04/20/24   Sebastian Lamarr SAUNDERS, PA-C  Cholecalciferol 50 MCG (2000 UT) CAPS Take 2,000 Units by mouth daily.    [provider]  CRANBERRY EXTRACT PO Take 2 capsules by mouth daily.    [provider]  diphenhydramine-acetaminophen  (TYLENOL  PM) 25-500 MG TABS Take 2 tablets by mouth at bedtime. Reported on 12/26/2015    [provider]  escitalopram  (LEXAPRO ) 10 MG tablet Take 5 mg by mouth at bedtime. 08/02/22   [provider]  furosemide  (LASIX ) 20 MG tablet Take 1 tablet (20 mg total) by mouth as needed for fluid or edema. 04/24/24   Sebastian Lamarr SAUNDERS, PA-C  levothyroxine  (SYNTHROID , LEVOTHROID) 50 MCG tablet Take 50 mcg by mouth daily before breakfast.    [provider]  losartan  (COZAAR ) 50 MG tablet Take 1 tablet (50 mg total) by mouth daily. 06/01/24   Meng, Hao, PA  rosuvastatin  (CRESTOR ) 20 MG tablet Take 20 mg by mouth daily.    [provider]    Allergies: Patient has no known allergies.    Review of Systems  Constitutional:  Positive for activity change, appetite change, chills, fatigue and fever. Negative for diaphoresis.  HENT:  Positive for dental problem.   Respiratory:  Negative for shortness of breath.   Cardiovascular:  Negative for chest pain.  Gastrointestinal:  Positive for diarrhea. Negative for blood in stool, constipation, nausea and vomiting.  Genitourinary:  Negative for decreased urine volume, dyspareunia, dysuria, frequency and urgency.  Neurological:  Positive for weakness. Negative for dizziness, light-headedness and numbness.    Updated Vital Signs BP (!) 141/111   Pulse 80   Temp 98.9 F (37.2 C)   Resp 20   Ht 4' 11 (1.499 m)   Wt 50 kg   SpO2 99%   BMI 22.26 kg/m   Physical Exam Constitutional:      Appearance: Normal appearance.  HENT:     Head: Normocephalic and atraumatic.  Eyes:     Pupils: Pupils are equal, round, and reactive to light.  Cardiovascular:     Rate and  Rhythm: Normal rate.     Heart sounds: Normal heart sounds.  Pulmonary:     Effort: Pulmonary effort is normal.     Breath sounds: Normal breath sounds.  Abdominal:     Tenderness: There is no abdominal tenderness. There is no right CVA tenderness, left CVA tenderness or guarding.  Skin:    General: Skin is warm and dry.  Neurological:     General: No focal deficit present.     Mental Status: She is alert and oriented to person, place, and time.  Psychiatric:        Mood and Affect: Mood normal.        Behavior: Behavior normal.     (all labs ordered are listed, but only abnormal results are displayed) Labs Reviewed  CBC WITH DIFFERENTIAL/PLATELET - Abnormal; Notable for the following components:      Result Value   Platelets 128 (*)    All other components within normal limits  BASIC METABOLIC PANEL WITH GFR - Abnormal; Notable for the following components:   Sodium 132 (*)    CO2 18 (*)    Glucose, Bld 103 (*)    Creatinine, Ser 1.45 (*)    GFR, Estimated 36 (*)    All other components within normal limits  URINALYSIS, ROUTINE W REFLEX MICROSCOPIC - Abnormal; Notable for the following components:   Color, Urine STRAW (*)    Ketones, ur 5 (*)    All other components within normal limits  BASIC METABOLIC PANEL WITH GFR - Abnormal; Notable for the following components:   Sodium 133 (*)    CO2 18 (*)    Glucose, Bld 110 (*)    Calcium  8.5 (*)    GFR, Estimated 58 (*)    All other components within normal limits  RESP PANEL BY RT-PCR (RSV, FLU A&B, COVID)  RVPGX2    EKG: None  Radiology: No results found.   Procedures   Medications Ordered in the ED  sodium chloride  0.9 % bolus 1,000 mL (0 mLs Intravenous Stopped 06/14/24 0310)  acetaminophen  (TYLENOL ) tablet 650 mg (650 mg Oral Given 06/14/24 0357)                                   Medical Decision Making 83 year old female presents to the ED with complaints of fever, diarrhea, foul odor to urine.  Patient has  been placed on prophylactic antibiotic for her dental procedures on Thursday due to aortic valve replacement.  Urgent care placed her on antibiotic on Monday due to fever.  Initial BMP was significant for elevated creatinine, lower GFR than her baseline, and low CO2 which is her baseline due to COPD.  Patient has chronic kidney disease.  CBC has no elevated white  count and UA not significant for infection.  Due to patient's illness she has had decreased fluid intake.  Concern for acute on chronic kidney injury.  After 1 L fluid BMP was rechecked with improved creatinine and GFR at her baseline.  Patient does not fit SIRS criteria or any minor criteria for infective endocarditis.  Patient advised to continue to drink fluids at home for kidney function and take Tylenol  as needed for fever.  Patient advised to follow-up with PCP and return to ED if symptoms worsen.  Patient comfortable with treatment plan and comfortable for discharge.  Amount and/or Complexity of Data Reviewed Labs: ordered.  Risk OTC drugs.       Final diagnoses:  Fever, unspecified fever cause  Chills  AKI (acute kidney injury) [facility])    ED Discharge Orders     None          Myriam Fonda GORMAN DEVONNA 06/14/24 2325    Trine Raynell Moder, MD 06/15/24 [phone]

## 2024-06-14 NOTE — Discharge Instructions (Addendum)
 Rest, drink fluids at home, follow up with PCP, and return to ED if symptoms worsen

## 2024-06-15 ENCOUNTER — Telehealth (HOSPITAL_COMMUNITY): Payer: Self-pay | Admitting: *Deleted

## 2024-06-15 NOTE — Telephone Encounter (Signed)
 Spoke with Lonell she is still running a low grad fever. Lonell spent the night in the emergency room. Lonell is to call her PCP to be evaluated per ED instructions. Will cancel next week's appointments. Patient reminded to be symptom free for 48 hours before returning to group exercise. Patient states understanding.Hadassah Elpidio Quan RN BSN

## 2024-06-16 ENCOUNTER — Encounter (HOSPITAL_COMMUNITY): Payer: Self-pay | Admitting: *Deleted

## 2024-06-16 ENCOUNTER — Encounter (HOSPITAL_COMMUNITY)

## 2024-06-16 DIAGNOSIS — Z952 Presence of prosthetic heart valve: Secondary | ICD-10-CM

## 2024-06-16 NOTE — Progress Notes (Signed)
 Cardiac Individual Treatment Plan  Patient Details  Name: Gina Harrell MRN: [id number] Date of Birth: 05/20/1941 Referring Provider:   Flowsheet Row INTENSIVE CARDIAC REHAB ORIENT from 05/18/2024 in [facility]  Referring Provider Dr. Jerel Balding, MD    Initial Encounter Date:  Flowsheet Row INTENSIVE CARDIAC REHAB ORIENT from 05/18/2024 in [facility]  Date 05/18/24    Visit Diagnosis: S/P TAVR (transcatheter aortic valve replacement)  Patient's Home Medications on Admission:  Current Outpatient Medications:    albuterol  (VENTOLIN  HFA) 108 (90 Base) MCG/ACT inhaler, Inhale 1 puff into the lungs every 4 (four) hours as needed for shortness of breath., Disp: , Rfl:    ALPRAZolam  (XANAX ) 0.5 MG tablet, Take 0.5 mg by mouth at bedtime. , Disp: , Rfl:    amoxicillin (AMOXIL) 500 MG capsule, Take 2,000 mg by mouth See admin instructions. Take 1 hour prior to dental visit, Disp: , Rfl:    aspirin  81 MG chewable tablet, Chew 1 tablet (81 mg total) by mouth daily., Disp: , Rfl:    Cholecalciferol 50 MCG (2000 UT) CAPS, Take 2,000 Units by mouth daily., Disp: , Rfl:    CRANBERRY EXTRACT PO, Take 2 capsules by mouth daily., Disp: , Rfl:    diphenhydramine-acetaminophen  (TYLENOL  PM) 25-500 MG TABS, Take 2 tablets by mouth at bedtime. Reported on 12/26/2015, Disp: , Rfl:    escitalopram  (LEXAPRO ) 10 MG tablet, Take 5 mg by mouth at bedtime., Disp: , Rfl:    furosemide  (LASIX ) 20 MG tablet, Take 1 tablet (20 mg total) by mouth as needed for fluid or edema., Disp: 60 tablet, Rfl: 11   levothyroxine  (SYNTHROID , LEVOTHROID) 50 MCG tablet, Take 50 mcg by mouth daily before breakfast., Disp: , Rfl:    losartan  (COZAAR ) 50 MG tablet, Take 1 tablet (50 mg total) by mouth daily., Disp: 90 tablet, Rfl: 3   rosuvastatin  (CRESTOR ) 20 MG tablet, Take 20 mg by mouth daily., Disp: , Rfl:    Past Medical History: Past Medical History:  Diagnosis Date   Bladder cancer (HCC)    COPD (chronic obstructive pulmonary disease) (HCC)    Elevated cholesterol    GERD (gastroesophageal reflux disease)    Hypertension    Hypothyroidism    Osteoporosis    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 Wendel and Dr. Shyrl.   Severe aortic stenosis    Uterine prolapse     Tobacco Use: Social History   Tobacco Use  Smoking Status Every Day   Current packs/day: 0.50   Average packs/day: 0.5 packs/day for 50.0 years (25.0 ttl pk-yrs)   Types: Cigarettes  Smokeless Tobacco Never  Tobacco Comments   03/20/2024 Patient smokes 8 cigarettes daily   01/03/2024 Patient states one pack last about three days    Labs: Review Flowsheet       Latest Ref Rng & Units 01/31/2024 04/18/2024  Labs for ITP Cardiac and Pulmonary Rehab  PH, Arterial 7.35 - 7.45 7.340  -  PCO2 arterial 32 - 48 mmHg 36.5  -  Bicarbonate 20.0 - 28.0 mmol/L 18.6  22.5  19.7  -  TCO2 22 - 32 mmol/L 20  24  21  22    Acid-base deficit 0.0 - 2.0 mmol/L 9.0  4.0  5.0  -  O2 Saturation % 55  61  92  -    Details       Multiple values from one day are sorted in reverse-chronological order         Capillary Blood Glucose: Lab Results  Component Value Date   GLUCAP 109 (H) 04/19/2024     Exercise Target Goals: Exercise Program Goal: Individual exercise prescription set using results from initial 6 min walk test and THRR while considering  patient's activity barriers and safety.   Exercise Prescription Goal: Initial exercise prescription builds to 30-45 minutes a day of aerobic activity, 2-3 days per week.  Home exercise guidelines will be given to patient during program as part of exercise prescription that the participant will acknowledge.  Activity Barriers & Risk Stratification:  Activity Barriers & Cardiac Risk  Stratification - 05/18/24 1532       Activity Barriers & Cardiac Risk Stratification   Activity Barriers Arthritis;Deconditioning;Back Problems;Neck/Spine Problems;History of Falls;Balance Concerns;Shortness of Breath    Cardiac Risk Stratification High          6 Minute Walk:  6 Minute Walk     Row Name 05/18/24 1506         6 Minute Walk   Phase Initial     Distance 1560 feet     Walk Time 6 minutes     # of Rest Breaks 0     MPH 2.95     METS 2.67     RPE 11     Perceived Dyspnea  1     VO2 Peak 9.36     Symptoms Yes (comment)     Comments Right hip pain 2/10 resolved with rest     Resting HR 59 bpm     Resting BP 122/80     Max Ex. HR 85 bpm     Max Ex. BP 134/82     2 Minute Post BP 128/78        Oxygen Initial Assessment:   Oxygen Re-Evaluation:   Oxygen Discharge (Final Oxygen Re-Evaluation):   Initial Exercise Prescription:  Initial Exercise Prescription - 05/18/24 1500       Date of Initial Exercise RX and Referring Provider   Date 05/18/24    Referring Provider Dr. Jerel Balding, MD    Expected Discharge Date 08/09/24      NuStep   Level 1    SPM 80    Minutes 15    METs 1.8      Track   Laps 15    Minutes 15    METs 2.2      Prescription Details   Frequency (times per week) 2    Duration Progress to 30 minutes of continuous aerobic without signs/symptoms of physical distress      Intensity   THRR 40-80% of Max Heartrate 56-111    Ratings of Perceived Exertion 11-13    Perceived Dyspnea 0-4      Progression   Progression Continue to progress workloads to maintain intensity without signs/symptoms of physical distress.      Resistance Training   Training Prescription Yes    Weight 2    Reps 10-15          Perform Capillary Blood Glucose checks as needed.  Exercise Prescription Changes:   Exercise Prescription Changes     Row Name 05/24/24 1627             Response to Exercise   Blood Pressure (Admit) 142/80        Blood Pressure (  Exercise) 160/68       Blood Pressure (Exit) 120/78       Heart Rate (Admit) 59 bpm       Heart Rate (Exercise) 104 bpm       Heart Rate (Exit) 66 bpm       Rating of Perceived Exertion (Exercise) 11       Perceived Dyspnea (Exercise) 0       Symptoms none       Comments Pt first day in hte [facility] ICR program.       Duration Progress to 30 minutes of  aerobic without signs/symptoms of physical distress       Intensity THRR unchanged         Progression   Progression Continue to progress workloads to maintain intensity without signs/symptoms of physical distress.       Average METs 2.36         Resistance Training   Training Prescription No       Weight 2       Reps 10-15       Time 10 Minutes         NuStep   Level 1       SPM 66       Minutes 15       METs 1.8         Track   Laps 15       Minutes 15       METs 2.91          Exercise Comments:   Exercise Comments     Row Name 05/24/24 1632 06/15/24 1241         Exercise Comments Pt first day in the [facility] ICR program. Pt tolerated exercise well with an average MET level of 2.36. Pt is off to a good start and is learning her THRR, RPE and ExRx Patient absent since 06/05/24. Recent ED visit, will review education when pt returns and is cleared for exercise         Exercise Goals and Review:   Exercise Goals     Row Name 05/18/24 1516             Exercise Goals   Increase Physical Activity Yes       Intervention Provide advice, education, support and counseling about physical activity/exercise needs.;Develop an individualized exercise prescription for aerobic and resistive training based on initial evaluation findings, risk stratification, comorbidities and participant's personal goals.       Expected Outcomes Short Term: Attend rehab on a regular basis to increase amount of physical activity.;Long Term: Exercising regularly at least 3-5 days a week.;Long Term: Add in home exercise to  make exercise part of routine and to increase amount of physical activity.       Increase Strength and Stamina Yes       Intervention Provide advice, education, support and counseling about physical activity/exercise needs.;Develop an individualized exercise prescription for aerobic and resistive training based on initial evaluation findings, risk stratification, comorbidities and participant's personal goals.       Expected Outcomes Short Term: Increase workloads from initial exercise prescription for resistance, speed, and METs.;Short Term: Perform resistance training exercises routinely during rehab and add in resistance training at home;Long Term: Improve cardiorespiratory fitness, muscular endurance and strength as measured by increased METs and functional capacity ( )       Able to understand and use rate of perceived exertion (RPE) scale Yes  Intervention Provide education and explanation on how to use RPE scale       Expected Outcomes Short Term: Able to use RPE daily in rehab to express subjective intensity level;Long Term:  Able to use RPE to guide intensity level when exercising independently       Knowledge and understanding of Target Heart Rate Range (THRR) Yes       Intervention Provide education and explanation of THRR including how the numbers were predicted and where they are located for reference       Expected Outcomes Short Term: Able to state/look up THRR;Long Term: Able to use THRR to govern intensity when exercising independently;Short Term: Able to use daily as guideline for intensity in rehab       Understanding of Exercise Prescription Yes       Intervention Provide education, explanation, and written materials on patient's individual exercise prescription       Expected Outcomes Short Term: Able to explain program exercise prescription;Long Term: Able to explain home exercise prescription to exercise independently          Exercise Goals Re-Evaluation :  Exercise  Goals Re-Evaluation     Row Name 05/24/24 1631             Exercise Goal Re-Evaluation   Exercise Goals Review Increase Physical Activity;Understanding of Exercise Prescription;Increase Strength and Stamina;Knowledge and understanding of Target Heart Rate Range (THRR);Able to understand and use rate of perceived exertion (RPE) scale       Comments Pt first day in the [facility] I[facility] program. Pt tolerated exercise well with an average MET level of 2.36. Pt is off to a good start and is learning her THRR, RPE and ExRx       Expected Outcomes will continue to monitor pt and progress workloads as tolerated without sign or symptom          Discharge Exercise Prescription (Final Exercise Prescription Changes):  Exercise Prescription Changes - 05/24/24 1627       Response to Exercise   Blood Pressure (Admit) 142/80    Blood Pressure (Exercise) 160/68    Blood Pressure (Exit) 120/78    Heart Rate (Admit) 59 bpm    Heart Rate (Exercise) 104 bpm    Heart Rate (Exit) 66 bpm    Rating of Perceived Exertion (Exercise) 11    Perceived Dyspnea (Exercise) 0    Symptoms none    Comments Pt first day in hte [facility] ICR program.    Duration Progress to 30 minutes of  aerobic without signs/symptoms of physical distress    Intensity THRR unchanged      Progression   Progression Continue to progress workloads to maintain intensity without signs/symptoms of physical distress.    Average METs 2.36      Resistance Training   Training Prescription No    Weight 2    Reps 10-15    Time 10 Minutes      NuStep   Level 1    SPM 66    Minutes 15    METs 1.8      Track   Laps 15    Minutes 15    METs 2.91          Nutrition:  Target Goals: Understanding of nutrition guidelines, daily intake of sodium 1500mg , cholesterol 200mg , calories 30% from fat and 7% or less from saturated fats, daily to have 5 or more servings of fruits and vegetables.  Biometrics:  Pre Biometrics - 05/18/24  1501        Pre Biometrics   Waist Circumference 31 inches    Hip Circumference 37.25 inches    Waist to Hip Ratio 0.83 %    Triceps Skinfold 13 mm    % Body Fat 32.7 %    Grip Strength 28 kg    Flexibility 14 in    Single Leg Stand 11 seconds           Nutrition Therapy Plan and Nutrition Goals:  Nutrition Therapy & Goals - 05/25/24 1523       Nutrition Therapy   Diet Heart healthy diet    Drug/Food Interactions Statins/Certain Fruits      Personal Nutrition Goals   Nutrition Goal Patient to identify strategies for reducing cardiovascular risk by attending the [facility] education and nutrition series weekly.    Personal Goal #2 Patient to improve diet quality by using the plate method as a guide for meal planning to include lean protein/plant protein, fruits, vegetables, whole grains, nonfat dairy as part of a well-balanced diet.    Comments Patient has medical history of HTN, aortic atherosclerosis, tobacco abuse, s/p TAVR. Patient has maintained 112-116# for >10 years. Patient will benefit from participation in intensive cardiac rehab for nutrition education, exercise, and lifestyle modification.      Intervention Plan   Intervention Prescribe, educate and counsel regarding individualized specific dietary modifications aiming towards targeted core components such as weight, hypertension, lipid management, diabetes, heart failure and other comorbidities.;Nutrition handout(s) given to patient.    Expected Outcomes Short Term Goal: Understand basic principles of dietary content, such as calories, fat, sodium, cholesterol and nutrients.;Long Term Goal: Adherence to prescribed nutrition plan.          Nutrition Assessments:  Nutrition Assessments - 05/25/24 1107       Rate Your Plate Scores   Pre Score 54         MEDIFICTS Score Key: >=70 Need to make dietary changes  40-70 Heart Healthy Diet <= 40 Therapeutic Level Cholesterol Diet   Flowsheet Row INTENSIVE CARDIAC REHAB  from 05/24/2024 in [facility]  Picture Your Plate Total Score on Admission 54   Picture Your Plate Scores: <59 Unhealthy dietary pattern with much room for improvement. 41-50 Dietary pattern unlikely to meet recommendations for good health and room for improvement. 51-60 More healthful dietary pattern, with some room for improvement.  >60 Healthy dietary pattern, although there may be some specific behaviors that could be improved.    Nutrition Goals Re-Evaluation:  Nutrition Goals Re-Evaluation     Row Name 05/25/24 1523             Goals   Current Weight 112 lb 10.5 oz (51.1 kg)       Comment labs WNL, no recent lipid panel available for review       Expected Outcome Patient has medical history of HTN, aortic atherosclerosis, tobacco abuse, s/p TAVR. Patient has maintained 112-116# for >10 years. Patient will benefit from participation in intensive cardiac rehab for nutrition education, exercise, and lifestyle modification.          Nutrition Goals Re-Evaluation:  Nutrition Goals Re-Evaluation     Row Name 05/25/24 1523             Goals   Current Weight 112 lb 10.5 oz (51.1 kg)       Comment labs WNL, no recent lipid panel available for review  Expected Outcome Patient has medical history of HTN, aortic atherosclerosis, tobacco abuse, s/p TAVR. Patient has maintained 112-116# for >10 years. Patient will benefit from participation in intensive cardiac rehab for nutrition education, exercise, and lifestyle modification.          Nutrition Goals Discharge (Final Nutrition Goals Re-Evaluation):  Nutrition Goals Re-Evaluation - 05/25/24 1523       Goals   Current Weight 112 lb 10.5 oz (51.1 kg)    Comment labs WNL, no recent lipid panel available for review    Expected Outcome Patient has medical history of HTN, aortic atherosclerosis, tobacco abuse, s/p TAVR. Patient has maintained 112-116# for >10 years.  Patient will benefit from participation in intensive cardiac rehab for nutrition education, exercise, and lifestyle modification.          Psychosocial: Target Goals: Acknowledge presence or absence of significant depression and/or stress, maximize coping skills, provide positive support system. Participant is able to verbalize types and ability to use techniques and skills needed for reducing stress and depression.  Initial Review & Psychosocial Screening:  Initial Psych Review & Screening - 05/18/24 1518       Initial Review   Current issues with Current Anxiety/Panic;Current Stress Concerns    Comments Some work related stress      Family Dynamics   Good Support System? Yes      Barriers   Psychosocial barriers to participate in program The patient should benefit from training in stress management and relaxation.      Screening Interventions   Interventions Encouraged to exercise;To provide support and resources with identified psychosocial needs;Provide feedback about the scores to participant    Expected Outcomes Long Term Goal: Stressors or current issues are controlled or eliminated.;Short Term goal: Identification and review with participant of any Quality of Life or Depression concerns found by scoring the questionnaire.;Long Term goal: The participant improves quality of Life and PHQ9 Scores as seen by post scores and/or verbalization of changes          Quality of Life Scores:  Quality of Life - 05/18/24 1518       Quality of Life   Select Quality of Life      Quality of Life Scores   Health/Function Pre 25.92 %    Socioeconomic Pre 28.5 %    Psych/Spiritual Pre 28.79 %    Family Pre 30 %    GLOBAL Pre 27.54 %         Scores of 19 and below usually indicate a poorer quality of life in these areas.  A difference of  2-3 points is a clinically meaningful difference.  A difference of 2-3 points in the total score of the Quality of Life Index has been associated  with significant improvement in overall quality of life, self-image, physical symptoms, and general health in studies assessing change in quality of life.  PHQ-9: Review Flowsheet       05/18/2024 01/06/2016 12/26/2015  Depression screen PHQ 2/9  Decreased Interest 0 0 0  Down, Depressed, Hopeless 0 0 0  PHQ - 2 Score 0 0 0  Altered sleeping 0 - -  Tired, decreased energy 2 - -  Change in appetite 0 - -  Feeling bad or failure about yourself  0 - -  Trouble concentrating 0 - -  Moving slowly or fidgety/restless 0 - -  Suicidal thoughts 0 - -  PHQ-9 Score 2 - -  Difficult doing work/chores Not difficult at all - -  Interpretation of Total Score  Total Score Depression Severity:  1-4 = Minimal depression, 5-9 = Mild depression, 10-14 = Moderate depression, 15-19 = Moderately severe depression, 20-27 = Severe depression   Psychosocial Evaluation and Intervention:   Psychosocial Re-Evaluation:  Psychosocial Re-Evaluation     Row Name 05/24/24 1637 06/16/24 9177           Psychosocial Re-Evaluation   Current issues with Current Anxiety/Panic;Current Stress Concerns Current Anxiety/Panic;Current Stress Concerns      Comments Pt started cardiac rehab today, stating she was fatigued approximately more than half the week.  No other psychosocial concerns voiced at this time Jackie's last exercise at cardiac rehab was on 06/05/24.Will reassess when Lonell returns to exercise.      Expected Outcomes Pt will have controlled/decreased anxiety/stressors by the time of cardiac rehab completion. Pt will have controlled/decreased anxiety/stressors by the time of cardiac rehab completion.      Interventions Stress management education;Relaxation education;Encouraged to attend Cardiac Rehabilitation for the exercise Stress management education;Relaxation education;Encouraged to attend Cardiac Rehabilitation for the exercise      Continue Psychosocial Services  No Follow up required No Follow up  required        Initial Review   Source of Stress Concerns Occupation Occupation      Comments Will continue to monitor and offer assistance as needed. Will continue to monitor and offer assistance as needed.         Psychosocial Discharge (Final Psychosocial Re-Evaluation):  Psychosocial Re-Evaluation - 06/16/24 9177       Psychosocial Re-Evaluation   Current issues with Current Anxiety/Panic;Current Stress Concerns    Comments Jackie's last exercise at cardiac rehab was on 06/05/24.Will reassess when Lonell returns to exercise.    Expected Outcomes Pt will have controlled/decreased anxiety/stressors by the time of cardiac rehab completion.    Interventions Stress management education;Relaxation education;Encouraged to attend Cardiac Rehabilitation for the exercise    Continue Psychosocial Services  No Follow up required      Initial Review   Source of Stress Concerns Occupation    Comments Will continue to monitor and offer assistance as needed.          Vocational Rehabilitation: Provide vocational rehab assistance to qualifying candidates.   Vocational Rehab Evaluation & Intervention:  Vocational Rehab - 05/18/24 1523       Initial Vocational Rehab Evaluation & Intervention   Assessment shows need for Vocational Rehabilitation No   Patient is abck at work. No needs at this time         Education: Education Goals: Education classes will be provided on a weekly basis, covering required topics. Participant will state understanding/return demonstration of topics presented.    Education     Row Name 05/24/24 1500     Education   Cardiac Education Topics [facility]   Orthoptist   Educator Dietitian   Weekly Topic Powerhouse Plant-Based Proteins   Instruction Review Code 1- Verbalizes Understanding   Class Start Time 1400   Class Stop Time 1440   Class Time Calculation (min) 40 min    Row Name 05/29/24 1400     Education    Cardiac Education Topics [facility]   Geographical information systems officer Psychosocial   Psychosocial Workshop From Head to Heart: The Power of a Healthy Outlook   Instruction Review Code 1- Verbalizes Understanding   Class Start Time 1400  Class Stop Time 1445   Class Time Calculation (min) 45 min    Row Name 05/31/24 1500     Education   Cardiac Education Topics [facility]   Customer service manager   Weekly Topic Tasty Appetizers and Snacks   Instruction Review Code 1- Verbalizes Understanding   Class Start Time 1400   Class Stop Time 1440   Class Time Calculation (min) 40 min    Row Name 06/05/24 1400     Education   Cardiac Education Topics [facility]   Nurse, children's Exercise Physiologist   Select Psychosocial   Psychosocial Healthy Minds, Bodies, Hearts   Instruction Review Code 1- Verbalizes Understanding   Class Start Time 1415   Class Stop Time 1450   Class Time Calculation (min) 35 min      Core Videos: Exercise    Move It!  Clinical staff conducted group or individual video education with verbal and written material and guidebook.  Patient learns the recommended [facility] exercise program. Exercise with the goal of living a long, healthy life. Some of the health benefits of exercise include controlled diabetes, healthier blood pressure levels, improved cholesterol levels, improved heart and lung capacity, improved sleep, and better body composition. Everyone should speak with their doctor before starting or changing an exercise routine.  Biomechanical Limitations Clinical staff conducted group or individual video education with verbal and written material and guidebook.  Patient learns how biomechanical limitations can impact exercise and how we can mitigate and possibly overcome limitations to have an impactful and balanced exercise routine.  Body  Composition Clinical staff conducted group or individual video education with verbal and written material and guidebook.  Patient learns that body composition (ratio of muscle mass to fat mass) is a key component to assessing overall fitness, rather than body weight alone. Increased fat mass, especially visceral belly fat, can put us  at increased risk for metabolic syndrome, type 2 diabetes, heart disease, and even death. It is recommended to combine diet and exercise (cardiovascular and resistance training) to improve your body composition. Seek guidance from your physician and exercise physiologist before implementing an exercise routine.  Exercise Action Plan Clinical staff conducted group or individual video education with verbal and written material and guidebook.  Patient learns the recommended strategies to achieve and enjoy long-term exercise adherence, including variety, self-motivation, self-efficacy, and positive decision making. Benefits of exercise include fitness, good health, weight management, more energy, better sleep, less stress, and overall well-being.  Medical   Heart Disease Risk Reduction Clinical staff conducted group or individual video education with verbal and written material and guidebook.  Patient learns our heart is our most vital organ as it circulates oxygen, nutrients, white blood cells, and hormones throughout the entire body, and carries waste away. Data supports a plant-based eating plan like the Pritikin Program for its effectiveness in slowing progression of and reversing heart disease. The video provides a number of recommendations to address heart disease.   Metabolic Syndrome and Belly Fat  Clinical staff conducted group or individual video education with verbal and written material and guidebook.  Patient learns what metabolic syndrome is, how it leads to heart disease, and how one can reverse it and keep it from coming back. You have metabolic syndrome if  you have 3 of the following 5 criteria: abdominal obesity, high blood pressure, high triglycerides, low HDL cholesterol, and high  blood sugar.  Hypertension and Heart Disease Clinical staff conducted group or individual video education with verbal and written material and guidebook.  Patient learns that high blood pressure, or hypertension, is very common in the [place] . Hypertension is largely due to excessive salt intake, but other important risk factors include being overweight, physical inactivity, drinking too much alcohol, smoking, and not eating enough potassium from fruits and vegetables. High blood pressure is a leading risk factor for heart attack, stroke, congestive heart failure, dementia, kidney failure, and premature death. Long-term effects of excessive salt intake include stiffening of the arteries and thickening of heart muscle and organ damage. Recommendations include ways to reduce hypertension and the risk of heart disease.  Diseases of Our Time - Focusing on Diabetes Clinical staff conducted group or individual video education with verbal and written material and guidebook.  Patient learns why the best way to stop diseases of our time is prevention, through food and other lifestyle changes. Medicine (such as prescription pills and surgeries) is often only a Band-Aid on the problem, not a long-term solution. Most common diseases of our time include obesity, type 2 diabetes, hypertension, heart disease, and cancer. The Pritikin Program is recommended and has been proven to help reduce, reverse, and/or prevent the damaging effects of metabolic syndrome.  Nutrition   Overview of the Pritikin Eating Plan  Clinical staff conducted group or individual video education with verbal and written material and guidebook.  Patient learns about the Pritikin Eating Plan for disease risk reduction. The Pritikin Eating Plan emphasizes a wide variety of unrefined, minimally-processed  carbohydrates, like fruits, vegetables, whole grains, and legumes. Go, Caution, and Stop food choices are explained. Plant-based and lean animal proteins are emphasized. Rationale provided for low sodium intake for blood pressure control, low added sugars for blood sugar stabilization, and low added fats and oils for coronary artery disease risk reduction and weight management.  Calorie Density  Clinical staff conducted group or individual video education with verbal and written material and guidebook.  Patient learns about calorie density and how it impacts the Pritikin Eating Plan. Knowing the characteristics of the food you choose will help you decide whether those foods will lead to weight gain or weight loss, and whether you want to consume more or less of them. Weight loss is usually a side effect of the Pritikin Eating Plan because of its focus on low calorie-dense foods.  Label Reading  Clinical staff conducted group or individual video education with verbal and written material and guidebook.  Patient learns about the Pritikin recommended label reading guidelines and corresponding recommendations regarding calorie density, added sugars, sodium content, and whole grains.  Dining Out - Part 1  Clinical staff conducted group or individual video education with verbal and written material and guidebook.  Patient learns that restaurant meals can be sabotaging because they can be so high in calories, fat, sodium, and/or sugar. Patient learns recommended strategies on how to positively address this and avoid unhealthy pitfalls.  Facts on Fats  Clinical staff conducted group or individual video education with verbal and written material and guidebook.  Patient learns that lifestyle modifications can be just as effective, if not more so, as many medications for lowering your risk of heart disease. A Pritikin lifestyle can help to reduce your risk of inflammation and atherosclerosis (cholesterol  build-up, or plaque, in the artery walls). Lifestyle interventions such as dietary choices and physical activity address the cause of atherosclerosis. A review of the types  of fats and their impact on blood cholesterol levels, along with dietary recommendations to reduce fat intake is also included.  Nutrition Action Plan  Clinical staff conducted group or individual video education with verbal and written material and guidebook.  Patient learns how to incorporate Pritikin recommendations into their lifestyle. Recommendations include planning and keeping personal health goals in mind as an important part of their success.  Healthy Mind-Set    Healthy Minds, Bodies, Hearts  Clinical staff conducted group or individual video education with verbal and written material and guidebook.  Patient learns how to identify when they are stressed. Video will discuss the impact of that stress, as well as the many benefits of stress management. Patient will also be introduced to stress management techniques. The way we think, act, and feel has an impact on our hearts.  How Our Thoughts Can Heal Our Hearts  Clinical staff conducted group or individual video education with verbal and written material and guidebook.  Patient learns that negative thoughts can cause depression and anxiety. This can result in negative lifestyle behavior and serious health problems. Cognitive behavioral therapy is an effective method to help control our thoughts in order to change and improve our emotional outlook.  Additional Videos:  Exercise    Improving Performance  Clinical staff conducted group or individual video education with verbal and written material and guidebook.  Patient learns to use a non-linear approach by alternating intensity levels and lengths of time spent exercising to help burn more calories and lose more body fat. Cardiovascular exercise helps improve heart health, metabolism, hormonal balance, blood sugar  control, and recovery from fatigue. Resistance training improves strength, endurance, balance, coordination, reaction time, metabolism, and muscle mass. Flexibility exercise improves circulation, posture, and balance. Seek guidance from your physician and exercise physiologist before implementing an exercise routine and learn your capabilities and proper form for all exercise.  Introduction to Yoga  Clinical staff conducted group or individual video education with verbal and written material and guidebook.  Patient learns about yoga, a discipline of the coming together of mind, breath, and body. The benefits of yoga include improved flexibility, improved range of motion, better posture and core strength, increased lung function, weight loss, and positive self-image. Yoga's heart health benefits include lowered blood pressure, healthier heart rate, decreased cholesterol and triglyceride levels, improved immune function, and reduced stress. Seek guidance from your physician and exercise physiologist before implementing an exercise routine and learn your capabilities and proper form for all exercise.  Medical   Aging: Enhancing Your Quality of Life  Clinical staff conducted group or individual video education with verbal and written material and guidebook.  Patient learns key strategies and recommendations to stay in good physical health and enhance quality of life, such as prevention strategies, having an advocate, securing a Health Care Proxy and Power of Attorney, and keeping a list of medications and system for tracking them. It also discusses how to avoid risk for bone loss.  Biology of Weight Control  Clinical staff conducted group or individual video education with verbal and written material and guidebook.  Patient learns that weight gain occurs because we consume more calories than we burn (eating more, moving less). Even if your body weight is normal, you may have higher ratios of fat compared to  muscle mass. Too much body fat puts you at increased risk for cardiovascular disease, heart attack, stroke, type 2 diabetes, and obesity-related cancers. In addition to exercise, following the Pritikin Eating Plan can  help reduce your risk.  Decoding Lab Results  Clinical staff conducted group or individual video education with verbal and written material and guidebook.  Patient learns that lab test reflects one measurement whose values change over time and are influenced by many factors, including medication, stress, sleep, exercise, food, hydration, pre-existing medical conditions, and more. It is recommended to use the knowledge from this video to become more involved with your lab results and evaluate your numbers to speak with your doctor.   Diseases of Our Time - Overview  Clinical staff conducted group or individual video education with verbal and written material and guidebook.  Patient learns that according to the CDC, 50% to 70% of chronic diseases (such as obesity, type 2 diabetes, elevated lipids, hypertension, and heart disease) are avoidable through lifestyle improvements including healthier food choices, listening to satiety cues, and increased physical activity.  Sleep Disorders Clinical staff conducted group or individual video education with verbal and written material and guidebook.  Patient learns how good quality and duration of sleep are important to overall health and well-being. Patient also learns about sleep disorders and how they impact health along with recommendations to address them, including discussing with a physician.  Nutrition  Dining Out - Part 2 Clinical staff conducted group or individual video education with verbal and written material and guidebook.  Patient learns how to plan ahead and communicate in order to maximize their dining experience in a healthy and nutritious manner. Included are recommended food choices based on the type of restaurant the patient  is visiting.   Fueling a Banker conducted group or individual video education with verbal and written material and guidebook.  There is a strong connection between our food choices and our health. Diseases like obesity and type 2 diabetes are very prevalent and are in large-part due to lifestyle choices. The Pritikin Eating Plan provides plenty of food and hunger-curbing satisfaction. It is easy to follow, affordable, and helps reduce health risks.  Menu Workshop  Clinical staff conducted group or individual video education with verbal and written material and guidebook.  Patient learns that restaurant meals can sabotage health goals because they are often packed with calories, fat, sodium, and sugar. Recommendations include strategies to plan ahead and to communicate with the manager, chef, or server to help order a healthier meal.  Planning Your Eating Strategy  Clinical staff conducted group or individual video education with verbal and written material and guidebook.  Patient learns about the Pritikin Eating Plan and its benefit of reducing the risk of disease. The Pritikin Eating Plan does not focus on calories. Instead, it emphasizes high-quality, nutrient-rich foods. By knowing the characteristics of the foods, we choose, we can determine their calorie density and make informed decisions.  Targeting Your Nutrition Priorities  Clinical staff conducted group or individual video education with verbal and written material and guidebook.  Patient learns that lifestyle habits have a tremendous impact on disease risk and progression. This video provides eating and physical activity recommendations based on your personal health goals, such as reducing LDL cholesterol, losing weight, preventing or controlling type 2 diabetes, and reducing high blood pressure.  Vitamins and Minerals  Clinical staff conducted group or individual video education with verbal and written material  and guidebook.  Patient learns different ways to obtain key vitamins and minerals, including through a recommended healthy diet. It is important to discuss all supplements you take with your doctor.   Healthy Mind-Set  Smoking Cessation  Clinical staff conducted group or individual video education with verbal and written material and guidebook.  Patient learns that cigarette smoking and tobacco addiction pose a serious health risk which affects millions of people. Stopping smoking will significantly reduce the risk of heart disease, lung disease, and many forms of cancer. Recommended strategies for quitting are covered, including working with your doctor to develop a successful plan.  Culinary   Becoming a Set designer conducted group or individual video education with verbal and written material and guidebook.  Patient learns that cooking at home can be healthy, cost-effective, quick, and puts them in control. Keys to cooking healthy recipes will include looking at your recipe, assessing your equipment needs, planning ahead, making it simple, choosing cost-effective seasonal ingredients, and limiting the use of added fats, salts, and sugars.  Cooking - Breakfast and Snacks  Clinical staff conducted group or individual video education with verbal and written material and guidebook.  Patient learns how important breakfast is to satiety and nutrition through the entire day. Recommendations include key foods to eat during breakfast to help stabilize blood sugar levels and to prevent overeating at meals later in the day. Planning ahead is also a key component.  Cooking - Educational psychologist conducted group or individual video education with verbal and written material and guidebook.  Patient learns eating strategies to improve overall health, including an approach to cook more at home. Recommendations include thinking of animal protein as a side on your plate rather  than center stage and focusing instead on lower calorie dense options like vegetables, fruits, whole grains, and plant-based proteins, such as beans. Making sauces in large quantities to freeze for later and leaving the skin on your vegetables are also recommended to maximize your experience.  Cooking - Healthy Salads and Dressing Clinical staff conducted group or individual video education with verbal and written material and guidebook.  Patient learns that vegetables, fruits, whole grains, and legumes are the foundations of the Pritikin Eating Plan. Recommendations include how to incorporate each of these in flavorful and healthy salads, and how to create homemade salad dressings. Proper handling of ingredients is also covered. Cooking - Soups and [employer] - Soups and Desserts Clinical staff conducted group or individual video education with verbal and written material and guidebook.  Patient learns that Pritikin soups and desserts make for easy, nutritious, and delicious snacks and meal components that are low in sodium, fat, sugar, and calorie density, while high in vitamins, minerals, and filling fiber. Recommendations include simple and healthy ideas for soups and desserts.   Overview     The Pritikin Solution Program Overview Clinical staff conducted group or individual video education with verbal and written material and guidebook.  Patient learns that the results of the Pritikin Program have been documented in more than 100 articles published in peer-reviewed journals, and the benefits include reducing risk factors for (and, in some cases, even reversing) high cholesterol, high blood pressure, type 2 diabetes, obesity, and more! An overview of the three key pillars of the Pritikin Program will be covered: eating well, doing regular exercise, and having a healthy mind-set.  WORKSHOPS  Exercise: Exercise Basics: Building Your Action Plan Clinical staff led group instruction and  group discussion with PowerPoint presentation and patient guidebook. To enhance the learning environment the use of posters, models and videos may be added. At the conclusion of this workshop, patients will comprehend the difference between  physical activity and exercise, as well as the benefits of incorporating both, into their routine. Patients will understand the FITT (Frequency, Intensity, Time, and Type) principle and how to use it to build an exercise action plan. In addition, safety concerns and other considerations for exercise and cardiac rehab will be addressed by the presenter. The purpose of this lesson is to promote a comprehensive and effective weekly exercise routine in order to improve patients' overall level of fitness.   Managing Heart Disease: Your Path to a Healthier Heart Clinical staff led group instruction and group discussion with PowerPoint presentation and patient guidebook. To enhance the learning environment the use of posters, models and videos may be added.At the conclusion of this workshop, patients will understand the anatomy and physiology of the heart. Additionally, they will understand how Pritikin's three pillars impact the risk factors, the progression, and the management of heart disease.  The purpose of this lesson is to provide a high-level overview of the heart, heart disease, and how the Pritikin lifestyle positively impacts risk factors.  Exercise Biomechanics Clinical staff led group instruction and group discussion with PowerPoint presentation and patient guidebook. To enhance the learning environment the use of posters, models and videos may be added. Patients will learn how the structural parts of their bodies function and how these functions impact their daily activities, movement, and exercise. Patients will learn how to promote a neutral spine, learn how to manage pain, and identify ways to improve their physical movement in order to  promote healthy living. The purpose of this lesson is to expose patients to common physical limitations that impact physical activity. Participants will learn practical ways to adapt and manage aches and pains, and to minimize their effect on regular exercise. Patients will learn how to maintain good posture while sitting, walking, and lifting.  Balance Training and Fall Prevention  Clinical staff led group instruction and group discussion with PowerPoint presentation and patient guidebook. To enhance the learning environment the use of posters, models and videos may be added. At the conclusion of this workshop, patients will understand the importance of their sensorimotor skills (vision, proprioception, and the vestibular system) in maintaining their ability to balance as they age. Patients will apply a variety of balancing exercises that are appropriate for their current level of function. Patients will understand the common causes for poor balance, possible solutions to these problems, and ways to modify their physical environment in order to minimize their fall risk. The purpose of this lesson is to teach patients about the importance of maintaining balance as they age and ways to minimize their risk of falling.  WORKSHOPS   Nutrition:  Fueling a Ship broker led group instruction and group discussion with PowerPoint presentation and patient guidebook. To enhance the learning environment the use of posters, models and videos may be added. Patients will review the foundational principles of the Pritikin Eating Plan and understand what constitutes a serving size in each of the food groups. Patients will also learn Pritikin-friendly foods that are better choices when away from home and review make-ahead meal and snack options. Calorie density will be reviewed and applied to three nutrition priorities: weight maintenance, weight loss, and weight gain. The purpose of this lesson is to  reinforce (in a group setting) the key concepts around what patients are recommended to eat and how to apply these guidelines when away from home by planning and selecting Pritikin-friendly options. Patients will understand how calorie density may be adjusted  for different weight management goals.  Mindful Eating  Clinical staff led group instruction and group discussion with PowerPoint presentation and patient guidebook. To enhance the learning environment the use of posters, models and videos may be added. Patients will briefly review the concepts of the Pritikin Eating Plan and the importance of low-calorie dense foods. The concept of mindful eating will be introduced as well as the importance of paying attention to internal hunger signals. Triggers for non-hunger eating and techniques for dealing with triggers will be explored. The purpose of this lesson is to provide patients with the opportunity to review the basic principles of the Pritikin Eating Plan, discuss the value of eating mindfully and how to measure internal cues of hunger and fullness using the Hunger Scale. Patients will also discuss reasons for non-hunger eating and learn strategies to use for controlling emotional eating.  Targeting Your Nutrition Priorities Clinical staff led group instruction and group discussion with PowerPoint presentation and patient guidebook. To enhance the learning environment the use of posters, models and videos may be added. Patients will learn how to determine their genetic susceptibility to disease by reviewing their family history. Patients will gain insight into the importance of diet as part of an overall healthy lifestyle in mitigating the impact of genetics and other environmental insults. The purpose of this lesson is to provide patients with the opportunity to assess their personal nutrition priorities by looking at their family history, their own health history and current risk factors. Patients will  also be able to discuss ways of prioritizing and modifying the Pritikin Eating Plan for their highest risk areas  Menu  Clinical staff led group instruction and group discussion with PowerPoint presentation and patient guidebook. To enhance the learning environment the use of posters, models and videos may be added. Using menus brought in from [employer], or printed from [employer], patients will apply the Pritikin dining out guidelines that were presented in the [employer] video. Patients will also be able to practice these guidelines in a variety of provided scenarios. The purpose of this lesson is to provide patients with the opportunity to practice hands-on learning of the Pritikin Dining Out guidelines with actual menus and practice scenarios.  Label Reading Clinical staff led group instruction and group discussion with PowerPoint presentation and patient guidebook. To enhance the learning environment the use of posters, models and videos may be added. Patients will review and discuss the Pritikin label reading guidelines presented in Pritikin's Label Reading Educational series video. Using fool labels brought in from local grocery stores and markets, patients will apply the label reading guidelines and determine if the packaged food meet the Pritikin guidelines. The purpose of this lesson is to provide patients with the opportunity to review, discuss, and practice hands-on learning of the Pritikin Label Reading guidelines with actual packaged food labels. Cooking School  Pritikin's [employer] are designed to teach patients ways to prepare quick, simple, and affordable recipes at home. The importance of nutrition's role in chronic disease risk reduction is reflected in its emphasis in the overall Pritikin program. By learning how to prepare essential core Pritikin Eating Plan recipes, patients will increase control over what they eat; be able to customize the  flavor of foods without the use of added salt, sugar, or fat; and improve the quality of the food they consume. By learning a set of core recipes which are easily assembled, quickly prepared, and affordable, patients are more likely to prepare  more healthy foods at home. These workshops focus on convenient breakfasts, simple entres, side dishes, and desserts which can be prepared with minimal effort and are consistent with nutrition recommendations for cardiovascular risk reduction. Cooking [employer] are taught by a Armed forces logistics/support/administrative officer (RD) who has been trained by the [employer]. The chef or RD has a clear understanding of the importance of minimizing - if not completely eliminating - added fat, sugar, and sodium in recipes. Throughout the series of Cooking School Workshop sessions, patients will learn about healthy ingredients and efficient methods of cooking to build confidence in their capability to prepare    Cooking School weekly topics:  Adding Flavor- Sodium-Free  Fast and Healthy Breakfasts  Powerhouse Plant-Based Proteins  Satisfying Salads and Dressings  Simple Sides and Sauces  International Cuisine-Spotlight on the [employer] Zones  Delicious Desserts  Savory Soups  [employer] - Meals in a Astronomer Appetizers and Snacks  Comforting Weekend Breakfasts  One-Pot Wonders   Fast Evening Meals  Landscape architect Your Pritikin Plate  WORKSHOPS   Healthy Mindset (Psychosocial):  Focused Goals, Sustainable Changes Clinical staff led group instruction and group discussion with PowerPoint presentation and patient guidebook. To enhance the learning environment the use of posters, models and videos may be added. Patients will be able to apply effective goal setting strategies to establish at least one personal goal, and then take consistent, meaningful action toward that goal. They will learn to identify common barriers to achieving  personal goals and develop strategies to overcome them. Patients will also gain an understanding of how our mind-set can impact our ability to achieve goals and the importance of cultivating a positive and growth-oriented mind-set. The purpose of this lesson is to provide patients with a deeper understanding of how to set and achieve personal goals, as well as the tools and strategies needed to overcome common obstacles which may arise along the way.  From Head to Heart: The Power of a Healthy Outlook  Clinical staff led group instruction and group discussion with PowerPoint presentation and patient guidebook. To enhance the learning environment the use of posters, models and videos may be added. Patients will be able to recognize and describe the impact of emotions and mood on physical health. They will discover the importance of self-care and explore self-care practices which may work for them. Patients will also learn how to utilize the 4 C's to cultivate a healthier outlook and better manage stress and challenges. The purpose of this lesson is to demonstrate to patients how a healthy outlook is an essential part of maintaining good health, especially as they continue their cardiac rehab journey.  Healthy Sleep for a Healthy Heart Clinical staff led group instruction and group discussion with PowerPoint presentation and patient guidebook. To enhance the learning environment the use of posters, models and videos may be added. At the conclusion of this workshop, patients will be able to demonstrate knowledge of the importance of sleep to overall health, well-being, and quality of life. They will understand the symptoms of, and treatments for, common sleep disorders. Patients will also be able to identify daytime and nighttime behaviors which impact sleep, and they will be able to apply these tools to help manage sleep-related challenges. The purpose of this lesson is to provide patients with a general  overview of sleep and outline the importance of quality sleep. Patients will learn about a few of the most common sleep disorders. Patients will  also be introduced to the concept of "sleep hygiene," and discover ways to self-manage certain sleeping problems through simple daily behavior changes. Finally, the workshop will motivate patients by clarifying the links between quality sleep and their goals of heart-healthy living.   Recognizing and Reducing Stress Clinical staff led group instruction and group discussion with PowerPoint presentation and patient guidebook. To enhance the learning environment the use of posters, models and videos may be added. At the conclusion of this workshop, patients will be able to understand the types of stress reactions, differentiate between acute and chronic stress, and recognize the impact that chronic stress has on their health. They will also be able to apply different coping mechanisms, such as reframing negative self-talk. Patients will have the opportunity to practice a variety of stress management techniques, such as deep abdominal breathing, progressive muscle relaxation, and/or guided imagery.  The purpose of this lesson is to educate patients on the role of stress in their lives and to provide healthy techniques for coping with it.  Learning Barriers/Preferences:  Learning Barriers/Preferences - 05/18/24 1519       Learning Barriers/Preferences   Learning Barriers Exercise Concerns   Prior fall, 11 seconds in single leg stand   Learning Preferences Written Material;Pictoral;Individual Instruction;Group Instruction          Education Topics:  Knowledge Questionnaire Score:  Knowledge Questionnaire Score - 05/18/24 1521       Knowledge Questionnaire Score   Pre Score 23/24          Core Components/Risk Factors/Patient Goals at Admission:  Personal Goals and Risk Factors at Admission - 05/18/24 1524       Core Components/Risk Factors/Patient  Goals on Admission   Heart Failure Yes    Intervention Provide a combined exercise and nutrition program that is supplemented with education, support and counseling about heart failure. Directed toward relieving symptoms such as shortness of breath, decreased exercise tolerance, and extremity edema.    Expected Outcomes Improve functional capacity of life;Short term: Attendance in program 2-3 days a week with increased exercise capacity. Reported lower sodium intake. Reported increased fruit and vegetable intake. Reports medication compliance.;Short term: Daily weights obtained and reported for increase. Utilizing diuretic protocols set by physician.;Long term: Adoption of self-care skills and reduction of barriers for early signs and symptoms recognition and intervention leading to self-care maintenance.    Hypertension Yes    Intervention Provide education on lifestyle modifcations including regular physical activity/exercise, weight management, moderate sodium restriction and increased consumption of fresh fruit, vegetables, and low fat dairy, alcohol moderation, and smoking cessation.;Monitor prescription use compliance.    Expected Outcomes Short Term: Continued assessment and intervention until BP is < 140/48mm HG in hypertensive participants. < 130/7mm HG in hypertensive participants with diabetes, heart failure or chronic kidney disease.;Long Term: Maintenance of blood pressure at goal levels.    Lipids Yes    Intervention Provide education and support for participant on nutrition & aerobic/resistive exercise along with prescribed medications to achieve LDL 70mg , HDL >40mg .    Expected Outcomes Short Term: Participant states understanding of desired cholesterol values and is compliant with medications prescribed. Participant is following exercise prescription and nutrition guidelines.;Long Term: Cholesterol controlled with medications as prescribed, with individualized exercise RX and with  personalized nutrition plan. Value goals: LDL < 70mg , HDL > 40 mg.    Stress Yes    Intervention Offer individual and/or small group education and counseling on adjustment to heart disease, stress management and health-related lifestyle change.  Teach and support self-help strategies.;Refer participants experiencing significant psychosocial distress to appropriate mental health specialists for further evaluation and treatment. When possible, include family members and significant others in education/counseling sessions.    Expected Outcomes Short Term: Participant demonstrates changes in health-related behavior, relaxation and other stress management skills, ability to obtain effective social support, and compliance with psychotropic medications if prescribed.;Long Term: Emotional wellbeing is indicated by absence of clinically significant psychosocial distress or social isolation.    Personal Goal Other Yes    Personal Goal Goals: Incease strength and endurance, get back to the way she felt before, less fatigue    Intervention Will continue to monitor pt and progress workloads  as tolerated without sign or symptom    Expected Outcomes Pt will achieve her goals and increase strength          Core Components/Risk Factors/Patient Goals Review:   Goals and Risk Factor Review     Row Name 05/24/24 1643 06/16/24 0824           Core Components/Risk Factors/Patient Goals Review   Personal Goals Review Lipids;Hypertension;Stress;Weight Management/Obesity;Heart Failure Lipids;Hypertension;Stress;Weight Management/Obesity;Heart Failure      Review Geralyn started cardiac rehab today.  Pt did well with exercise, VSS. Ramah was off to a good start to exercise at  cardiac rehab today. Lonell has been out due to having a low grade fever. Lonell will follow up with her PCP Dr Shepard regarding this the week of 06/19/24.      Expected Outcomes Zyaire will continue to participate in cardiac rehab  for nutrition, exercise, and lifestyle modification. Tanita will continue to participate in cardiac rehab for nutrition, exercise, and lifestyle modification.         Core Components/Risk Factors/Patient Goals at Discharge (Final Review):   Goals and Risk Factor Review - 06/16/24 0824       Core Components/Risk Factors/Patient Goals Review   Personal Goals Review Lipids;Hypertension;Stress;Weight Management/Obesity;Heart Failure    Review Leeta was off to a good start to exercise at  cardiac rehab today. Lonell has been out due to having a low grade fever. Lonell will follow up with her PCP Dr Shepard regarding this the week of 06/19/24.    Expected Outcomes Ieesha will continue to participate in cardiac rehab for nutrition, exercise, and lifestyle modification.          ITP Comments:  ITP Comments     Row Name 05/18/24 1500 05/23/24 1746 05/24/24 1631 06/16/24 0821     ITP Comments Dr. Wilbert Bihari medical director. Introduction to pritikin education program/intensive cardiac rehab. Initial orientation packet reviewed with patient 30 Day ITP Review. Zairah will tenatively start exercise at cardiac rehab on 05/24/24. 30 Day ITP Review. Neylan started exercise today 05/24/24 at cardiac rehab.  Pt did well with exercise. 30 Day ITP Review. Jaedynn's  exercise cardiac rehab is currently on hold as Lonell has had a recent low grad fever after having dental work done.       Comments: See ITP comments.Hadassah Elpidio Quan RN BSN

## 2024-06-19 ENCOUNTER — Encounter (HOSPITAL_COMMUNITY)

## 2024-06-19 ENCOUNTER — Other Ambulatory Visit: Payer: Self-pay | Admitting: Internal Medicine

## 2024-06-19 DIAGNOSIS — R509 Fever, unspecified: Secondary | ICD-10-CM

## 2024-06-19 DIAGNOSIS — R0602 Shortness of breath: Secondary | ICD-10-CM

## 2024-06-21 ENCOUNTER — Encounter (HOSPITAL_COMMUNITY): Admission: RE | Admit: 2024-06-21 | Source: Ambulatory Visit

## 2024-06-21 ENCOUNTER — Ambulatory Visit
Admission: RE | Admit: 2024-06-21 | Discharge: 2024-06-21 | Disposition: A | Discharge status: Home / Self Care | Source: Ambulatory Visit | Attending: Internal Medicine | Admitting: Internal Medicine

## 2024-06-21 DIAGNOSIS — R509 Fever, unspecified: Secondary | ICD-10-CM

## 2024-06-21 DIAGNOSIS — R0602 Shortness of breath: Secondary | ICD-10-CM

## 2024-06-21 MED ORDER — IOPAMIDOL (ISOVUE-370) INJECTION 76%
75.0000 mL | Freq: Once | INTRAVENOUS | Status: AC | PRN
  Administered 2024-06-21 (×2): 75 mL via INTRAVENOUS

## 2024-06-22 ENCOUNTER — Other Ambulatory Visit

## 2024-06-23 ENCOUNTER — Encounter (HOSPITAL_COMMUNITY)

## 2024-06-26 ENCOUNTER — Encounter (HOSPITAL_COMMUNITY)
Admission: RE | Admit: 2024-06-26 | Discharge: 2024-06-26 | Disposition: A | Discharge status: Home / Self Care | Source: Ambulatory Visit | Attending: Cardiovascular Disease | Admitting: Cardiovascular Disease

## 2024-06-27 ENCOUNTER — Telehealth (HOSPITAL_COMMUNITY): Payer: Self-pay

## 2024-06-27 NOTE — Telephone Encounter (Signed)
 Called patient regarding no call, no show for 1:45 CR class yesterday. Patient states she is still sick and has been sick for a couple weeks now including a fever for 10 days. She states she has no energy and does not see herself coming to cardiac rehab in the near future. She is waiting for scan results to come in tomorrow from an appt she had last week. Patient confirmed she will not be in for class tomorrow, she believes she will be out a long time.

## 2024-06-28 ENCOUNTER — Encounter (HOSPITAL_COMMUNITY): Admission: RE | Admit: 2024-06-28 | Source: Ambulatory Visit

## 2024-06-30 ENCOUNTER — Encounter (HOSPITAL_COMMUNITY)

## 2024-07-03 ENCOUNTER — Encounter (HOSPITAL_COMMUNITY): Admission: RE | Admit: 2024-07-03 | Source: Ambulatory Visit

## 2024-07-04 ENCOUNTER — Telehealth (HOSPITAL_COMMUNITY): Payer: Self-pay | Admitting: *Deleted

## 2024-07-04 NOTE — Telephone Encounter (Signed)
 Left message to call cardiac rehab.Hadassah Elpidio Quan RN BSN

## 2024-07-05 ENCOUNTER — Encounter (HOSPITAL_COMMUNITY): Admission: RE | Admit: 2024-07-05 | Source: Ambulatory Visit

## 2024-07-05 ENCOUNTER — Telehealth (HOSPITAL_COMMUNITY): Payer: Self-pay | Admitting: *Deleted

## 2024-07-05 NOTE — Telephone Encounter (Signed)
 Spoke with Gina Harrell. Gina Harrell would like to be discharged from cardiac rehab at this time. Gina Harrell says she is having ongoing issues with her blood pressure.Hadassah Elpidio Quan RN BSN

## 2024-07-07 ENCOUNTER — Encounter (HOSPITAL_COMMUNITY)

## 2024-07-11 ENCOUNTER — Other Ambulatory Visit: Payer: Self-pay | Admitting: Physician Assistant

## 2024-07-11 ENCOUNTER — Telehealth: Payer: Self-pay | Admitting: Cardiovascular Disease

## 2024-07-11 MED ORDER — AMOXICILLIN 500 MG PO CAPS: 2000.0000 mg | ORAL_CAPSULE | ORAL | 5 refills | Status: AC

## 2024-07-11 NOTE — Telephone Encounter (Signed)
 I have sent a Rx of amoxillin to the pharmacy. 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 It should be taken as 4 capsules (2000mg  total) 1 hour prior to any dental procedure (cleaning, extraction or bridge). I am not sure why the patient took amoxicillin  for a week prior to the previous dental procedure, I am not aware it needs to be taken that long. Will attach our clinical pharmacist.  Elgene Coral

## 2024-07-11 NOTE — Telephone Encounter (Signed)
 Pt called in stating she needs an antibiotic because she is having a dental procedure (permanent dental bridge) on Thursday 9/4. She states last time she took amoxicillin  for a whole week before a dental procedure and she got sick. She asked if she should try this one again or if another can be sent. Please advise.   [id number] [place], [place] - [address] Phone: [phone]  Fax: [phone]

## 2024-07-11 NOTE — Telephone Encounter (Signed)
 Thanks [place] and [place], I have called and informed Mrs. Deere, she is aware of the renewed Rx and how to take it.  Mabell Esguerra

## 2024-07-12 ENCOUNTER — Encounter (HOSPITAL_COMMUNITY)

## 2024-07-14 ENCOUNTER — Encounter (HOSPITAL_COMMUNITY)

## 2024-07-17 ENCOUNTER — Encounter (HOSPITAL_COMMUNITY)

## 2024-07-19 ENCOUNTER — Encounter (HOSPITAL_COMMUNITY)

## 2024-07-20 ENCOUNTER — Encounter (HOSPITAL_COMMUNITY): Payer: Self-pay | Admitting: *Deleted

## 2024-07-20 DIAGNOSIS — Z952 Presence of prosthetic heart valve: Secondary | ICD-10-CM

## 2024-07-20 NOTE — Progress Notes (Signed)
 Gina Harrell attended 10 exercise and education classes between 05/18/24-06/05/24. Gina Harrell asked to be discharged due to ongoing medical issues.Gina Elpidio Quan RN BSN

## 2024-07-21 ENCOUNTER — Encounter (HOSPITAL_COMMUNITY)

## 2024-07-24 ENCOUNTER — Encounter (HOSPITAL_COMMUNITY)

## 2024-07-25 ENCOUNTER — Encounter: Payer: Self-pay | Admitting: Cardiovascular Disease

## 2024-07-25 ENCOUNTER — Ambulatory Visit: Attending: Cardiovascular Disease | Admitting: Cardiovascular Disease

## 2024-07-25 VITALS — BP 130/74 | HR 60 | Ht 59.0 in | Wt 107.9 lb

## 2024-07-25 DIAGNOSIS — E7800 Pure hypercholesterolemia, unspecified: Secondary | ICD-10-CM | POA: Diagnosis not present

## 2024-07-25 DIAGNOSIS — I700 Atherosclerosis of aorta: Secondary | ICD-10-CM

## 2024-07-25 DIAGNOSIS — F17200 Nicotine dependence, unspecified, uncomplicated: Secondary | ICD-10-CM

## 2024-07-25 DIAGNOSIS — I10 Essential (primary) hypertension: Secondary | ICD-10-CM | POA: Diagnosis not present

## 2024-07-25 DIAGNOSIS — I6522 Occlusion and stenosis of left carotid artery: Secondary | ICD-10-CM

## 2024-07-25 DIAGNOSIS — Z952 Presence of prosthetic heart valve: Secondary | ICD-10-CM | POA: Diagnosis not present

## 2024-07-25 NOTE — Patient Instructions (Signed)
 Medication Instructions:  No changes *If you need a refill on your cardiac medications before your next appointment, please call your pharmacy*  Lab Work: None ordered If you have labs (blood work) drawn today and your tests are completely normal, you will receive your results only by: MyChart Message (if you have MyChart) OR A paper copy in the mail If you have any lab test that is abnormal or we need to change your treatment, we will call you to review the results.  Testing/Procedures: Your physician has requested that you have an echocardiogram in July 2026. Echocardiography is a painless test that uses sound waves to create images of your heart. It provides your doctor with information about the size and shape of your heart and how well your heart's chambers and valves are working. This procedure takes approximately one hour. There are no restrictions for this procedure. Please do NOT wear cologne, perfume, aftershave, or lotions (deodorant is allowed). Please arrive 15 minutes prior to your appointment time.  Please note: We ask at that you not bring children with you during ultrasound (echo/ vascular) testing. Due to room size and safety concerns, children are not allowed in the ultrasound rooms during exams. Our front office staff cannot provide observation of children in our lobby area while testing is being conducted. An adult accompanying a patient to their appointment will only be allowed in the ultrasound room at the discretion of the ultrasound technician under special circumstances. We apologize for any inconvenience.   Follow-Up: At [facility], you and your health needs are our priority.  As part of our continuing mission to provide you with exceptional heart care, our providers are all part of one team.  This team includes your primary Cardiologist (physician) and Advanced Practice Providers or APPs (Physician Assistants and Nurse Practitioners) who all work together to  provide you with the care you need, when you need it.  Your next appointment:   1 year   Provider:   Jerel Balding, MD    We recommend signing up for the patient portal called MyChart.  Sign up information is provided on this After Visit Summary.  MyChart is used to connect with patients for Virtual Visits (Telemedicine).  Patients are able to view lab/test results, encounter notes, upcoming appointments, etc.  Non-urgent messages can be sent to your provider as well.   To learn more about what you can do with MyChart, go to [email or web address].

## 2024-07-26 ENCOUNTER — Encounter (HOSPITAL_COMMUNITY)

## 2024-07-27 ENCOUNTER — Other Ambulatory Visit: Payer: Self-pay | Admitting: Internal Medicine

## 2024-07-27 DIAGNOSIS — Z1231 Encounter for screening mammogram for malignant neoplasm of breast: Secondary | ICD-10-CM

## 2024-07-27 DIAGNOSIS — I6522 Occlusion and stenosis of left carotid artery: Secondary | ICD-10-CM | POA: Insufficient documentation

## 2024-07-27 NOTE — Progress Notes (Signed)
 Patient ID: Gina Harrell, female   DOB: 11-23-1940, 83 y.o.   MRN: [id number]     Cardiology Office Note    Date:  07/27/2024   ID:  Gina Harrell, DOB 1940-11-15, MRN [id number]  PCP:  Shepard Ade, MD  Cardiologist:   Jerel Balding, MD   No chief complaint on file.    History of Present Illness:  Gina Harrell is a 83 y.o. 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    She has been doing very well.  She can perform all her usual activities including climbing stairs to her third story apartment without shortness of breath or chest pain.  She has not had any groin problems at the access site.  She has excellent gradients across the TAVR prosthesis (mean gradient 7 mmHg).  There does appear to be a mild jet of aortic insufficiency that is probably perivalvular at the posterior rim of the aortic annulus  Pre-TAVR cardiac catheterization 01/31/2024 showed moderate nonobstructive CAD (proximal LAD 50%, proximal RCA 50%).  The aorta was normal in caliber and the iliofemoral circulation was reasonably disease-free.  Her most recent carotid Doppler shows stable findings with 40-59% left carotid stenosis.    Unfortunately she continues to smoke about 8 cigarettes a day.  Lipid parameters are excellent with a recent LDL cholesterol of 66.  She does not have diabetes mellitus and she has normal renal function parameters.  Is no longer working regularly at [employer], but helps out from time to time.   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 Wendel and Dr. Shyrl.   Severe aortic stenosis    Uterine prolapse     Past Surgical History:  Procedure Laterality Date   BLADDER TUMOR EXCISION     BREAST EXCISIONAL BIOPSY Right    CAROTID DOPPLER  2013   COLONOSCOPY WITH PROPOFOL  N/A 02/17/2016   Procedure: COLONOSCOPY WITH PROPOFOL ;  Surgeon: Gladis MARLA Louder, MD;  Location: [facility];  Service: Endoscopy;  Laterality: N/A;   CORONARY PRESSURE/FFR WITH 3D MAPPING N/A 01/31/2024   Procedure: Coronary Pressure/FFR w/3D Mapping;  Surgeon: Wendel Lurena MARLA, MD;  Location: [facility];  Service: Cardiovascular;  Laterality: N/A;   DOPPLER ECHOCARDIOGRAPHY  2013   ESOPHAGOGASTRODUODENOSCOPY (EGD) WITH PROPOFOL  N/A 02/17/2016   Procedure: ESOPHAGOGASTRODUODENOSCOPY (EGD) WITH PROPOFOL ;  Surgeon: Gladis MARLA Louder, MD;  Location: [facility];  Service: Endoscopy;  Laterality: N/A;   INTRAOPERATIVE TRANSTHORACIC ECHOCARDIOGRAM N/A 04/18/2024   Procedure: ECHOCARDIOGRAM, TRANSTHORACIC;  Surgeon: Wendel Lurena MARLA, MD;  Location: [facility];  Service: Cardiovascular;  Laterality: N/A;   RIGHT/LEFT HEART CATH AND CORONARY ANGIOGRAPHY N/A 01/31/2024   Procedure: RIGHT/LEFT HEART CATH AND CORONARY ANGIOGRAPHY;  Surgeon: Wendel Lurena MARLA, MD;  Location: [facility];  Service: Cardiovascular;  Laterality: N/A;   VAGINAL HYSTERECTOMY      Current Medications: Outpatient Medications Prior to Visit  Medication Sig Dispense Refill   ALPRAZolam  (XANAX ) 0.5 MG tablet Take 0.5 mg by  mouth at bedtime.      aspirin  81 MG chewable tablet Chew 1 tablet (81 mg total) by mouth daily.     Cholecalciferol 50 MCG (2000 UT) CAPS Take 2,000 Units by mouth daily.     CRANBERRY EXTRACT PO Take 2 capsules by mouth daily.     diphenhydramine-acetaminophen  (TYLENOL  PM) 25-500 MG TABS Take 2 tablets by mouth at bedtime. Reported on 12/26/2015     escitalopram  (LEXAPRO ) 10 MG tablet Take 5  mg by mouth at bedtime.     levothyroxine  (SYNTHROID , LEVOTHROID) 50 MCG tablet Take 50 mcg by mouth daily before breakfast.     losartan  (COZAAR ) 50 MG tablet Take 1 tablet (50 mg total) by mouth daily. 90 tablet 3   rosuvastatin  (CRESTOR ) 20 MG tablet Take 20 mg by mouth daily.     albuterol  (VENTOLIN  HFA) 108 (90 Base) MCG/ACT inhaler Inhale 1 puff into the lungs every 4 (four) hours as needed for shortness of breath. (Patient not taking: Reported on 07/25/2024)     amoxicillin  (AMOXIL ) 500 MG capsule Take 4 capsules (2,000 mg total) by mouth See admin instructions. Take 1 hour prior to dental visit (Patient not taking: Reported on 07/25/2024) 4 capsule 5   furosemide  (LASIX ) 20 MG tablet Take 1 tablet (20 mg total) by mouth as needed for fluid or edema. (Patient not taking: Reported on 07/25/2024) 60 tablet 11   No facility-administered medications prior to visit.     Allergies:   Alendronate sodium and Zoledronic  acid   Family History:  The patient's  family history includes Breast cancer in her maternal aunt; Cancer in her mother; Diabetes in her maternal grandmother; Heart disease in her father; Hypertension in her mother.   ROS:   Please see the history of present illness.    All other systems are reviewed and are negative.   PHYSICAL EXAM:   VS:  BP 130/74 (BP Location: Left Arm, Patient Position: Sitting, Cuff Size: Normal)   Pulse 60   Ht 4' 11 (1.499 m)   Wt 107 lb 14.4 oz (48.9 kg)   SpO2 96%   BMI 21.79 kg/m       General: Alert, oriented x3, no distress, very slender.  Appears fit and younger than stated age. Head: no evidence of trauma, PERRL, EOMI, no exophtalmos or lid lag, no myxedema, no xanthelasma; normal ears, nose and oropharynx Neck: normal jugular venous pulsations and no hepatojugular reflux; brisk carotid pulses without delay and no carotid bruits Chest: clear to auscultation, no signs of consolidation by percussion or palpation, normal fremitus, symmetrical  and full respiratory excursions Cardiovascular: normal position and quality of the apical impulse, regular rhythm, normal first and second heart sounds, 2/6 aortic systolic ejection murmur is early peaking, no diastolic murmurs, rubs or gallops Abdomen: no tenderness or distention, no masses by palpation, no abnormal pulsatility or arterial bruits, normal bowel sounds, no hepatosplenomegaly Extremities: no clubbing, cyanosis or edema; 2+ radial, ulnar and brachial pulses bilaterally; 2+ right femoral, posterior tibial and dorsalis pedis pulses; 2+ left femoral, posterior tibial and dorsalis pedis pulses; no subclavian or femoral bruits Neurological: grossly nonfocal Psych: Normal mood and affect    Wt Readings from Last 3 Encounters:  07/25/24 107 lb 14.4 oz (48.9 kg)  06/13/24 110 lb 3.7 oz (50 kg)  06/01/24 111 lb 12.8 oz (50.7 kg)    Studies/Labs Reviewed:   ECHO 06/01/2024  1. Left ventricular ejection fraction, by estimation, is 60 to 65%. The  left  ventricle has normal function. The left ventricle has no regional  wall motion abnormalities. Left ventricular diastolic parameters are  consistent with Grade I diastolic  dysfunction (impaired relaxation).   2. Right ventricular systolic function is normal. The right ventricular  size is normal.   3. . The mitral valve is normal in structure. Trivial mitral valve  regurgitation. No evidence of mitral stenosis.   4. Trivial perivalvular leak along the aspect of the anterior leaflet of  mitral valve. . The aortic valve has been repaired/replaced. Aortic valve  regurgitation is not visualized. No aortic stenosis is present. There is a  23 mm Sapien prosthetic (TAVR)  valve present in the aortic position. Echo findings are consistent with  perivalvular leak of the aortic prosthesis. Aortic valve mean gradient  measures 7.3 mmHg. Aortic valve Vmax measures 1.88 m/s.   5. The inferior vena cava is normal in size with greater than 50%   respiratory variability, suggesting right atrial pressure of 3 mmHg.    AV Vmax:           188.00 cm/s  AV Peak Grad:      14.1 mmHg  AV Mean Grad:      7.3 mmHg  LVOT/AV VTI ratio: 0.57  AI PHT:            720 msec         Carotid Doppler 07/26/2023: Right Carotid: Velocities in the right ICA are consistent with a 1-39%  stenosis.   Left Carotid: Velocities in the left ICA are consistent with a 40-59%  stenosis. Non-hemodynamically significant plaque <50% noted in the  CCA. LICA  stenosis based on peak systolic velocities.   Vertebrals:  Bilateral vertebral arteries demonstrate antegrade flow.  Subclavians: Normal flow hemodynamics were seen in bilateral subclavian  arteries.    EKG: Personally reviewed the most recent ECG tracing from 04/24/2024 which shows sinus bradycardia and is otherwise a normal tracing  EKG Interpretation Date/Time:    Ventricular Rate:    PR Interval:    QRS Duration:    QT Interval:    QTC Calculation:   R Axis:      Text Interpretation:            Latest Ref Rng & Units 06/14/2024    3:38 AM 06/13/2024    5:52 PM 04/19/2024    3:11 AM  BMP  Glucose 70 - 99 mg/dL 889  896  98   BUN 8 - 23 mg/dL 21  23  18    Creatinine 0.44 - 1.00 mg/dL 9.01  8.54  9.10   Sodium 135 - 145 mmol/L 133  132  136   Potassium 3.5 - 5.1 mmol/L 3.6  4.1  3.9   Chloride 98 - 111 mmol/L 103  101  109   CO2 22 - 32 mmol/L 18  18  20    Calcium  8.9 - 10.3 mg/dL 8.5  9.0  8.6     CBC:    Component Value Date/Time   WBC 5.6 06/13/2024 1752   HGB 14.5 06/13/2024 1752   HGB 15.2 01/03/2024 1128   HCT 45.2 06/13/2024 1752   HCT 45.3 01/03/2024 1128   PLT 128 (L) 06/13/2024 1752   PLT 152 01/03/2024 1128   MCV 92.1 06/13/2024 1752   MCV 90 01/03/2024 1128   NEUTROABS 4.0 06/13/2024 1752   LYMPHSABS 0.7 06/13/2024 1752   MONOABS 0.8 06/13/2024 1752   EOSABS 0.1 06/13/2024 1752   BASOSABS 0.1 06/13/2024 1752  Lipid Panel  No results found for: CHOL,  TRIG, HDL, CHOLHDL, VLDL, LDLCALC, LDLDIRECT, LABVLDL   Recent Labs: February 28, 2020  creatinine 1.2,  hemoglobin 15.3 normal liver function tests cholesterol 148, triglycerides 56, LDL 78, HDL 59.  03/18/2022 Cholesterol 113, HDL 44, LDL 49, triglycerides 101 Hemoglobin 13.4, creatinine 1.2, potassium 3.8, ALT 22, TSH 1.29  04/01/2023 Cholesterol 159, HDL 63, LDL 76, triglycerides 111  ASSESSMENT:    1. S/P TAVR (transcatheter aortic valve replacement)   2. Carotid stenosis, asymptomatic, left   3. Essential hypertension   4. Hypercholesteremia   5. Aortic atherosclerosis (HCC)   6. Smoker     PLAN:  In order of problems listed above:  AS s/p TAVR: Good result following TAVR.  Currently asymptomatic.  Aware of the need for endocarditis prophylaxis. Carotid stenosis: Carotid Doppler findings have been very stable over the last 4 years.  No neurological complaints.  If she stops smoking, I think we can increase the period between carotid studies to every 3 years.  However, if not all her risk factors are well addressed, recommend continuing yearly carotid duplex ultrasound. HTN: Adequate control.  Continue same medications. HLP: On statin, labs followed by PCP.  Target LDL less than 70. Aortic atherosclerosis: Also with extensive plaque in the visceral abdominal arteries and in the iliac system, but without any significant stenoses.  Normal caliber aorta. Raynaud's syndrome: Has not had any symptoms in years. Smoking:  once again strongly recommend complete and permanent smoking cessation   Medication Adjustments/Labs and Tests Ordered: Current medicines are reviewed at length with the patient today.  Concerns regarding medicines are outlined above.  Medication changes, Labs and Tests ordered today are listed in the Patient Instructions below. Patient Instructions  Medication Instructions:  No changes *If you need a refill on your cardiac medications before your  next appointment, please call your pharmacy*  Lab Work: None ordered If you have labs (blood work) drawn today and your tests are completely normal, you will receive your results only by: MyChart Message (if you have MyChart) OR A paper copy in the mail If you have any lab test that is abnormal or we need to change your treatment, we will call you to review the results.  Testing/Procedures: Your physician has requested that you have an echocardiogram in July 2026. Echocardiography is a painless test that uses sound waves to create images of your heart. It provides your doctor with information about the size and shape of your heart and how well your heart's chambers and valves are working. This procedure takes approximately one hour. There are no restrictions for this procedure. Please do NOT wear cologne, perfume, aftershave, or lotions (deodorant is allowed). Please arrive 15 minutes prior to your appointment time.  Please note: We ask at that you not bring children with you during ultrasound (echo/ vascular) testing. Due to room size and safety concerns, children are not allowed in the ultrasound rooms during exams. Our front office staff cannot provide observation of children in our lobby area while testing is being conducted. An adult accompanying a patient to their appointment will only be allowed in the ultrasound room at the discretion of the ultrasound technician under special circumstances. We apologize for any inconvenience.   Follow-Up: At [facility], you and your health needs are our priority.  As part of our continuing mission to provide you with exceptional heart care, our providers are all part of one team.  This team includes your  primary Cardiologist (physician) and Advanced Practice Providers or APPs (Physician Assistants and Nurse Practitioners) who all work together to provide you with the care you need, when you need it.  Your next appointment:   1 year    Provider:   Jerel Balding, MD    We recommend signing up for the patient portal called MyChart.  Sign up information is provided on this After Visit Summary.  MyChart is used to connect with patients for Virtual Visits (Telemedicine).  Patients are able to view lab/test results, encounter notes, upcoming appointments, etc.  Non-urgent messages can be sent to your provider as well.   To learn more about what you can do with MyChart, go to [email or web address].         Signed, Jerel Balding, MD  07/27/2024 3:08 PM    [facility] [address] [place], [place], [place]  [address] Phone: [phone]; Fax: [phone]

## 2024-07-28 ENCOUNTER — Encounter (HOSPITAL_COMMUNITY)

## 2024-07-31 ENCOUNTER — Encounter (HOSPITAL_COMMUNITY)

## 2024-08-02 ENCOUNTER — Encounter (HOSPITAL_COMMUNITY)

## 2024-08-04 ENCOUNTER — Encounter (HOSPITAL_COMMUNITY)

## 2024-08-07 ENCOUNTER — Encounter (HOSPITAL_COMMUNITY)

## 2024-08-09 ENCOUNTER — Encounter (HOSPITAL_COMMUNITY)

## 2024-08-14 ENCOUNTER — Ambulatory Visit
Admission: RE | Admit: 2024-08-14 | Discharge: 2024-08-14 | Disposition: A | Source: Ambulatory Visit | Attending: Internal Medicine | Admitting: Internal Medicine

## 2024-08-14 DIAGNOSIS — Z1231 Encounter for screening mammogram for malignant neoplasm of breast: Secondary | ICD-10-CM

## 2024-08-16 ENCOUNTER — Ambulatory Visit: Payer: Self-pay | Admitting: Cardiovascular Disease

## 2024-08-21 ENCOUNTER — Ambulatory Visit (HOSPITAL_COMMUNITY)
Admission: RE | Admit: 2024-08-21 | Discharge: 2024-08-21 | Disposition: A | Discharge status: Home / Self Care | Payer: Medicare Other | Source: Ambulatory Visit | Attending: Physician Assistant | Admitting: Physician Assistant

## 2024-08-21 DIAGNOSIS — I6522 Occlusion and stenosis of left carotid artery: Secondary | ICD-10-CM | POA: Diagnosis present

## 2024-08-21 DIAGNOSIS — I700 Atherosclerosis of aorta: Secondary | ICD-10-CM | POA: Insufficient documentation

## 2024-08-23 ENCOUNTER — Ambulatory Visit: Payer: Self-pay | Admitting: Physician Assistant

## 2025-05-23 ENCOUNTER — Other Ambulatory Visit (HOSPITAL_COMMUNITY)
